# Patient Record
Sex: Female | Born: 1935 | Race: White | Hispanic: No | Smoking: Never smoker
Health system: Southern US, Community
[De-identification: ages and names within clinical notes are randomized; demographics above are authoritative.]

## PROBLEM LIST (undated history)

## (undated) DIAGNOSIS — N183 Chronic kidney disease, stage 3 unspecified: Secondary | ICD-10-CM

## (undated) DIAGNOSIS — N289 Disorder of kidney and ureter, unspecified: Secondary | ICD-10-CM

## (undated) DIAGNOSIS — K589 Irritable bowel syndrome without diarrhea: Secondary | ICD-10-CM

## (undated) DIAGNOSIS — I10 Essential (primary) hypertension: Secondary | ICD-10-CM

## (undated) DIAGNOSIS — E079 Disorder of thyroid, unspecified: Secondary | ICD-10-CM

## (undated) DIAGNOSIS — K5792 Diverticulitis of intestine, part unspecified, without perforation or abscess without bleeding: Secondary | ICD-10-CM

## (undated) DIAGNOSIS — I2510 Atherosclerotic heart disease of native coronary artery without angina pectoris: Secondary | ICD-10-CM

## (undated) DIAGNOSIS — D649 Anemia, unspecified: Secondary | ICD-10-CM

## (undated) DIAGNOSIS — K219 Gastro-esophageal reflux disease without esophagitis: Secondary | ICD-10-CM

## (undated) DIAGNOSIS — G4730 Sleep apnea, unspecified: Secondary | ICD-10-CM

## (undated) DIAGNOSIS — I5032 Chronic diastolic (congestive) heart failure: Secondary | ICD-10-CM

## (undated) DIAGNOSIS — N1330 Unspecified hydronephrosis: Secondary | ICD-10-CM

## (undated) DIAGNOSIS — K5790 Diverticulosis of intestine, part unspecified, without perforation or abscess without bleeding: Secondary | ICD-10-CM

## (undated) HISTORY — PX: COLON SURGERY: SHX602

## (undated) HISTORY — PX: PELVIC FLOOR REPAIR: SHX2192

## (undated) HISTORY — DX: Diverticulosis of intestine, part unspecified, without perforation or abscess without bleeding: K57.90

## (undated) HISTORY — PX: HEMORRHOID SURGERY: SHX153

## (undated) HISTORY — DX: Unspecified hydronephrosis: N13.30

## (undated) HISTORY — DX: Atherosclerotic heart disease of native coronary artery without angina pectoris: I25.10

## (undated) HISTORY — DX: Chronic diastolic (congestive) heart failure: I50.32

---

## 1965-08-30 HISTORY — PX: HEMORRHOID SURGERY: SHX153

## 1969-08-30 HISTORY — PX: ABDOMINAL HYSTERECTOMY: SHX81

## 2007-07-23 DIAGNOSIS — D519 Vitamin B12 deficiency anemia, unspecified: Secondary | ICD-10-CM | POA: Insufficient documentation

## 2008-04-03 DIAGNOSIS — E785 Hyperlipidemia, unspecified: Secondary | ICD-10-CM | POA: Insufficient documentation

## 2008-08-30 HISTORY — PX: COLON SURGERY: SHX602

## 2012-03-14 DIAGNOSIS — N8110 Cystocele, unspecified: Secondary | ICD-10-CM | POA: Insufficient documentation

## 2013-03-06 DIAGNOSIS — F411 Generalized anxiety disorder: Secondary | ICD-10-CM | POA: Insufficient documentation

## 2013-03-23 DIAGNOSIS — M545 Low back pain, unspecified: Secondary | ICD-10-CM | POA: Insufficient documentation

## 2014-01-06 DIAGNOSIS — M4802 Spinal stenosis, cervical region: Secondary | ICD-10-CM | POA: Insufficient documentation

## 2014-12-03 DIAGNOSIS — E034 Atrophy of thyroid (acquired): Secondary | ICD-10-CM | POA: Insufficient documentation

## 2015-09-18 DIAGNOSIS — N390 Urinary tract infection, site not specified: Secondary | ICD-10-CM

## 2015-09-18 DIAGNOSIS — K219 Gastro-esophageal reflux disease without esophagitis: Secondary | ICD-10-CM

## 2015-09-18 DIAGNOSIS — I10 Essential (primary) hypertension: Secondary | ICD-10-CM | POA: Insufficient documentation

## 2015-09-18 DIAGNOSIS — E538 Deficiency of other specified B group vitamins: Secondary | ICD-10-CM | POA: Insufficient documentation

## 2015-09-18 HISTORY — DX: Urinary tract infection, site not specified: N39.0

## 2015-10-06 DIAGNOSIS — E039 Hypothyroidism, unspecified: Secondary | ICD-10-CM

## 2015-10-06 HISTORY — DX: Hypothyroidism, unspecified: E03.9

## 2016-02-22 DIAGNOSIS — I160 Hypertensive urgency: Secondary | ICD-10-CM

## 2016-02-22 HISTORY — DX: Hypertensive urgency: I16.0

## 2016-02-23 DIAGNOSIS — K5790 Diverticulosis of intestine, part unspecified, without perforation or abscess without bleeding: Secondary | ICD-10-CM | POA: Insufficient documentation

## 2016-02-23 DIAGNOSIS — N183 Chronic kidney disease, stage 3 unspecified: Secondary | ICD-10-CM | POA: Diagnosis present

## 2016-02-23 DIAGNOSIS — R1084 Generalized abdominal pain: Secondary | ICD-10-CM | POA: Insufficient documentation

## 2016-02-23 DIAGNOSIS — R072 Precordial pain: Secondary | ICD-10-CM

## 2016-02-23 DIAGNOSIS — R42 Dizziness and giddiness: Secondary | ICD-10-CM | POA: Insufficient documentation

## 2016-02-23 HISTORY — DX: Precordial pain: R07.2

## 2019-08-03 ENCOUNTER — Emergency Department (HOSPITAL_COMMUNITY): Payer: Medicare Other

## 2019-08-03 ENCOUNTER — Inpatient Hospital Stay (HOSPITAL_COMMUNITY)
Admission: EM | Admit: 2019-08-03 | Discharge: 2019-08-07 | DRG: 392 | Disposition: A | Discharge status: Home / Self Care | Payer: Medicare Other | Attending: Family Medicine | Admitting: Family Medicine

## 2019-08-03 ENCOUNTER — Other Ambulatory Visit: Payer: Self-pay

## 2019-08-03 ENCOUNTER — Encounter (HOSPITAL_COMMUNITY): Payer: Self-pay | Admitting: Emergency Medicine

## 2019-08-03 DIAGNOSIS — Z20828 Contact with and (suspected) exposure to other viral communicable diseases: Secondary | ICD-10-CM | POA: Diagnosis present

## 2019-08-03 DIAGNOSIS — K219 Gastro-esophageal reflux disease without esophagitis: Secondary | ICD-10-CM | POA: Diagnosis not present

## 2019-08-03 DIAGNOSIS — N1832 Chronic kidney disease, stage 3b: Secondary | ICD-10-CM | POA: Diagnosis not present

## 2019-08-03 DIAGNOSIS — Z90710 Acquired absence of both cervix and uterus: Secondary | ICD-10-CM

## 2019-08-03 DIAGNOSIS — K5732 Diverticulitis of large intestine without perforation or abscess without bleeding: Secondary | ICD-10-CM | POA: Diagnosis not present

## 2019-08-03 DIAGNOSIS — E079 Disorder of thyroid, unspecified: Secondary | ICD-10-CM

## 2019-08-03 DIAGNOSIS — K5792 Diverticulitis of intestine, part unspecified, without perforation or abscess without bleeding: Secondary | ICD-10-CM | POA: Diagnosis not present

## 2019-08-03 DIAGNOSIS — E039 Hypothyroidism, unspecified: Secondary | ICD-10-CM | POA: Diagnosis present

## 2019-08-03 DIAGNOSIS — Z79890 Hormone replacement therapy (postmenopausal): Secondary | ICD-10-CM

## 2019-08-03 DIAGNOSIS — N390 Urinary tract infection, site not specified: Secondary | ICD-10-CM | POA: Diagnosis present

## 2019-08-03 DIAGNOSIS — I129 Hypertensive chronic kidney disease with stage 1 through stage 4 chronic kidney disease, or unspecified chronic kidney disease: Secondary | ICD-10-CM | POA: Diagnosis present

## 2019-08-03 DIAGNOSIS — N183 Chronic kidney disease, stage 3 unspecified: Secondary | ICD-10-CM | POA: Diagnosis present

## 2019-08-03 DIAGNOSIS — K589 Irritable bowel syndrome without diarrhea: Secondary | ICD-10-CM | POA: Diagnosis present

## 2019-08-03 DIAGNOSIS — I10 Essential (primary) hypertension: Secondary | ICD-10-CM

## 2019-08-03 DIAGNOSIS — R1030 Lower abdominal pain, unspecified: Secondary | ICD-10-CM | POA: Diagnosis not present

## 2019-08-03 HISTORY — DX: Chronic kidney disease, stage 3 unspecified: N18.30

## 2019-08-03 HISTORY — DX: Irritable bowel syndrome, unspecified: K58.9

## 2019-08-03 HISTORY — DX: Sleep apnea, unspecified: G47.30

## 2019-08-03 HISTORY — DX: Gastro-esophageal reflux disease without esophagitis: K21.9

## 2019-08-03 HISTORY — DX: Disorder of thyroid, unspecified: E07.9

## 2019-08-03 HISTORY — DX: Diverticulitis of intestine, part unspecified, without perforation or abscess without bleeding: K57.92

## 2019-08-03 HISTORY — DX: Anemia, unspecified: D64.9

## 2019-08-03 HISTORY — DX: Disorder of kidney and ureter, unspecified: N28.9

## 2019-08-03 HISTORY — DX: Essential (primary) hypertension: I10

## 2019-08-03 LAB — URINALYSIS, ROUTINE W REFLEX MICROSCOPIC
Bacteria, UA: NONE SEEN
Bilirubin Urine: NEGATIVE
Glucose, UA: NEGATIVE mg/dL
Ketones, ur: NEGATIVE mg/dL
Nitrite: NEGATIVE
Protein, ur: NEGATIVE mg/dL
Specific Gravity, Urine: 1.005 (ref 1.005–1.030)
pH: 6 (ref 5.0–8.0)

## 2019-08-03 LAB — LIPASE, BLOOD: Lipase: 29 U/L (ref 11–51)

## 2019-08-03 LAB — TSH: TSH: 15.31 u[IU]/mL — ABNORMAL HIGH (ref 0.350–4.500)

## 2019-08-03 LAB — CBC WITH DIFFERENTIAL/PLATELET
Abs Immature Granulocytes: 0.02 10*3/uL (ref 0.00–0.07)
Basophils Absolute: 0 10*3/uL (ref 0.0–0.1)
Basophils Relative: 0 %
Eosinophils Absolute: 0 10*3/uL (ref 0.0–0.5)
Eosinophils Relative: 0 %
HCT: 44.5 % (ref 36.0–46.0)
Hemoglobin: 13.5 g/dL (ref 12.0–15.0)
Immature Granulocytes: 0 %
Lymphocytes Relative: 15 %
Lymphs Abs: 1.3 10*3/uL (ref 0.7–4.0)
MCH: 28.1 pg (ref 26.0–34.0)
MCHC: 30.3 g/dL (ref 30.0–36.0)
MCV: 92.7 fL (ref 80.0–100.0)
Monocytes Absolute: 0.6 10*3/uL (ref 0.1–1.0)
Monocytes Relative: 7 %
Neutro Abs: 7.1 10*3/uL (ref 1.7–7.7)
Neutrophils Relative %: 78 %
Platelets: 280 10*3/uL (ref 150–400)
RBC: 4.8 MIL/uL (ref 3.87–5.11)
RDW: 13.7 % (ref 11.5–15.5)
WBC: 9.2 10*3/uL (ref 4.0–10.5)
nRBC: 0 % (ref 0.0–0.2)

## 2019-08-03 LAB — COMPREHENSIVE METABOLIC PANEL
ALT: 16 U/L (ref 0–44)
AST: 23 U/L (ref 15–41)
Albumin: 4.1 g/dL (ref 3.5–5.0)
Alkaline Phosphatase: 71 U/L (ref 38–126)
Anion gap: 12 (ref 5–15)
BUN: 17 mg/dL (ref 8–23)
CO2: 24 mmol/L (ref 22–32)
Calcium: 9.4 mg/dL (ref 8.9–10.3)
Chloride: 99 mmol/L (ref 98–111)
Creatinine, Ser: 1.34 mg/dL — ABNORMAL HIGH (ref 0.44–1.00)
GFR calc Af Amer: 43 mL/min — ABNORMAL LOW (ref >60)
GFR calc non Af Amer: 37 mL/min — ABNORMAL LOW (ref >60)
Glucose, Bld: 118 mg/dL — ABNORMAL HIGH (ref 70–99)
Potassium: 3.6 mmol/L (ref 3.5–5.1)
Sodium: 135 mmol/L (ref 135–145)
Total Bilirubin: 0.9 mg/dL (ref 0.3–1.2)
Total Protein: 8 g/dL (ref 6.5–8.1)

## 2019-08-03 LAB — MAGNESIUM: Magnesium: 2.1 mg/dL (ref 1.7–2.4)

## 2019-08-03 LAB — PHOSPHORUS: Phosphorus: 3.2 mg/dL (ref 2.5–4.6)

## 2019-08-03 MED ORDER — MORPHINE SULFATE (PF) 2 MG/ML IV SOLN
2.0000 mg | Freq: Once | INTRAVENOUS | Status: AC
  Administered 2019-08-03: 2 mg via INTRAVENOUS
  Filled 2019-08-03: qty 1

## 2019-08-03 MED ORDER — LOSARTAN POTASSIUM 50 MG PO TABS
50.0000 mg | ORAL_TABLET | Freq: Two times a day (BID) | ORAL | Status: DC
  Administered 2019-08-04 – 2019-08-07 (×6): 50 mg via ORAL
  Filled 2019-08-03 (×8): qty 1

## 2019-08-03 MED ORDER — FAMOTIDINE 20 MG PO TABS
20.0000 mg | ORAL_TABLET | Freq: Every day | ORAL | Status: DC
  Administered 2019-08-03 – 2019-08-06 (×4): 20 mg via ORAL
  Filled 2019-08-03 (×4): qty 1

## 2019-08-03 MED ORDER — POTASSIUM CHLORIDE IN NACL 20-0.9 MEQ/L-% IV SOLN
INTRAVENOUS | Status: DC
  Administered 2019-08-03 – 2019-08-04 (×3): via INTRAVENOUS

## 2019-08-03 MED ORDER — SODIUM CHLORIDE 0.9 % IV BOLUS
500.0000 mL | Freq: Once | INTRAVENOUS | Status: AC
  Administered 2019-08-03: 500 mL via INTRAVENOUS

## 2019-08-03 MED ORDER — ENOXAPARIN SODIUM 30 MG/0.3ML ~~LOC~~ SOLN
30.0000 mg | SUBCUTANEOUS | Status: DC
  Administered 2019-08-03: 30 mg via SUBCUTANEOUS
  Filled 2019-08-03: qty 0.3

## 2019-08-03 MED ORDER — LEVOTHYROXINE SODIUM 88 MCG PO TABS
88.0000 ug | ORAL_TABLET | Freq: Every day | ORAL | Status: DC
  Administered 2019-08-04: 88 ug via ORAL
  Filled 2019-08-03: qty 1

## 2019-08-03 MED ORDER — METRONIDAZOLE IN NACL 5-0.79 MG/ML-% IV SOLN
500.0000 mg | Freq: Once | INTRAVENOUS | Status: AC
  Administered 2019-08-03: 500 mg via INTRAVENOUS
  Filled 2019-08-03: qty 100

## 2019-08-03 MED ORDER — FENTANYL CITRATE (PF) 100 MCG/2ML IJ SOLN
50.0000 ug | Freq: Once | INTRAMUSCULAR | Status: AC
  Administered 2019-08-03: 50 ug via INTRAVENOUS
  Filled 2019-08-03: qty 2

## 2019-08-03 MED ORDER — IOHEXOL 9 MG/ML PO SOLN
ORAL | Status: AC
  Filled 2019-08-03: qty 1000

## 2019-08-03 MED ORDER — SENNOSIDES-DOCUSATE SODIUM 8.6-50 MG PO TABS
1.0000 | ORAL_TABLET | Freq: Two times a day (BID) | ORAL | Status: DC
  Filled 2019-08-03 (×9): qty 1

## 2019-08-03 MED ORDER — SODIUM CHLORIDE 0.9 % IV SOLN: INTRAVENOUS | Status: DC

## 2019-08-03 MED ORDER — POLYETHYLENE GLYCOL 3350 17 G PO PACK
17.0000 g | PACK | Freq: Every day | ORAL | Status: DC
  Filled 2019-08-03 (×4): qty 1

## 2019-08-03 MED ORDER — ACETAMINOPHEN 650 MG RE SUPP: 650.0000 mg | Freq: Four times a day (QID) | RECTAL | Status: DC | PRN

## 2019-08-03 MED ORDER — ACETAMINOPHEN 325 MG PO TABS: 650.0000 mg | ORAL_TABLET | Freq: Four times a day (QID) | ORAL | Status: DC | PRN

## 2019-08-03 MED ORDER — SODIUM CHLORIDE 0.9 % IV SOLN
3.0000 g | Freq: Three times a day (TID) | INTRAVENOUS | Status: DC
  Administered 2019-08-03 – 2019-08-07 (×11): 3 g via INTRAVENOUS
  Filled 2019-08-03: qty 3
  Filled 2019-08-03 (×3): qty 8
  Filled 2019-08-03 (×2): qty 3
  Filled 2019-08-03 (×3): qty 8
  Filled 2019-08-03: qty 3
  Filled 2019-08-03: qty 8
  Filled 2019-08-03: qty 3
  Filled 2019-08-03 (×3): qty 8

## 2019-08-03 MED ORDER — CIPROFLOXACIN IN D5W 400 MG/200ML IV SOLN
400.0000 mg | Freq: Once | INTRAVENOUS | Status: AC
  Administered 2019-08-03: 400 mg via INTRAVENOUS
  Filled 2019-08-03: qty 200

## 2019-08-03 MED ORDER — DILTIAZEM HCL ER 120 MG PO CP24
120.0000 mg | ORAL_CAPSULE | Freq: Every day | ORAL | Status: DC
  Administered 2019-08-03 – 2019-08-06 (×4): 120 mg via ORAL
  Filled 2019-08-03 (×11): qty 1

## 2019-08-03 MED ORDER — MORPHINE SULFATE (PF) 2 MG/ML IV SOLN: 2.0000 mg | INTRAVENOUS | Status: DC | PRN

## 2019-08-03 MED ORDER — FENTANYL CITRATE (PF) 100 MCG/2ML IJ SOLN
50.0000 ug | INTRAMUSCULAR | Status: DC | PRN
  Administered 2019-08-03 – 2019-08-04 (×8): 50 ug via INTRAVENOUS
  Filled 2019-08-03 (×8): qty 2

## 2019-08-03 MED ORDER — ONDANSETRON HCL 4 MG/2ML IJ SOLN
4.0000 mg | Freq: Four times a day (QID) | INTRAMUSCULAR | Status: DC | PRN
  Administered 2019-08-03 – 2019-08-04 (×2): 4 mg via INTRAVENOUS
  Filled 2019-08-03 (×3): qty 2

## 2019-08-03 MED ORDER — ONDANSETRON HCL 4 MG/2ML IJ SOLN
4.0000 mg | Freq: Once | INTRAMUSCULAR | Status: AC
  Administered 2019-08-03: 4 mg via INTRAVENOUS
  Filled 2019-08-03: qty 2

## 2019-08-03 NOTE — ED Provider Notes (Signed)
[facility] EMERGENCY DEPARTMENT Provider Note   CSN: [id number] Arrival date & time: 08/03/19  [id number]     History   Chief Complaint Chief Complaint  Patient presents with   Abdominal Pain    HPI Kristy CosierJoan Patterson is a 83 y.o. female with a history of GERD, hypertension, IBS which is constipation predominant, stage III kidney disease and history of diverticulitis, surgical history significant for a rectal revision and hemorrhoid surgery presenting with a 1 week history of increased constipation and bilateral lower abdominal pain.  She reports her last bowel movement was 1 week ago.  She denies rectal pain or pressure but reports pressure across her bilateral lower abdomen with intermittent sharp stabs of pain radiating from right to left.  It became more intense last night, stating has a sharp stab occurring frequently and lasting about a minute.  She denies fevers or chills, she does have nausea without emesis.  She has had no p.o. intake in the past 24 hours due to lack of appetite.  She typically will take a dose of sorbitol as needed for constipation as recommended by her GI specialist in [place].  She has had multiple doses of this this week without any relief.     The history is provided by the patient.    Past Medical History:  Diagnosis Date   Diverticulitis    GERD (gastroesophageal reflux disease)    Hypertension    Irritable bowel syndrome (IBS)    Renal disorder    Sleep apnea    Stage 3 chronic kidney disease    Thyroid disease     There are no active problems to display for this patient.      OB History   No obstetric history on file.      Home Medications    Prior to Admission medications   Not on File    Family History No family history on file.  Social History Social History   Tobacco Use   Smoking status: Never Smoker   Smokeless tobacco: Never Used  Substance Use Topics   Alcohol use: Never    Frequency: Never   Drug use: Never       Allergies   Atropine, Amlodipine, and Compazine [prochlorperazine edisylate]   Review of Systems Review of Systems  Constitutional: Negative for chills and fever.  HENT: Negative for congestion and sore throat.   Eyes: Negative.   Respiratory: Negative for chest tightness and shortness of breath.   Cardiovascular: Negative for chest pain.  Gastrointestinal: Positive for abdominal pain, constipation and nausea. Negative for blood in stool and vomiting.  Genitourinary: Negative.  Negative for dysuria.  Musculoskeletal: Negative for arthralgias, joint swelling and neck pain.  Skin: Negative.  Negative for rash and wound.  Neurological: Negative for dizziness, weakness, light-headedness, numbness and headaches.  Psychiatric/Behavioral: Negative.      Physical Exam Updated Vital Signs BP (!) 172/70 (BP Location: Right Arm)    Pulse (!) 107    Temp 98.6 F (37 C) (Oral)    Resp 18    Ht 5\' 3"  (1.6 m)    Wt 75.8 kg    SpO2 98%    BMI 29.58 kg/m   Physical Exam Vitals signs and nursing note reviewed.  Constitutional:      Appearance: She is well-developed.  HENT:     Head: Normocephalic and atraumatic.  Eyes:     Conjunctiva/sclera: Conjunctivae normal.  Neck:     Musculoskeletal: Normal range of motion.  Cardiovascular:  Rate and Rhythm: Normal rate and regular rhythm.     Heart sounds: Normal heart sounds.  Pulmonary:     Effort: Pulmonary effort is normal.     Breath sounds: Normal breath sounds. No wheezing.  Abdominal:     General: Bowel sounds are normal. There is distension.     Palpations: Abdomen is soft.     Tenderness: There is abdominal tenderness in the right lower quadrant and left lower quadrant. There is no guarding or rebound.  Musculoskeletal: Normal range of motion.  Skin:    General: Skin is warm and dry.  Neurological:     Mental Status: She is alert.      ED Treatments / Results  Labs (all labs ordered are listed, but only abnormal  results are displayed) Labs Reviewed  COMPREHENSIVE METABOLIC PANEL - Abnormal; Notable for the following components:      Result Value   Glucose, Bld 118 (*)    Creatinine, Ser 1.34 (*)    GFR calc non Af Amer 37 (*)    GFR calc Af Amer 43 (*)    All other components within normal limits  URINALYSIS, ROUTINE W REFLEX MICROSCOPIC - Abnormal; Notable for the following components:   Hgb urine dipstick SMALL (*)    Leukocytes,Ua SMALL (*)    All other components within normal limits  LIPASE, BLOOD  CBC WITH DIFFERENTIAL/PLATELET    EKG None  Radiology Ct Abdomen Pelvis Wo Contrast  Result Date: 08/03/2019 CLINICAL DATA:  Lower abdominal pain and nausea for 1 week. Renal insufficiency. Constipation. History of pelvic floor repair and colonic surgery. EXAM: CT ABDOMEN AND PELVIS WITHOUT CONTRAST TECHNIQUE: Multidetector CT imaging of the abdomen and pelvis was performed following the standard protocol without IV contrast. COMPARISON:  Abdominal radiographs from earlier today. FINDINGS: Lower chest: No significant pulmonary nodules or acute consolidative airspace disease. Hepatobiliary: Normal liver size. No liver mass. Normal gallbladder with no radiopaque cholelithiasis. No biliary ductal dilatation. Pancreas: Normal, with no mass or duct dilation. Spleen: Normal size. No mass. Adrenals/Urinary Tract: Normal adrenals. No renal stones. No hydronephrosis. Small parapelvic renal cysts in the left kidney. Simple left renal cysts, largest an exophytic 2.7 cm simple medial upper left renal cyst. No additional contour deforming renal lesions. Normal bladder. Stomach/Bowel: Normal non-distended stomach. Normal caliber small bowel with no small bowel wall thickening. Normal appendix. Oral contrast transits to the left colon. Moderate diffuse colonic diverticulosis. There is a short segment eccentric wall thickening in mid to distal sigmoid colon with surrounding ill-defined fluid and fat stranding,  compatible with acute sigmoid diverticulitis. A few tiny foci of pericolonic free air are noted. No discrete abscess on this noncontrast scan. Vascular/Lymphatic: Atherosclerotic nonaneurysmal abdominal aorta. No pathologically enlarged lymph nodes in the abdomen or pelvis. Reproductive: Status post hysterectomy, with no abnormal findings at the vaginal cuff. No adnexal mass. Other: No ascites.  No focal fluid collection. Musculoskeletal: No aggressive appearing focal osseous lesions. IMPRESSION: 1. Acute diverticulitis in the mid to distal sigmoid colon. Tiny foci of pericolonic free air indicative of microperforation. Associated pericolonic inflammatory changes without discrete abscess on this scan limited by noncontrast technique. 2.  Aortic Atherosclerosis (ICD10-I70.0). Electronically Signed   By: Ilona Sorrel M.D.   On: 08/03/2019 12:18   Dg Abdomen 1 View  Result Date: 08/03/2019 CLINICAL DATA:  Lower abdominal pain and constipation 2 weeks. IBS. History of colon surgery. EXAM: ABDOMEN - 1 VIEW COMPARISON:  None. FINDINGS: Bowel gas pattern is nonobstructive. No  free peritoneal air. Mild degenerate change of the spine and hips. Several pelvic phleboliths are present. IMPRESSION: Nonobstructive bowel gas pattern. Electronically Signed   By: Elberta Fortis M.D.   On: 08/03/2019 08:58    Procedures Procedures (including critical care time)  Medications Ordered in ED Medications  iohexol (OMNIPAQUE) 9 MG/ML oral solution (has no administration in time range)  fentaNYL (SUBLIMAZE) injection 50 mcg (has no administration in time range)  ciprofloxacin (CIPRO) IVPB 400 mg (has no administration in time range)  metroNIDAZOLE (FLAGYL) IVPB 500 mg (has no administration in time range)  sodium chloride 0.9 % bolus 500 mL (0 mLs Intravenous Stopped 08/03/19 0953)  ondansetron (ZOFRAN) injection 4 mg (4 mg Intravenous Given 08/03/19 0830)  morphine 2 MG/ML injection 2 mg (2 mg Intravenous Given 08/03/19 0830)   fentaNYL (SUBLIMAZE) injection 50 mcg (50 mcg Intravenous Given 08/03/19 1008)     Initial Impression / Assessment and Plan / ED Course  I have reviewed the triage vital signs and the nursing notes.  Pertinent labs & imaging results that were available during my care of the patient were reviewed by me and considered in my medical decision making (see chart for details).       Pt's labs and imaging reviewed and discussed with pt.  At re-exam, pt reports escalating pain. Fentanyl dose reordered.  Discussed CT imaging results and tx plan.  She divulged at that time that she had some "leftover" cipro prescribed by her GI specialist in [place], and has taken 1 daily for the past 5 days.  Partial outpt tx but with escalating sx.  Pt would benefit from admission/IV abx and pain control.  Discussed with Dr. Gwenlyn Perking who agrees with admission/prob overnight obs then reassess.   Final Clinical Impressions(s) / ED Diagnoses   Final diagnoses:  Acute diverticulitis    ED Discharge Orders    None       Victoriano Lain 08/03/19 1351    Milagros Loll, MD 08/03/19 1910

## 2019-08-03 NOTE — Progress Notes (Signed)
Pharmacy Antibiotic Note  Kristy Patterson is a 83 y.o. female admitted on 08/03/2019 with intra-abdominal infection.  Pharmacy has been consulted for Unasyn dosing.  She received Cipro/Flagyl in the ED.  Plan: Unasyn 3000 mg IV every 8 hours  Monitor labs, c/s, and patient improvement.   Height: 5\' 3"  (160 cm) Weight: 167 lb (75.8 kg) IBW/kg (Calculated) : 52.4  Temp (24hrs), Avg:98.6 F (37 C), Min:98.6 F (37 C), Max:98.6 F (37 C)  Recent Labs  Lab 08/03/19 0738  WBC 9.2  CREATININE 1.34*    Estimated Creatinine Clearance: 32.1 mL/min (A) (by C-G formula based on SCr of 1.34 mg/dL (H)).    Allergies  Allergen Reactions  . Atropine Swelling    Dried me out   . Amlodipine Palpitations  . Compazine [Prochlorperazine Edisylate] Anxiety    Gets the shakes     Antimicrobials this admission: Unasyn 12/4 >>  Cipro/Flagyl 12/4 x 1 dose    Microbiology results: COVID: pending  Thank you for allowing pharmacy to be a part of this patient's care.  Ramond Craver 08/03/2019 2:09 PM

## 2019-08-03 NOTE — ED Notes (Signed)
Patient transported to CT 

## 2019-08-03 NOTE — Progress Notes (Signed)
Had had no vomiting today until clear supper tray and vomited.  Just gave more compazine and enema .  Texted Dr. Dyann Kief

## 2019-08-03 NOTE — H&P (Signed)
History and Physical    Kristy CosierJoan Severe ZOX:[id number] DOB: 11/11/1935 DOA: 08/03/2019  Referring MD/NP/PA: Burgess AmorJulie Idol, PA PCP: Freddy FinnerMills, Hannah M, NP  Patient coming from: Home  Chief Complaint: Abdominal pain and nausea  HPI: Kristy Patterson is a 83 y.o. female with a past medical history significant for diverticulosis, irritable bowel syndrome, hypertension, gastroesophageal reflux disease, thyroid disease and chronic kidney disease a stage IIIb; who presented to the hospital secondary to left lower quadrant abdominal pain and nausea.  Patient reports symptom has been present for the last 2-3 days and worsening.  She expressed pain is stabbing, crampy, 7 out of 10 in intensity and radiated across her lower abdomen.  Patient reported associated nausea and anorexia but no vomiting.  There has not been any fever, chills, chest pain, shortness of breath, melena, hematochezia, dysuria, hematuria, headaches, focal weakness, decreased days or sense of smell, no contact with any known positive Covid patient.  In the ED work-up demonstrated CT abdomen with sigmoid diverticulitis and contained microperforation; normal WBCs and a stable basic metabolic panel with a creatinine of 1.34.  IV antibiotics, pain medications and antiemetics were provided in the ED along with fluid resuscitation.  TR[facility] has been consulted to further manage acute diverticulitis with concerns of inability to tolerate oral medications with active nausea and also severe pain requiring IV pain management.  Past Medical/Surgical History: Past Medical History:  Diagnosis Date  . Anemia   . Diverticulitis   . GERD (gastroesophageal reflux disease)   . Hypertension   . Irritable bowel syndrome (IBS)   . Renal disorder   . Sleep apnea   . Stage 3 chronic kidney disease   . Thyroid disease     Past Surgical History:  Procedure Laterality Date  . COLON SURGERY    . HEMORRHOID SURGERY    . PELVIC FLOOR REPAIR      Social History:  reports  that she has never smoked. She has never used smokeless tobacco. She reports that she does not drink alcohol or use drugs.  Allergies: Allergies  Allergen Reactions  . Atropine Swelling    Dried me out   . Amlodipine Palpitations  . Compazine [Prochlorperazine Edisylate] Anxiety    Gets the shakes     Family History:  Significant for hypertension; otherwise negative and noncontributory.   Prior to Admission medications   Medication Sig Start Date End Date Taking? Authorizing Provider  chlorthalidone (HYGROTON) 25 MG tablet Take 1 tablet by mouth daily. 08/31/16  Yes [provider]  diltiazem (DILACOR XR) 120 MG 24 hr capsule Take 1 capsule by mouth daily. 04/18/18  Yes [provider]  Levothyroxine Sodium 88 MCG CAPS Take 1 tablet by mouth daily.  02/23/16  Yes [provider]  losartan (COZAAR) 50 MG tablet Take 1 tablet by mouth 2 (two) times daily. 09/18/15  Yes [provider]    Review of Systems:  Negative except as otherwise mentioned in HPI.   Physical Exam: Vitals:   08/03/19 0733 08/03/19 0740 08/03/19 1401  BP:  (!) 172/70 (!) 147/83  Pulse:  (!) 107 88  Resp:  18 16  Temp:  98.6 F (37 C)   TempSrc:  Oral   SpO2:  98% 100%  Weight: 75.8 kg    Height: 5\' 3"  (1.6 m)      Constitutional: Calm, afebrile, denying chest pain or shortness of breath.  Patient reports nausea and ongoing left lower quadrant abdominal discomfort. Eyes: PERRL, lids and conjunctivae normal,  no icterus, no nystagmus. ENMT: Mucous membranes are moist. Posterior pharynx clear of any exudate or lesions. Neck: normal, supple, no masses, no thyromegaly, no JVD. Respiratory: clear to auscultation bilaterally, no wheezing, no crackles. Normal respiratory effort. No accessory muscle use.  Cardiovascular: Regular rate and rhythm, no murmurs / rubs / gallops. No extremity edema. 2+ pedal pulses. No carotid bruits.  Abdomen: Tender to palpation in the left lower  quadrant, also complaining of swollen rebound tenderness in her right lower quadrant.  Positive bowel sounds, soft, no guarding.  No distention.   Musculoskeletal: no clubbing / cyanosis. No joint deformity upper and lower extremities. Good ROM, no contractures. Normal muscle tone.  Skin: no rashes, lesions, ulcers. No induration Neurologic: CN 2-12 grossly intact. Sensation intact, DTR normal.  Able to move all 4 limbs spontaneously; muscle strength 4 out of 5 bilaterally, in the setting of poor effort. Psychiatric: Normal judgment and insight. Alert and oriented x 3. Normal mood.    Labs on Admission: I have personally reviewed the following labs and imaging studies  CBC: Recent Labs  Lab 08/03/19 0738  WBC 9.2  NEUTROABS 7.1  HGB 13.5  HCT 44.5  MCV 92.7  PLT 280   Basic Metabolic Panel: Recent Labs  Lab 08/03/19 0738  NA 135  K 3.6  CL 99  CO2 24  GLUCOSE 118*  BUN 17  CREATININE 1.34*  CALCIUM 9.4   GFR: Estimated Creatinine Clearance: 32.1 mL/min (A) (by C-G formula based on SCr of 1.34 mg/dL (H)).   Liver Function Tests: Recent Labs  Lab 08/03/19 0738  AST 23  ALT 16  ALKPHOS 71  BILITOT 0.9  PROT 8.0  ALBUMIN 4.1   Recent Labs  Lab 08/03/19 0738  LIPASE 29   Urine analysis:    Component Value Date/Time   COLORURINE YELLOW 08/03/2019 0738   APPEARANCEUR CLEAR 08/03/2019 0738   LABSPEC 1.005 08/03/2019 0738   PHURINE 6.0 08/03/2019 0738   GLUCOSEU NEGATIVE 08/03/2019 0738   HGBUR SMALL (A) 08/03/2019 0738   BILIRUBINUR NEGATIVE 08/03/2019 0738   KETONESUR NEGATIVE 08/03/2019 0738   PROTEINUR NEGATIVE 08/03/2019 0738   NITRITE NEGATIVE 08/03/2019 0738   LEUKOCYTESUR SMALL (A) 08/03/2019 0738   Radiological Exams on Admission: Ct Abdomen Pelvis Wo Contrast  Result Date: 08/03/2019 CLINICAL DATA:  Lower abdominal pain and nausea for 1 week. Renal insufficiency. Constipation. History of pelvic floor repair and colonic surgery. EXAM: CT ABDOMEN  AND PELVIS WITHOUT CONTRAST TECHNIQUE: Multidetector CT imaging of the abdomen and pelvis was performed following the standard protocol without IV contrast. COMPARISON:  Abdominal radiographs from earlier today. FINDINGS: Lower chest: No significant pulmonary nodules or acute consolidative airspace disease. Hepatobiliary: Normal liver size. No liver mass. Normal gallbladder with no radiopaque cholelithiasis. No biliary ductal dilatation. Pancreas: Normal, with no mass or duct dilation. Spleen: Normal size. No mass. Adrenals/Urinary Tract: Normal adrenals. No renal stones. No hydronephrosis. Small parapelvic renal cysts in the left kidney. Simple left renal cysts, largest an exophytic 2.7 cm simple medial upper left renal cyst. No additional contour deforming renal lesions. Normal bladder. Stomach/Bowel: Normal non-distended stomach. Normal caliber small bowel with no small bowel wall thickening. Normal appendix. Oral contrast transits to the left colon. Moderate diffuse colonic diverticulosis. There is a short segment eccentric wall thickening in mid to distal sigmoid colon with surrounding ill-defined fluid and fat stranding, compatible with acute sigmoid diverticulitis. A few tiny foci of pericolonic free air are noted. No discrete abscess on this  noncontrast scan. Vascular/Lymphatic: Atherosclerotic nonaneurysmal abdominal aorta. No pathologically enlarged lymph nodes in the abdomen or pelvis. Reproductive: Status post hysterectomy, with no abnormal findings at the vaginal cuff. No adnexal mass. Other: No ascites.  No focal fluid collection. Musculoskeletal: No aggressive appearing focal osseous lesions. IMPRESSION: 1. Acute diverticulitis in the mid to distal sigmoid colon. Tiny foci of pericolonic free air indicative of microperforation. Associated pericolonic inflammatory changes without discrete abscess on this scan limited by noncontrast technique. 2.  Aortic Atherosclerosis (ICD10-I70.0). Electronically  Signed   By: Ilona Sorrel M.D.   On: 08/03/2019 12:18   Dg Abdomen 1 View  Result Date: 08/03/2019 CLINICAL DATA:  Lower abdominal pain and constipation 2 weeks. IBS. History of colon surgery. EXAM: ABDOMEN - 1 VIEW COMPARISON:  None. FINDINGS: Bowel gas pattern is nonobstructive. No free peritoneal air. Mild degenerate change of the spine and hips. Several pelvic phleboliths are present. IMPRESSION: Nonobstructive bowel gas pattern. Electronically Signed   By: Marin Olp M.D.   On: 08/03/2019 08:58    EKG: None  Assessment/Plan 1-acute diverticulitis of intestine -With microperforation, contain and without active abscess or hemorrhage. -Patient will be started on IV Unasyn -Limited bowel rest with clear liquid diet -IV fluid and electrolyte resuscitation as needed. -Continue IV as needed pain medications -As needed antiemetics. -Follow clinical response.  2-Thyroid disease -Will check TSH -Continue Synthroid.  3-GERD (gastroesophageal reflux disease) -Will use Pepcid for symptomatic management.  4-Chronic renal failure, stage 3b -Creatinine appears to be stable and at baseline -Minimize nephrotoxic agents, provide IV fluid resuscitation and follow renal function trend.  5-abdominal pain/anorexia -In the setting of acute diverticulitis process -Will provide IV pain medication and antibiotics as mentioned above -Slowly advance diet as tolerated -As needed antiemetics will be also ordered.  6-essential hypertension -Continue diltiazem and Cozaar -Follow vital signs -Will hold diuretics while actively providing fluid resuscitation. -Follow vital signs.  DVT prophylaxis: Lovenox. Code Status: Full code. Family Communication: No family at bedside. Disposition Plan: Anticipate discharge back home once acute diverticulitis process is controlled.  No further nausea and demonstrated ability to tolerate p.o.'s and oral medication. Consults called: None Admission status:  Length of stay less than 2 midnights; MedSurg bed, observation status.  Anticipate patient will receive significant improvement with anticipation discharge back home on 08/04/2019.  In case that she remains experiencing significant nausea or abdominal pain unable to be otherwise treated as an outpatient will change her status to inpatient and continue treatment with IV antibiotics.   Time Spent: 70 minutes.  Barton Dubois MD [facility] Pager [phone]   08/03/2019, 4:30 PM

## 2019-08-03 NOTE — ED Triage Notes (Signed)
Lower abd pain x 1 week. Also reports constipation

## 2019-08-04 DIAGNOSIS — Z79890 Hormone replacement therapy (postmenopausal): Secondary | ICD-10-CM | POA: Diagnosis not present

## 2019-08-04 DIAGNOSIS — E039 Hypothyroidism, unspecified: Secondary | ICD-10-CM | POA: Diagnosis present

## 2019-08-04 DIAGNOSIS — R1030 Lower abdominal pain, unspecified: Secondary | ICD-10-CM | POA: Diagnosis present

## 2019-08-04 DIAGNOSIS — Z90710 Acquired absence of both cervix and uterus: Secondary | ICD-10-CM | POA: Diagnosis not present

## 2019-08-04 DIAGNOSIS — K5732 Diverticulitis of large intestine without perforation or abscess without bleeding: Secondary | ICD-10-CM | POA: Diagnosis present

## 2019-08-04 DIAGNOSIS — I129 Hypertensive chronic kidney disease with stage 1 through stage 4 chronic kidney disease, or unspecified chronic kidney disease: Secondary | ICD-10-CM | POA: Diagnosis present

## 2019-08-04 DIAGNOSIS — K5792 Diverticulitis of intestine, part unspecified, without perforation or abscess without bleeding: Secondary | ICD-10-CM | POA: Diagnosis not present

## 2019-08-04 DIAGNOSIS — N1832 Chronic kidney disease, stage 3b: Secondary | ICD-10-CM | POA: Diagnosis present

## 2019-08-04 DIAGNOSIS — Z20828 Contact with and (suspected) exposure to other viral communicable diseases: Secondary | ICD-10-CM | POA: Diagnosis present

## 2019-08-04 DIAGNOSIS — N390 Urinary tract infection, site not specified: Secondary | ICD-10-CM | POA: Diagnosis present

## 2019-08-04 DIAGNOSIS — K589 Irritable bowel syndrome without diarrhea: Secondary | ICD-10-CM | POA: Diagnosis present

## 2019-08-04 DIAGNOSIS — K219 Gastro-esophageal reflux disease without esophagitis: Secondary | ICD-10-CM | POA: Diagnosis present

## 2019-08-04 LAB — BASIC METABOLIC PANEL
Anion gap: 9 (ref 5–15)
BUN: 13 mg/dL (ref 8–23)
CO2: 23 mmol/L (ref 22–32)
Calcium: 8.5 mg/dL — ABNORMAL LOW (ref 8.9–10.3)
Chloride: 107 mmol/L (ref 98–111)
Creatinine, Ser: 1.26 mg/dL — ABNORMAL HIGH (ref 0.44–1.00)
GFR calc Af Amer: 46 mL/min — ABNORMAL LOW (ref >60)
GFR calc non Af Amer: 40 mL/min — ABNORMAL LOW (ref >60)
Glucose, Bld: 100 mg/dL — ABNORMAL HIGH (ref 70–99)
Potassium: 3.6 mmol/L (ref 3.5–5.1)
Sodium: 139 mmol/L (ref 135–145)

## 2019-08-04 LAB — CBC
HCT: 35.1 % — ABNORMAL LOW (ref 36.0–46.0)
Hemoglobin: 11 g/dL — ABNORMAL LOW (ref 12.0–15.0)
MCH: 28.4 pg (ref 26.0–34.0)
MCHC: 31.3 g/dL (ref 30.0–36.0)
MCV: 90.5 fL (ref 80.0–100.0)
Platelets: 251 10*3/uL (ref 150–400)
RBC: 3.88 MIL/uL (ref 3.87–5.11)
RDW: 13.8 % (ref 11.5–15.5)
WBC: 7.3 10*3/uL (ref 4.0–10.5)
nRBC: 0 % (ref 0.0–0.2)

## 2019-08-04 LAB — SARS CORONAVIRUS 2 (TAT 6-24 HRS): SARS Coronavirus 2: NEGATIVE

## 2019-08-04 MED ORDER — LABETALOL HCL 5 MG/ML IV SOLN: 10.0000 mg | INTRAVENOUS | Status: DC | PRN

## 2019-08-04 MED ORDER — LEVOTHYROXINE SODIUM 100 MCG PO TABS
100.0000 ug | ORAL_TABLET | Freq: Every day | ORAL | Status: DC
  Administered 2019-08-05 – 2019-08-07 (×3): 100 ug via ORAL
  Filled 2019-08-04 (×3): qty 1

## 2019-08-04 MED ORDER — OXYCODONE HCL 5 MG PO TABS
5.0000 mg | ORAL_TABLET | ORAL | Status: DC | PRN
  Administered 2019-08-04 (×2): 5 mg via ORAL
  Filled 2019-08-04 (×3): qty 1

## 2019-08-04 MED ORDER — SENNOSIDES-DOCUSATE SODIUM 8.6-50 MG PO TABS
2.0000 | ORAL_TABLET | Freq: Two times a day (BID) | ORAL | Status: DC
  Administered 2019-08-05: 2 via ORAL
  Filled 2019-08-04 (×5): qty 2

## 2019-08-04 MED ORDER — ENOXAPARIN SODIUM 40 MG/0.4ML ~~LOC~~ SOLN
40.0000 mg | SUBCUTANEOUS | Status: DC
  Administered 2019-08-04 – 2019-08-06 (×3): 40 mg via SUBCUTANEOUS
  Filled 2019-08-04 (×3): qty 0.4

## 2019-08-04 NOTE — Progress Notes (Signed)
,                                                                                   Patient Demographics:    Kristy Patterson, is a 83 y.o. female, DOB - 02/06/1936, ZOX:[id number]  Admit date - 08/03/2019   Admitting Physician Vassie Lollarlos Madera, MD  Outpatient Primary MD for the patient is Freddy FinnerMills, Hannah M, NP  LOS - 0   Chief Complaint  Patient presents with   Abdominal Pain        Subjective:    Kristy CosierJoan Chavira today has no fevers, No chest pain,  -nausea persist, no emesis Poor oral intake, poor appetite -Abdominal pain persist  Assessment  & Plan :    Principal Problem:   Acute diverticulitis of intestine Active Problems:   Thyroid disease   GERD (gastroesophageal reflux disease)   Chronic renal failure, stage 3b  Brief Summary:- 83 y.o. female with a past medical history significant for diverticulosis, irritable bowel syndrome, hypertension, gastroesophageal reflux disease, thyroid disease and chronic kidney disease a stage IIIb-admitted on 08/03/2019 with acute sigmoid diverticulitis with microperforation   A/p  1) acute sigmoid with colitis with microperforation--- nausea persists, no further emesis however patient was found to have some clear fluids and even then she is reluctant to drink -Abdominal pain remains persistent, tender worse in the right lower quadrant -Continue IV Unasyn, IV morphine sulfate as needed p.o. oxycodone as needed -Continue to encourage clear liquid diet for now  2) hypothyroidism--- with TSH of 15.3, increase levothyroxine 200 mcg from 88 mcg daily, repeat TSH in 4 to 6 weeks with PCP advised  3) possible UTI--admission UA was suspicious for UTI, cultures were not sent at the time, continue Unasyn for now, try to send urine culture if able  4)HTN-stable, continue Cardizem and Cozaar, hold chlorthalidone as below   5)CKD III--creatinine currently 1.26 with a GFR of 40, appears stable -Hold chlorthalidone due to poor oral intake and emesis related to  acute diverticulitis  renally adjust medications, avoid nephrotoxic agents / dehydration /hypotension   Disposition/Need for in-Hospital Stay- patient unable to be discharged at this time due to -acute sigmoid diverticulitis requiring IV antibiotics and poor oral intake requiring IV fluids  Code Status : Full   Family Communication:   NA (patient is alert, awake and coherent)   Disposition Plan  : TBD Consults  :  na  DVT Prophylaxis  :  Lovenox -  - SCDs  Lab Results  Component Value Date   PLT 251 08/04/2019    Inpatient Medications  Scheduled Meds:  diltiazem  120 mg Oral Daily   enoxaparin (LOVENOX) injection  40 mg Subcutaneous Q24H   famotidine  20 mg Oral QHS   [START ON 08/05/2019] levothyroxine  100 mcg Oral Daily   losartan  50 mg Oral BID   polyethylene glycol  17 g Oral Daily   senna-docusate  2 tablet Oral BID   Continuous Infusions:  0.9 % NaCl with KCl 20 mEq / L 75 mL/hr at 08/04/19 0853   ampicillin-sulbactam (UNASYN) IV 3 g (08/04/19 1401)   PRN Meds:.acetaminophen **OR** acetaminophen, labetalol, morphine injection, ondansetron (ZOFRAN) IV,  oxyCODONE    Anti-infectives (From admission, onward)   Start     Dose/Rate Route Frequency Ordered Stop   08/03/19 2200  Ampicillin-Sulbactam (UNASYN) 3 g in sodium chloride 0.9 % 100 mL IVPB     3 g 200 mL/hr over 30 Minutes Intravenous Every 8 hours 08/03/19 1404     08/03/19 1245  ciprofloxacin (CIPRO) IVPB 400 mg     400 mg 200 mL/hr over 60 Minutes Intravenous  Once 08/03/19 1237 08/03/19 1459   08/03/19 1245  metroNIDAZOLE (FLAGYL) IVPB 500 mg     500 mg 100 mL/hr over 60 Minutes Intravenous  Once 08/03/19 1237 08/03/19 1500        Objective:   Vitals:   08/03/19 2009 08/03/19 2112 08/04/19 0525 08/04/19 1401  BP:  140/65 129/61 (!) 153/68  Pulse:  (!) 104 83 88  Resp:  16 16 18   Temp:  99.1 F (37.3 C) 98.9 F (37.2 C) 98.4 F (36.9 C)  TempSrc:  Oral Oral Oral  SpO2: 95% 98% 97%  98%  Weight:      Height:        Wt Readings from Last 3 Encounters:  08/03/19 75.8 kg     Intake/Output Summary (Last 24 hours) at 08/04/2019 1749 Last data filed at 08/04/2019 1500 Gross per 24 hour  Intake 1248.75 ml  Output --  Net 1248.75 ml     Physical Exam  Gen:- Awake Alert,  In no apparent distress  HEENT:- [place].AT, No sclera icterus Neck-Supple Neck,No JVD,.  Lungs-  CTAB , fair symmetrical air movement CV- S1, S2 normal, regular  Abd-  +ve B.Sounds, Abd Soft, lower quadrant tenderness right more than left actually, no significant rebound Extremity/Skin:- No  edema, pedal pulses present  Psych-affect is appropriate, oriented x3 Neuro-no new focal deficits, no tremors   Data Review:   Micro Results No results found for this or any previous visit (from the past 240 hour(s)).  Radiology Reports Ct Abdomen Pelvis Wo Contrast  Result Date: 08/03/2019 CLINICAL DATA:  Lower abdominal pain and nausea for 1 week. Renal insufficiency. Constipation. History of pelvic floor repair and colonic surgery. EXAM: CT ABDOMEN AND PELVIS WITHOUT CONTRAST TECHNIQUE: Multidetector CT imaging of the abdomen and pelvis was performed following the standard protocol without IV contrast. COMPARISON:  Abdominal radiographs from earlier today. FINDINGS: Lower chest: No significant pulmonary nodules or acute consolidative airspace disease. Hepatobiliary: Normal liver size. No liver mass. Normal gallbladder with no radiopaque cholelithiasis. No biliary ductal dilatation. Pancreas: Normal, with no mass or duct dilation. Spleen: Normal size. No mass. Adrenals/Urinary Tract: Normal adrenals. No renal stones. No hydronephrosis. Small parapelvic renal cysts in the left kidney. Simple left renal cysts, largest an exophytic 2.7 cm simple medial upper left renal cyst. No additional contour deforming renal lesions. Normal bladder. Stomach/Bowel: Normal non-distended stomach. Normal caliber small bowel with no  small bowel wall thickening. Normal appendix. Oral contrast transits to the left colon. Moderate diffuse colonic diverticulosis. There is a short segment eccentric wall thickening in mid to distal sigmoid colon with surrounding ill-defined fluid and fat stranding, compatible with acute sigmoid diverticulitis. A few tiny foci of pericolonic free air are noted. No discrete abscess on this noncontrast scan. Vascular/Lymphatic: Atherosclerotic nonaneurysmal abdominal aorta. No pathologically enlarged lymph nodes in the abdomen or pelvis. Reproductive: Status post hysterectomy, with no abnormal findings at the vaginal cuff. No adnexal mass. Other: No ascites.  No focal fluid collection. Musculoskeletal: No aggressive appearing focal osseous lesions.  IMPRESSION: 1. Acute diverticulitis in the mid to distal sigmoid colon. Tiny foci of pericolonic free air indicative of microperforation. Associated pericolonic inflammatory changes without discrete abscess on this scan limited by noncontrast technique. 2.  Aortic Atherosclerosis (ICD10-I70.0). Electronically Signed   By: Ilona Sorrel M.D.   On: 08/03/2019 12:18   Dg Abdomen 1 View  Result Date: 08/03/2019 CLINICAL DATA:  Lower abdominal pain and constipation 2 weeks. IBS. History of colon surgery. EXAM: ABDOMEN - 1 VIEW COMPARISON:  None. FINDINGS: Bowel gas pattern is nonobstructive. No free peritoneal air. Mild degenerate change of the spine and hips. Several pelvic phleboliths are present. IMPRESSION: Nonobstructive bowel gas pattern. Electronically Signed   By: Marin Olp M.D.   On: 08/03/2019 08:58     CBC Recent Labs  Lab 08/03/19 0738 08/04/19 0704  WBC 9.2 7.3  HGB 13.5 11.0*  HCT 44.5 35.1*  PLT 280 251  MCV 92.7 90.5  MCH 28.1 28.4  MCHC 30.3 31.3  RDW 13.7 13.8  LYMPHSABS 1.3  --   MONOABS 0.6  --   EOSABS 0.0  --   BASOSABS 0.0  --     Chemistries  Recent Labs  Lab 08/03/19 0738 08/04/19 0704  NA 135 139  K 3.6 3.6  CL 99 107    CO2 24 23  GLUCOSE 118* 100*  BUN 17 13  CREATININE 1.34* 1.26*  CALCIUM 9.4 8.5*  MG 2.1  --   AST 23  --   ALT 16  --   ALKPHOS 71  --   BILITOT 0.9  --    ------------------------------------------------------------------------------------------------------------------ No results for input(s): CHOL, HDL, LDLCALC, TRIG, CHOLHDL, LDLDIRECT in the last 72 hours.  No results found for: HGBA1C ------------------------------------------------------------------------------------------------------------------ Recent Labs    08/03/19 0738  TSH 15.310*   ------------------------------------------------------------------------------------------------------------------ No results for input(s): VITAMINB12, FOLATE, FERRITIN, TIBC, IRON, RETICCTPCT in the last 72 hours.  Coagulation profile No results for input(s): INR, PROTIME in the last 168 hours.  No results for input(s): DDIMER in the last 72 hours.  Cardiac Enzymes No results for input(s): CKMB, TROPONINI, MYOGLOBIN in the last 168 hours.  Invalid input(s): CK ------------------------------------------------------------------------------------------------------------------ No results found for: BNP   Roxan Hockey M.D on 08/04/2019 at 5:49 PM  Go to [email or web address] - for contact info  [facility] - Office  [phone]

## 2019-08-04 NOTE — Progress Notes (Signed)
Patient has refused to take scheduled diltiazem (Dilacor XR) 24 hour capsule 120 mg at scheduled time of 10.00 this am. Patient stated per her MD she should take this medication at night. Patient wishes to take medication at 2100. Pharmacy and MD has been informed.

## 2019-08-05 MED ORDER — BISACODYL 10 MG RE SUPP
10.0000 mg | Freq: Once | RECTAL | Status: AC
  Administered 2019-08-05: 10 mg via RECTAL
  Filled 2019-08-05: qty 1

## 2019-08-05 MED ORDER — HYDROCODONE-ACETAMINOPHEN 5-325 MG PO TABS
1.0000 | ORAL_TABLET | Freq: Four times a day (QID) | ORAL | Status: DC | PRN
  Administered 2019-08-05 – 2019-08-07 (×5): 1 via ORAL
  Filled 2019-08-05 (×5): qty 1

## 2019-08-05 NOTE — Progress Notes (Signed)
,                                                                                   Patient Demographics:    Kristy Patterson, is a 83 y.o. female, DOB - 1935/10/24, CWC:[id number]  Admit date - 08/03/2019   Admitting Physician Vassie Loll, MD  Outpatient Primary MD for the patient is Freddy Finner, NP  LOS - 1   Chief Complaint  Patient presents with   Abdominal Pain        Subjective:    [name] today has no fevers, No chest pain,   -Nausea persist, appetite is not great, patient reluctant to eat -Abdominal pain is not worse, patient tried to have a bowel movement, but only passed gas  Assessment  & Plan :    Principal Problem:   Acute diverticulitis of intestine Active Problems:   Thyroid disease   GERD (gastroesophageal reflux disease)   Chronic renal failure, stage 3b   Acute diverticulitis  Brief Summary:- 83 y.o. female with a past medical history significant for diverticulosis, irritable bowel syndrome, hypertension, gastroesophageal reflux disease, thyroid disease and chronic kidney disease a stage IIIb-admitted on 08/03/2019 with acute sigmoid diverticulitis with microperforation   A/p 1)Acute sigmoid with colitis with microperforation--- nausea persists, no further emesis  -Patient reluctant to eat due to nausea and abdominal pain,  -Continue liquid diet as tolerated -Continue as needed IV and oral medications for abdominal pain -Continue IV Unasyn,  -Dulcolax suppository x1  2) hypothyroidism--- with TSH of 15.3, increased levothyroxine 200 mcg from 88 mcg daily, repeat TSH in 4 to 6 weeks with PCP advised  3) possible UTI--admission UA was suspicious for UTI, cultures were not sent at the time, continue Unasyn for now, try to send urine culture if able  4)HTN-stable, continue Cardizem and Cozaar, hold chlorthalidone as below  5)CKD III--creatinine currently 1.26 with a GFR of 40, appears stable -Continue to hold chlorthalidone due to poor oral intake  and emesis related to acute diverticulitis  renally adjust medications, avoid nephrotoxic agents / dehydration /hypotension   Disposition/Need for in-Hospital Stay- patient unable to be discharged at this time due to -acute sigmoid diverticulitis requiring pain control as well as requiring IV antibiotics and poor oral intake requiring IV fluids  Code Status : Full   Family Communication:   NA (patient is alert, awake and coherent)  Disposition Plan  : TBD Consults  :  na  DVT Prophylaxis  :  Lovenox -  - SCDs  Lab Results  Component Value Date   PLT 251 08/04/2019    Inpatient Medications  Scheduled Meds:  diltiazem  120 mg Oral Daily   enoxaparin (LOVENOX) injection  40 mg Subcutaneous Q24H   famotidine  20 mg Oral QHS   levothyroxine  100 mcg Oral Daily   losartan  50 mg Oral BID   polyethylene glycol  17 g Oral Daily   senna-docusate  2 tablet Oral BID   Continuous Infusions:  0.9 % NaCl with KCl 20 mEq / L 50 mL/hr at 08/04/19 2217   ampicillin-sulbactam (UNASYN) IV 3 g (08/05/19 1410)   PRN Meds:.acetaminophen **OR** acetaminophen,  HYDROcodone-acetaminophen, labetalol, morphine injection, ondansetron (ZOFRAN) IV    Anti-infectives (From admission, onward)   Start     Dose/Rate Route Frequency Ordered Stop   08/03/19 2200  Ampicillin-Sulbactam (UNASYN) 3 g in sodium chloride 0.9 % 100 mL IVPB     3 g 200 mL/hr over 30 Minutes Intravenous Every 8 hours 08/03/19 1404     08/03/19 1245  ciprofloxacin (CIPRO) IVPB 400 mg     400 mg 200 mL/hr over 60 Minutes Intravenous  Once 08/03/19 1237 08/03/19 1459   08/03/19 1245  metroNIDAZOLE (FLAGYL) IVPB 500 mg     500 mg 100 mL/hr over 60 Minutes Intravenous  Once 08/03/19 1237 08/03/19 1500        Objective:   Vitals:   08/05/19 0514 08/05/19 0514 08/05/19 0840 08/05/19 1321  BP: (!) 118/47 (!) 118/47 (!) 129/59 (!) 116/49  Pulse: 74 74 79 76  Resp: Temp: 98.4 F (36.9 C) 98.4 F (36.9 C)   98.5 F (36.9 C)  TempSrc: Oral Oral  Oral  SpO2: 98% 99%  99%  Weight:      Height:        Wt Readings from Last 3 Encounters:  08/03/19 75.8 kg     Intake/Output Summary (Last 24 hours) at 08/05/2019 1638 Last data filed at 08/05/2019 1616 Gross per 24 hour  Intake 3201.18 ml  Output --  Net 3201.18 ml   Physical Exam  Gen:- Awake Alert,  In no apparent distress  HEENT:- [place].AT, No sclera icterus Neck-Supple Neck,No JVD,.  Lungs-  CTAB , fair symmetrical air movement CV- S1, S2 normal, regular  Abd-  +ve B.Sounds, Abd Soft, lower quadrant tenderness is not worse , no significant rebound Extremity/Skin:- No  edema, pedal pulses present  Psych-affect is appropriate, oriented x3 Neuro-no new focal deficits, no tremors   Data Review:   Micro Results Recent Results (from the past 240 hour(s))  SARS CORONAVIRUS 2 (TAT 6-24 HRS) Nasopharyngeal Nasopharyngeal Swab     Status: None   Collection Time: 08/03/19  7:00 PM   Specimen: Nasopharyngeal Swab  Result Value Ref Range Status   SARS Coronavirus 2 NEGATIVE NEGATIVE Final    Comment: (NOTE) SARS-CoV-2 target nucleic acids are NOT DETECTED. The SARS-CoV-2 RNA is generally detectable in upper and lower respiratory specimens during the acute phase of infection. Negative results do not preclude SARS-CoV-2 infection, do not rule out co-infections with other pathogens, and should not be used as the sole basis for treatment or other patient management decisions. Negative results must be combined with clinical observations, patient history, and epidemiological information. The expected result is Negative. Fact Sheet for Patients: HairSlick.no Fact Sheet for Healthcare Providers: quierodirigir.com This test is not yet approved or cleared by the [place] FDA and  has been authorized for detection and/or diagnosis of SARS-CoV-2 by FDA under an Emergency Use Authorization  (EUA). This EUA will remain  in effect (meaning this test can be used) for the duration of the COVID-19 declaration under Section 56 4(b)(1) of the Act, 21 U.S.C. section 360bbb-3(b)(1), unless the authorization is terminated or revoked sooner. Performed at [facility], [address], [place], [place] [address]     Radiology Reports Ct Abdomen Pelvis Wo Contrast  Result Date: 08/03/2019 CLINICAL DATA:  Lower abdominal pain and nausea for 1 week. Renal insufficiency. Constipation. History of pelvic floor repair and colonic surgery. EXAM: CT ABDOMEN AND PELVIS WITHOUT CONTRAST TECHNIQUE: Multidetector CT imaging  of the abdomen and pelvis was performed following the standard protocol without IV contrast. COMPARISON:  Abdominal radiographs from earlier today. FINDINGS: Lower chest: No significant pulmonary nodules or acute consolidative airspace disease. Hepatobiliary: Normal liver size. No liver mass. Normal gallbladder with no radiopaque cholelithiasis. No biliary ductal dilatation. Pancreas: Normal, with no mass or duct dilation. Spleen: Normal size. No mass. Adrenals/Urinary Tract: Normal adrenals. No renal stones. No hydronephrosis. Small parapelvic renal cysts in the left kidney. Simple left renal cysts, largest an exophytic 2.7 cm simple medial upper left renal cyst. No additional contour deforming renal lesions. Normal bladder. Stomach/Bowel: Normal non-distended stomach. Normal caliber small bowel with no small bowel wall thickening. Normal appendix. Oral contrast transits to the left colon. Moderate diffuse colonic diverticulosis. There is a short segment eccentric wall thickening in mid to distal sigmoid colon with surrounding ill-defined fluid and fat stranding, compatible with acute sigmoid diverticulitis. A few tiny foci of pericolonic free air are noted. No discrete abscess on this noncontrast scan. Vascular/Lymphatic: Atherosclerotic nonaneurysmal abdominal aorta. No pathologically  enlarged lymph nodes in the abdomen or pelvis. Reproductive: Status post hysterectomy, with no abnormal findings at the vaginal cuff. No adnexal mass. Other: No ascites.  No focal fluid collection. Musculoskeletal: No aggressive appearing focal osseous lesions. IMPRESSION: 1. Acute diverticulitis in the mid to distal sigmoid colon. Tiny foci of pericolonic free air indicative of microperforation. Associated pericolonic inflammatory changes without discrete abscess on this scan limited by noncontrast technique. 2.  Aortic Atherosclerosis (ICD10-I70.0). Electronically Signed   By: Delbert PhenixJason A Poff M.D.   On: 08/03/2019 12:18   Dg Abdomen 1 View  Result Date: 08/03/2019 CLINICAL DATA:  Lower abdominal pain and constipation 2 weeks. IBS. History of colon surgery. EXAM: ABDOMEN - 1 VIEW COMPARISON:  None. FINDINGS: Bowel gas pattern is nonobstructive. No free peritoneal air. Mild degenerate change of the spine and hips. Several pelvic phleboliths are present. IMPRESSION: Nonobstructive bowel gas pattern. Electronically Signed   By: Elberta Fortisaniel  Boyle M.D.   On: 08/03/2019 08:58     CBC Recent Labs  Lab 08/03/19 0738 08/04/19 0704  WBC 9.2 7.3  HGB 13.5 11.0*  HCT 44.5 35.1*  PLT 280 251  MCV 92.7 90.5  MCH 28.1 28.4  MCHC 30.3 31.3  RDW 13.7 13.8  LYMPHSABS 1.3  --   MONOABS 0.6  --   EOSABS 0.0  --   BASOSABS 0.0  --     Chemistries  Recent Labs  Lab 08/03/19 0738 08/04/19 0704  NA 135 139  K 3.6 3.6  CL 99 107  CO2 24 23  GLUCOSE 118* 100*  BUN 17 13  CREATININE 1.34* 1.26*  CALCIUM 9.4 8.5*  MG 2.1  --   AST 23  --   ALT 16  --   ALKPHOS 71  --   BILITOT 0.9  --    ------------------------------------------------------------------------------------------------------------------ No results for input(s): CHOL, HDL, LDLCALC, TRIG, CHOLHDL, LDLDIRECT in the last 72 hours.  No results found for:  HGBA1C ------------------------------------------------------------------------------------------------------------------ Recent Labs    08/03/19 0738  TSH 15.310*   ------------------------------------------------------------------------------------------------------------------ No results for input(s): VITAMINB12, FOLATE, FERRITIN, TIBC, IRON, RETICCTPCT in the last 72 hours.  Coagulation profile No results for input(s): INR, PROTIME in the last 168 hours.  No results for input(s): DDIMER in the last 72 hours.  Cardiac Enzymes No results for input(s): CKMB, TROPONINI, MYOGLOBIN in the last 168 hours.  Invalid input(s): CK ------------------------------------------------------------------------------------------------------------------ No results found for: BNP   Diara Chaudhari M.D  on 08/05/2019 at 4:38 PM  Go to [email or web address] - for contact info  [facility] - Office  [phone]

## 2019-08-06 LAB — CBC
HCT: 35.5 % — ABNORMAL LOW (ref 36.0–46.0)
Hemoglobin: 11 g/dL — ABNORMAL LOW (ref 12.0–15.0)
MCH: 28.4 pg (ref 26.0–34.0)
MCHC: 31 g/dL (ref 30.0–36.0)
MCV: 91.7 fL (ref 80.0–100.0)
Platelets: 256 10*3/uL (ref 150–400)
RBC: 3.87 MIL/uL (ref 3.87–5.11)
RDW: 13.8 % (ref 11.5–15.5)
WBC: 5.6 10*3/uL (ref 4.0–10.5)
nRBC: 0 % (ref 0.0–0.2)

## 2019-08-06 LAB — BASIC METABOLIC PANEL
Anion gap: 8 (ref 5–15)
BUN: 11 mg/dL (ref 8–23)
CO2: 26 mmol/L (ref 22–32)
Calcium: 9 mg/dL (ref 8.9–10.3)
Chloride: 107 mmol/L (ref 98–111)
Creatinine, Ser: 1.15 mg/dL — ABNORMAL HIGH (ref 0.44–1.00)
GFR calc Af Amer: 52 mL/min — ABNORMAL LOW (ref >60)
GFR calc non Af Amer: 45 mL/min — ABNORMAL LOW (ref >60)
Glucose, Bld: 101 mg/dL — ABNORMAL HIGH (ref 70–99)
Potassium: 4.2 mmol/L (ref 3.5–5.1)
Sodium: 141 mmol/L (ref 135–145)

## 2019-08-06 MED ORDER — BISACODYL 10 MG RE SUPP
10.0000 mg | Freq: Once | RECTAL | Status: DC
  Filled 2019-08-06: qty 1

## 2019-08-06 MED ORDER — LACTULOSE 10 GM/15ML PO SOLN
30.0000 g | Freq: Once | ORAL | Status: AC
  Administered 2019-08-06: 30 g via ORAL
  Filled 2019-08-06: qty 60

## 2019-08-06 NOTE — Progress Notes (Signed)
,                                                                                   Patient Demographics:    Kristy Patterson, is a 83 y.o. female, DOB - June 21, 1936, ZOX:[id number]  Admit date - 08/03/2019   Admitting Physician Vassie Loll, MD  Outpatient Primary MD for the patient is Freddy Finner, NP  LOS - 2   Chief Complaint  Patient presents with   Abdominal Pain        Subjective:    [name] today has no fevers, No chest pain,   -Complains of increased abdominal discomfort after having about 6 BMs in the last few hours No emesis  Assessment  & Plan :    Principal Problem:   Acute diverticulitis of intestine Active Problems:   Thyroid disease   GERD (gastroesophageal reflux disease)   Chronic renal failure, stage 3b   Acute diverticulitis  Brief Summary:- 83 y.o. female with a past medical history significant for diverticulosis, irritable bowel syndrome, hypertension, gastroesophageal reflux disease, thyroid disease and chronic kidney disease a stage IIIb-admitted on 08/03/2019 with acute sigmoid diverticulitis with microperforation   A/p 1)Acute sigmoid with colitis with microperforation--- nausea persists, no further emesis  -Patient reluctant to eat due to nausea and abdominal pain,  -Increasing abdominal pain due to multiple BMs after laxatives -Continue as needed IV and oral medications for abdominal pain -Continue IV Unasyn,  -Advance diet as tolerated  2) hypothyroidism--- with TSH of 15.3, increased levothyroxine 200 mcg from 88 mcg daily, repeat TSH in 4 to 6 weeks with PCP advised  3) possible UTI--admission UA was suspicious for UTI, cultures were not sent initially, continue Unasyn for now, -Urine culture pending  4)HTN-stable, continue Cardizem and Cozaar, hold chlorthalidone as below  5)CKD III--creatinine improving, currently 1.1 with a GFR of 45, appears stable -Continue to hold chlorthalidone due to poor oral intake and emesis related to acute  diverticulitis  renally adjust medications, avoid nephrotoxic agents / dehydration /hypotension   Disposition/Need for in-Hospital Stay- patient unable to be discharged at this time due to -acute sigmoid diverticulitis requiring pain control as well as requiring IV antibiotics and poor oral intake requiring IV fluids  Code Status : Full   Family Communication:   NA (patient is alert, awake and coherent)  Disposition Plan  : TBD Consults  :  na  DVT Prophylaxis  :  Lovenox -  - SCDs  Lab Results  Component Value Date   PLT 256 08/06/2019    Inpatient Medications  Scheduled Meds:  bisacodyl  10 mg Rectal Once   diltiazem  120 mg Oral Daily   enoxaparin (LOVENOX) injection  40 mg Subcutaneous Q24H   famotidine  20 mg Oral QHS   levothyroxine  100 mcg Oral Daily   losartan  50 mg Oral BID   polyethylene glycol  17 g Oral Daily   senna-docusate  2 tablet Oral BID   Continuous Infusions:  0.9 % NaCl with KCl 20 mEq / L 50 mL/hr at 08/04/19 2217   ampicillin-sulbactam (UNASYN) IV 3 g (08/06/19 1314)   PRN Meds:.acetaminophen **OR** acetaminophen, HYDROcodone-acetaminophen,  labetalol, morphine injection, ondansetron (ZOFRAN) IV    Anti-infectives (From admission, onward)   Start     Dose/Rate Route Frequency Ordered Stop   08/03/19 2200  Ampicillin-Sulbactam (UNASYN) 3 g in sodium chloride 0.9 % 100 mL IVPB     3 g 200 mL/hr over 30 Minutes Intravenous Every 8 hours 08/03/19 1404     08/03/19 1245  ciprofloxacin (CIPRO) IVPB 400 mg     400 mg 200 mL/hr over 60 Minutes Intravenous  Once 08/03/19 1237 08/03/19 1459   08/03/19 1245  metroNIDAZOLE (FLAGYL) IVPB 500 mg     500 mg 100 mL/hr over 60 Minutes Intravenous  Once 08/03/19 1237 08/03/19 1500        Objective:   Vitals:   08/05/19 1321 08/05/19 2056 08/05/19 2059 08/06/19 0529  BP: (!) 116/49  (!) 154/65 (!) 166/69  Pulse: 76 72 75 82  Resp: 18 16  18   Temp: 98.5 F (36.9 C)  (!) 97.5 F (36.4 C)  98.3 F (36.8 C)  TempSrc: Oral  Oral Oral  SpO2: 99%  99% 98%  Weight:      Height:        Wt Readings from Last 3 Encounters:  08/03/19 75.8 kg     Intake/Output Summary (Last 24 hours) at 08/06/2019 1702 Last data filed at 08/06/2019 0300 Gross per 24 hour  Intake 1328.52 ml  Output --  Net 1328.52 ml   Physical Exam  Gen:- Awake Alert,  In no apparent distress  HEENT:- [place].AT, No sclera icterus Neck-Supple Neck,No JVD,.  Lungs-  CTAB , fair symmetrical air movement CV- S1, S2 normal, regular  Abd-  +ve B.Sounds, Abd Soft, lower quadrant tenderness is slightly worse after multiple BMs, no significant rebound Extremity/Skin:- No  edema, pedal pulses present  Psych-affect is appropriate, oriented x3 Neuro-no new focal deficits, no tremors   Data Review:   Micro Results Recent Results (from the past 240 hour(s))  SARS CORONAVIRUS 2 (TAT 6-24 HRS) Nasopharyngeal Nasopharyngeal Swab     Status: None   Collection Time: 08/03/19  7:00 PM   Specimen: Nasopharyngeal Swab  Result Value Ref Range Status   SARS Coronavirus 2 NEGATIVE NEGATIVE Final    Comment: (NOTE) SARS-CoV-2 target nucleic acids are NOT DETECTED. The SARS-CoV-2 RNA is generally detectable in upper and lower respiratory specimens during the acute phase of infection. Negative results do not preclude SARS-CoV-2 infection, do not rule out co-infections with other pathogens, and should not be used as the sole basis for treatment or other patient management decisions. Negative results must be combined with clinical observations, patient history, and epidemiological information. The expected result is Negative. Fact Sheet for Patients: HairSlick.nohttps://www.fda.gov/media/138098/download Fact Sheet for Healthcare Providers: quierodirigir.comhttps://www.fda.gov/media/138095/download This test is not yet approved or cleared by the [place] FDA and  has been authorized for detection and/or diagnosis of SARS-CoV-2 by FDA under an  Emergency Use Authorization (EUA). This EUA will remain  in effect (meaning this test can be used) for the duration of the COVID-19 declaration under Section 56 4(b)(1) of the Act, 21 U.S.C. section 360bbb-3(b)(1), unless the authorization is terminated or revoked sooner. Performed at [facility], [address] [place], [place] [address]     Radiology Reports Ct Abdomen Pelvis Wo Contrast  Result Date: 08/03/2019 CLINICAL DATA:  Lower abdominal pain and nausea for 1 week. Renal insufficiency. Constipation. History of pelvic floor repair and colonic surgery. EXAM: CT ABDOMEN AND PELVIS WITHOUT CONTRAST TECHNIQUE: Multidetector CT  imaging of the abdomen and pelvis was performed following the standard protocol without IV contrast. COMPARISON:  Abdominal radiographs from earlier today. FINDINGS: Lower chest: No significant pulmonary nodules or acute consolidative airspace disease. Hepatobiliary: Normal liver size. No liver mass. Normal gallbladder with no radiopaque cholelithiasis. No biliary ductal dilatation. Pancreas: Normal, with no mass or duct dilation. Spleen: Normal size. No mass. Adrenals/Urinary Tract: Normal adrenals. No renal stones. No hydronephrosis. Small parapelvic renal cysts in the left kidney. Simple left renal cysts, largest an exophytic 2.7 cm simple medial upper left renal cyst. No additional contour deforming renal lesions. Normal bladder. Stomach/Bowel: Normal non-distended stomach. Normal caliber small bowel with no small bowel wall thickening. Normal appendix. Oral contrast transits to the left colon. Moderate diffuse colonic diverticulosis. There is a short segment eccentric wall thickening in mid to distal sigmoid colon with surrounding ill-defined fluid and fat stranding, compatible with acute sigmoid diverticulitis. A few tiny foci of pericolonic free air are noted. No discrete abscess on this noncontrast scan. Vascular/Lymphatic: Atherosclerotic nonaneurysmal  abdominal aorta. No pathologically enlarged lymph nodes in the abdomen or pelvis. Reproductive: Status post hysterectomy, with no abnormal findings at the vaginal cuff. No adnexal mass. Other: No ascites.  No focal fluid collection. Musculoskeletal: No aggressive appearing focal osseous lesions. IMPRESSION: 1. Acute diverticulitis in the mid to distal sigmoid colon. Tiny foci of pericolonic free air indicative of microperforation. Associated pericolonic inflammatory changes without discrete abscess on this scan limited by noncontrast technique. 2.  Aortic Atherosclerosis (ICD10-I70.0). Electronically Signed   By: Ilona Sorrel M.D.   On: 08/03/2019 12:18   Dg Abdomen 1 View  Result Date: 08/03/2019 CLINICAL DATA:  Lower abdominal pain and constipation 2 weeks. IBS. History of colon surgery. EXAM: ABDOMEN - 1 VIEW COMPARISON:  None. FINDINGS: Bowel gas pattern is nonobstructive. No free peritoneal air. Mild degenerate change of the spine and hips. Several pelvic phleboliths are present. IMPRESSION: Nonobstructive bowel gas pattern. Electronically Signed   By: Marin Olp M.D.   On: 08/03/2019 08:58     CBC Recent Labs  Lab 08/03/19 0738 08/04/19 0704 08/06/19 0518  WBC 9.2 7.3 5.6  HGB 13.5 11.0* 11.0*  HCT 44.5 35.1* 35.5*  PLT 280 251 256  MCV 92.7 90.5 91.7  MCH 28.1 28.4 28.4  MCHC 30.3 31.3 31.0  RDW 13.7 13.8 13.8  LYMPHSABS 1.3  --   --   MONOABS 0.6  --   --   EOSABS 0.0  --   --   BASOSABS 0.0  --   --     Chemistries  Recent Labs  Lab 08/03/19 0738 08/04/19 0704 08/06/19 0518  NA 135 139 141  K 3.6 3.6 4.2  CL 99 107 107  CO2 24 23 26   GLUCOSE 118* 100* 101*  BUN 17 13 11   CREATININE 1.34* 1.26* 1.15*  CALCIUM 9.4 8.5* 9.0  MG 2.1  --   --   AST 23  --   --   ALT 16  --   --   ALKPHOS 71  --   --   BILITOT 0.9  --   --    ------------------------------------------------------------------------------------------------------------------ No results for input(s):  CHOL, HDL, LDLCALC, TRIG, CHOLHDL, LDLDIRECT in the last 72 hours.  No results found for: HGBA1C ------------------------------------------------------------------------------------------------------------------ No results for input(s): TSH, T4TOTAL, T3FREE, THYROIDAB in the last 72 hours.  Invalid input(s): FREET3 ------------------------------------------------------------------------------------------------------------------ No results for input(s): VITAMINB12, FOLATE, FERRITIN, TIBC, IRON, RETICCTPCT in the last 72 hours.  Coagulation  profile No results for input(s): INR, PROTIME in the last 168 hours.  No results for input(s): DDIMER in the last 72 hours.  Cardiac Enzymes No results for input(s): CKMB, TROPONINI, MYOGLOBIN in the last 168 hours.  Invalid input(s): CK ------------------------------------------------------------------------------------------------------------------ No results found for: BNP   Shon Hale M.D on 08/06/2019 at 5:02 PM  Go to [email or web address] - for contact info  [facility] - Office  [phone]

## 2019-08-07 ENCOUNTER — Ambulatory Visit: Payer: Self-pay | Admitting: Family Medicine

## 2019-08-07 LAB — URINE CULTURE
Culture: <10000 — AB
Special Requests: NORMAL

## 2019-08-07 MED ORDER — AMOXICILLIN-POT CLAVULANATE 875-125 MG PO TABS: 1.0000 | ORAL_TABLET | Freq: Two times a day (BID) | ORAL | 0 refills | Status: AC

## 2019-08-07 MED ORDER — LOSARTAN POTASSIUM 50 MG PO TABS: 50.0000 mg | ORAL_TABLET | Freq: Two times a day (BID) | ORAL | 5 refills | Status: DC

## 2019-08-07 MED ORDER — HYDROCODONE-ACETAMINOPHEN 5-325 MG PO TABS: 1.0000 | ORAL_TABLET | Freq: Four times a day (QID) | ORAL | 0 refills | Status: DC | PRN

## 2019-08-07 MED ORDER — LEVOTHYROXINE SODIUM 100 MCG PO TABS: 100.0000 ug | ORAL_TABLET | Freq: Every day | ORAL | 1 refills | Status: DC

## 2019-08-07 MED ORDER — LACTULOSE 10 GM/15ML PO SOLN: 10.0000 g | Freq: Every day | ORAL | 0 refills | Status: DC | PRN

## 2019-08-07 MED ORDER — ACETAMINOPHEN 325 MG PO TABS: 650.0000 mg | ORAL_TABLET | Freq: Four times a day (QID) | ORAL | 0 refills | Status: DC | PRN

## 2019-08-07 MED ORDER — DILTIAZEM HCL ER 120 MG PO CP24: 120.0000 mg | ORAL_CAPSULE | Freq: Every day | ORAL | 3 refills | Status: DC

## 2019-08-07 NOTE — Discharge Summary (Signed)
Kristy Patterson, is a 83 y.o. female  DOB 17-May-1936  MRN [id number].  Admission date:  08/03/2019  Admitting Physician  Barton Dubois, MD  Discharge Date:  08/07/2019   Primary MD  Perlie Mayo, NP  Recommendations for primary care physician for things to follow:   1)Please  recheck your thyroid test in 4 to 6 weeks with a primary care physician 2)Please  take medications as prescribed--- chlorthalidone has been discontinued due to concerns about dehydration and electrolyte abnormalities 3)Please  call if persistent diarrhea, fevers or persistent abdominal pain  Admission Diagnosis  Acute diverticulitis [K57.92]   Discharge Diagnosis  Acute diverticulitis [K57.92]   Principal Problem:   Acute diverticulitis of intestine Active Problems:   Thyroid disease   GERD (gastroesophageal reflux disease)   Chronic renal failure, stage 3b   Acute diverticulitis      Past Medical History:  Diagnosis Date   Anemia    Diverticulitis    GERD (gastroesophageal reflux disease)    Hypertension    Irritable bowel syndrome (IBS)    Renal disorder    Sleep apnea    Stage 3 chronic kidney disease    Thyroid disease     Past Surgical History:  Procedure Laterality Date   COLON SURGERY     HEMORRHOID SURGERY     PELVIC FLOOR REPAIR         HPI  from the history and physical done on the day of admission:    Chief Complaint: Abdominal pain and nausea  HPI: Kristy Patterson is a 83 y.o. female with a past medical history significant for diverticulosis, irritable bowel syndrome, hypertension, gastroesophageal reflux disease, thyroid disease and chronic kidney disease a stage IIIb; who presented to the hospital secondary to left lower quadrant abdominal pain and nausea.  Patient reports symptom has been present for the last 2-3 days and worsening.  She expressed pain is stabbing, crampy, 7 out of 10 in  intensity and radiated across her lower abdomen.  Patient reported associated nausea and anorexia but no vomiting.  There has not been any fever, chills, chest pain, shortness of breath, melena, hematochezia, dysuria, hematuria, headaches, focal weakness, decreased days or sense of smell, no contact with any known positive Covid patient.  In the ED work-up demonstrated CT abdomen with sigmoid diverticulitis and contained microperforation; normal WBCs and a stable basic metabolic panel with a creatinine of 1.34.  IV antibiotics, pain medications and antiemetics were provided in the ED along with fluid resuscitation.  TR[facility] has been consulted to further manage acute diverticulitis with concerns of inability to tolerate oral medications with active nausea and also severe pain requiring IV pain management.    Hospital Course:   Brief Summary:- 83 y.o.femalewith a past medical history significant for diverticulosis, irritable bowel syndrome, hypertension, gastroesophageal reflux disease, thyroid disease and chronic kidney disease a stage IIIb-admitted on 08/03/2019 with acute sigmoid diverticulitis with microperforation   A/p 1)Acute sigmoid with colitis with microperforation--- -overall much improved,  -Tolerating oral intake well -Had  multiple BMs, no further emesis, significant improvement in  abdominal pain.... -Treated with IV Unasyn, okay to discharge home on p.o. Augmentin  2)Hypothyroidism--- with TSH of 15.3, increased levothyroxine 100 mcg from 88 mcg daily, repeat TSH in 4 to 6 weeks with PCP advised  3)Possible UTI--admission UA was suspicious for UTI, cultures were not sent initially, antibiotics as above #1 -Urine culture NGTD  4)HTN-stable, continue Cardizem and Cozaar,  -Discontinue chlorthalidone due to concerns about electrolyte normalities and dehydration risk   5)CKD III--creatinine improved, currently 1.1 with a GFR of 45, appears stable -Chlorthalidone discontinued   renally adjust medications, avoid nephrotoxic agents / dehydration /hypotension   Disposition-home with oral antibioticsCode Status : Full   Family Communication:   NA (patient is alert, awake and coherent)  Discharge Condition: stable  Follow UP--- PCP as advised    Consults obtained - Na  Diet and Activity recommendation:  As advised  Discharge Instructions    Discharge Instructions    Call MD for:  difficulty breathing, headache or visual disturbances   Complete by: As directed    Call MD for:  persistant dizziness or light-headedness   Complete by: As directed    Call MD for:  persistant nausea and vomiting   Complete by: As directed    Call MD for:  temperature >100.4   Complete by: As directed    Diet - low sodium heart healthy   Complete by: As directed    Discharge instructions   Complete by: As directed    1)Please  recheck your thyroid test in 4 to 6 weeks with a primary care physician 2)Please  take medications as prescribed--- chlorthalidone has been discontinued due to concerns about dehydration and electrolyte abnormalities 3)Please  call if persistent diarrhea, fevers or persistent abdominal pain   Increase activity slowly   Complete by: As directed        Discharge Medications     Allergies as of 08/07/2019      Reactions   Atropine Swelling   Dried me out    Amlodipine Palpitations   Compazine [prochlorperazine Edisylate] Anxiety   Gets the shakes       Medication List    STOP taking these medications   chlorthalidone 25 MG tablet Commonly known as: HYGROTON   Levothyroxine Sodium 88 MCG Caps Replaced by: levothyroxine 100 MCG tablet     TAKE these medications   acetaminophen 325 MG tablet Commonly known as: TYLENOL Take 2 tablets (650 mg total) by mouth every 6 (six) hours as needed for mild pain, moderate pain, fever or headache (or Fever >/= 101).   amoxicillin-clavulanate 875-125 MG tablet Commonly known as: Augmentin Take 1  tablet by mouth 2 (two) times daily for 7 days.   diltiazem 120 MG 24 hr capsule Commonly known as: DILACOR XR Take 1 capsule (120 mg total) by mouth daily.   HYDROcodone-acetaminophen 5-325 MG tablet Commonly known as: NORCO/VICODIN Take 1 tablet by mouth every 6 (six) hours as needed for moderate pain or severe pain.   lactulose 10 GM/15ML solution Commonly known as: CHRONULAC Take 15 mLs (10 g total) by mouth daily as needed for mild constipation or moderate constipation.   levothyroxine 100 MCG tablet Commonly known as: SYNTHROID Take 1 tablet (100 mcg total) by mouth daily before breakfast. Replaces: Levothyroxine Sodium 88 MCG Caps   losartan 50 MG tablet Commonly known as: COZAAR Take 1 tablet (50 mg total) by mouth 2 (two) times daily. For BP What changed:  additional instructions       Major procedures and Radiology Reports - PLEASE review detailed and final reports for all details, in brief -     Ct Abdomen Pelvis Wo Contrast  Result Date: 08/03/2019 CLINICAL DATA:  Lower abdominal pain and nausea for 1 week. Renal insufficiency. Constipation. History of pelvic floor repair and colonic surgery. EXAM: CT ABDOMEN AND PELVIS WITHOUT CONTRAST TECHNIQUE: Multidetector CT imaging of the abdomen and pelvis was performed following the standard protocol without IV contrast. COMPARISON:  Abdominal radiographs from earlier today. FINDINGS: Lower chest: No significant pulmonary nodules or acute consolidative airspace disease. Hepatobiliary: Normal liver size. No liver mass. Normal gallbladder with no radiopaque cholelithiasis. No biliary ductal dilatation. Pancreas: Normal, with no mass or duct dilation. Spleen: Normal size. No mass. Adrenals/Urinary Tract: Normal adrenals. No renal stones. No hydronephrosis. Small parapelvic renal cysts in the left kidney. Simple left renal cysts, largest an exophytic 2.7 cm simple medial upper left renal cyst. No additional contour deforming renal  lesions. Normal bladder. Stomach/Bowel: Normal non-distended stomach. Normal caliber small bowel with no small bowel wall thickening. Normal appendix. Oral contrast transits to the left colon. Moderate diffuse colonic diverticulosis. There is a short segment eccentric wall thickening in mid to distal sigmoid colon with surrounding ill-defined fluid and fat stranding, compatible with acute sigmoid diverticulitis. A few tiny foci of pericolonic free air are noted. No discrete abscess on this noncontrast scan. Vascular/Lymphatic: Atherosclerotic nonaneurysmal abdominal aorta. No pathologically enlarged lymph nodes in the abdomen or pelvis. Reproductive: Status post hysterectomy, with no abnormal findings at the vaginal cuff. No adnexal mass. Other: No ascites.  No focal fluid collection. Musculoskeletal: No aggressive appearing focal osseous lesions. IMPRESSION: 1. Acute diverticulitis in the mid to distal sigmoid colon. Tiny foci of pericolonic free air indicative of microperforation. Associated pericolonic inflammatory changes without discrete abscess on this scan limited by noncontrast technique. 2.  Aortic Atherosclerosis (ICD10-I70.0). Electronically Signed   By: Delbert Phenix M.D.   On: 08/03/2019 12:18   Dg Abdomen 1 View  Result Date: 08/03/2019 CLINICAL DATA:  Lower abdominal pain and constipation 2 weeks. IBS. History of colon surgery. EXAM: ABDOMEN - 1 VIEW COMPARISON:  None. FINDINGS: Bowel gas pattern is nonobstructive. No free peritoneal air. Mild degenerate change of the spine and hips. Several pelvic phleboliths are present. IMPRESSION: Nonobstructive bowel gas pattern. Electronically Signed   By: Elberta Fortis M.D.   On: 08/03/2019 08:58    Micro Results   Recent Results (from the past 240 hour(s))  SARS CORONAVIRUS 2 (TAT 6-24 HRS) Nasopharyngeal Nasopharyngeal Swab     Status: None   Collection Time: 08/03/19  7:00 PM   Specimen: Nasopharyngeal Swab  Result Value Ref Range Status   SARS  Coronavirus 2 NEGATIVE NEGATIVE Final    Comment: (NOTE) SARS-CoV-2 target nucleic acids are NOT DETECTED. The SARS-CoV-2 RNA is generally detectable in upper and lower respiratory specimens during the acute phase of infection. Negative results do not preclude SARS-CoV-2 infection, do not rule out co-infections with other pathogens, and should not be used as the sole basis for treatment or other patient management decisions. Negative results must be combined with clinical observations, patient history, and epidemiological information. The expected result is Negative. Fact Sheet for Patients: HairSlick.no Fact Sheet for Healthcare Providers: quierodirigir.com This test is not yet approved or cleared by the [place] FDA and  has been authorized for detection and/or diagnosis of SARS-CoV-2 by FDA under an Emergency Use Authorization (EUA). This  EUA will remain  in effect (meaning this test can be used) for the duration of the COVID-19 declaration under Section 56 4(b)(1) of the Act, 21 U.S.C. section 360bbb-3(b)(1), unless the authorization is terminated or revoked sooner. Performed at [facility], [address] [place], [place] [address]   Culture, Urine     Status: Abnormal   Collection Time: 08/05/19  2:20 PM   Specimen: Urine, Clean Catch  Result Value Ref Range Status   Specimen Description   Final    URINE, CLEAN CATCH Performed at [facility], [address], [place], [place] [address]    Special Requests   Final    Normal Performed at [facility], [address], [place], [place] [address]    Culture (A)  Final    <10,000 COLONIES/mL INSIGNIFICANT GROWTH Performed at [facility], [address], [place], [place] [address]    Report Status 08/07/2019 FINAL  Final       Today   Subjective    Odilia Damico today has no new complaints No fever  Or chills   No Nausea, Vomiting   -Abdominal  pain is significantly improved -Tolerating oral intake, had BMs          Patient has been seen and examined prior to discharge   Objective   Blood pressure 138/63, pulse 80, temperature 98.2 F (36.8 C), temperature source Oral, resp. rate 16, height 5\' 3"  (1.6 m), weight 75.8 kg, SpO2 98 %.   Intake/Output Summary (Last 24 hours) at 08/07/2019 1022 Last data filed at 08/07/2019 0600 Gross per 24 hour  Intake 1571.12 ml  Output --  Net 1571.12 ml    Exam Gen:- Awake Alert, no acute distress  HEENT:- [place].AT, No sclera icterus Neck-Supple Neck,No JVD,.  Lungs-  CTAB , good air movement bilaterally  CV- S1, S2 normal, regular Abd-  +ve B.Sounds, Abd Soft, ND, overall much improved lower abdominal tenderness, no rebound or guarding, Extremity/Skin:- No  edema,   good pulses Psych-affect is appropriate, oriented x3 Neuro-no new focal deficits, no tremors    Data Review   CBC w Diff:  Lab Results  Component Value Date   WBC 5.6 08/06/2019   HGB 11.0 (L) 08/06/2019   HCT 35.5 (L) 08/06/2019   PLT 256 08/06/2019   LYMPHOPCT 15 08/03/2019   MONOPCT 7 08/03/2019   EOSPCT 0 08/03/2019   BASOPCT 0 08/03/2019    CMP:  Lab Results  Component Value Date   NA 141 08/06/2019   K 4.2 08/06/2019   CL 107 08/06/2019   CO2 26 08/06/2019   BUN 11 08/06/2019   CREATININE 1.15 (H) 08/06/2019   PROT 8.0 08/03/2019   ALBUMIN 4.1 08/03/2019   BILITOT 0.9 08/03/2019   ALKPHOS 71 08/03/2019   AST 23 08/03/2019   ALT 16 08/03/2019  .   Total Discharge time is about 33 minutes  14/11/2018 M.D on 08/07/2019 at 10:22 AM  Go to [email or web address] -  for contact info  [facility] - Office  [phone]

## 2019-08-07 NOTE — Discharge Instructions (Signed)
1)Please  recheck your thyroid test in 4 to 6 weeks with a primary care physician 2)Please  take medications as prescribed--- chlorthalidone has been discontinued due to concerns about dehydration and electrolyte abnormalities 3)Please  call if persistent diarrhea, fevers or persistent abdominal pain

## 2019-08-08 ENCOUNTER — Ambulatory Visit: Payer: Self-pay | Admitting: Family Medicine

## 2019-08-21 ENCOUNTER — Encounter: Payer: Self-pay | Admitting: Family Medicine

## 2019-08-21 ENCOUNTER — Other Ambulatory Visit: Payer: Self-pay

## 2019-08-21 ENCOUNTER — Ambulatory Visit: Payer: Federal, State, Local not specified - PPO | Admitting: Family Medicine

## 2019-08-21 VITALS — BP 180/78 | HR 94 | Temp 98.9°F | Resp 15 | Ht 63.5 in | Wt 158.1 lb

## 2019-08-21 DIAGNOSIS — E559 Vitamin D deficiency, unspecified: Secondary | ICD-10-CM | POA: Diagnosis not present

## 2019-08-21 DIAGNOSIS — E039 Hypothyroidism, unspecified: Secondary | ICD-10-CM | POA: Diagnosis not present

## 2019-08-21 DIAGNOSIS — K581 Irritable bowel syndrome with constipation: Secondary | ICD-10-CM

## 2019-08-21 DIAGNOSIS — E538 Deficiency of other specified B group vitamins: Secondary | ICD-10-CM

## 2019-08-21 DIAGNOSIS — I10 Essential (primary) hypertension: Secondary | ICD-10-CM

## 2019-08-21 DIAGNOSIS — R7989 Other specified abnormal findings of blood chemistry: Secondary | ICD-10-CM

## 2019-08-21 DIAGNOSIS — K5900 Constipation, unspecified: Secondary | ICD-10-CM | POA: Insufficient documentation

## 2019-08-21 DIAGNOSIS — K5792 Diverticulitis of intestine, part unspecified, without perforation or abscess without bleeding: Secondary | ICD-10-CM

## 2019-08-21 DIAGNOSIS — N1831 Chronic kidney disease, stage 3a: Secondary | ICD-10-CM

## 2019-08-21 MED ORDER — CYANOCOBALAMIN 1000 MCG/ML IJ SOLN
1000.0000 ug | Freq: Once | INTRAMUSCULAR | Status: AC
  Administered 2019-08-21: 1000 ug via INTRAMUSCULAR

## 2019-08-21 NOTE — Progress Notes (Signed)
Subjective:  Patient ID: Kristy Patterson, female    DOB: 1936-02-04  Age: 83 y.o. MRN: [id number]  CC:  Chief Complaint  Patient presents with  . New Patient (Initial Visit)    establish care    HPI  HPI   Kristy Patterson is an 83 year old female patient who is new to the practice.  Moved down from [place] back in November 2020.  Reports today to establish care.  Has had a recent stay in the hospital secondary to diverticulitis.  Reports that she did not take her antibiotic as ordered because it made her feel dehydrated and sick.  Today she presents with several concerns to discuss including the diverticulitis, hypothyroidism, B12 injections needing to be started, hemorrhoids, IBS, colorectal surgery back in 2010, pelvic reconstruction, significant constipation related to her IBS, right foot only swelling, needing refills on medications, needing updated labs, anxiety about not feeling good.  History includes but is not limited to anemia, diverticulitis, GERD, hypertension, irritable bowel, renal disorder, stage III kidney disease, thyroid disease among others.  Reports that she is not a smoker nor alcohol user.  Denies any illicit drug use.  Is not currently sexually active.  Needs to sign release form for records to be sent to the office.  Reports that today she did not take her blood pressure medicines last night and that is why her blood pressure is high as well as the fact that she feels very anxious about the situation that she is going on with her bowels as she has not had a bowel movement since yesterday which she reports was very small.  And prior to that she reports it was 2 to 3 days before that and it was still very small.  She reports that she has had to use lactulose, MiraLAX, suppositories to try to help get her bowels to move.  She had difficulty moving them when she was hospitalized as well.  Is willing to see a GI specialist for this.   Today patient denies signs and symptoms of  COVID 19 infection including fever, chills, cough, shortness of breath, and headache. Past Medical, Surgical, Social History, Allergies, and Medications have been Reviewed.   Past Medical History:  Diagnosis Date  . Acquired hypothyroidism 10/06/2015  . Anemia   . Diverticulitis   . GERD (gastroesophageal reflux disease)   . Hypertension   . Hypertensive urgency 02/22/2016  . Irritable bowel syndrome (IBS)   . Precordial chest pain 02/23/2016  . Recurrent UTI 09/18/2015  . Renal disorder   . Sleep apnea   . Stage 3 chronic kidney disease   . Thyroid disease     Current Meds  Medication Sig  . acetaminophen (TYLENOL) 325 MG tablet Take 2 tablets (650 mg total) by mouth every 6 (six) hours as needed for mild pain, moderate pain, fever or headache (or Fever >/= 101).  Marland Kitchen diltiazem (DILACOR XR) 120 MG 24 hr capsule Take 1 capsule (120 mg total) by mouth daily.  Marland Kitchen levothyroxine (SYNTHROID) 100 MCG tablet Take 1 tablet (100 mcg total) by mouth daily before breakfast.  . losartan (COZAAR) 50 MG tablet Take 1 tablet (50 mg total) by mouth 2 (two) times daily. For BP    ROS:  Review of Systems  Constitutional: Negative.   HENT: Negative.   Eyes: Negative.   Respiratory: Negative.   Cardiovascular: Negative.   Gastrointestinal: Positive for abdominal pain and constipation.  Genitourinary: Negative.   Musculoskeletal: Negative.   Skin: Negative.  Neurological: Negative.   Endo/Heme/Allergies: Negative.   Psychiatric/Behavioral: The patient is nervous/anxious.      Objective:   Today's Vitals: BP (!) 180/78   Pulse 94   Temp 98.9 F (37.2 C) (Oral)   Resp 15   Ht 5' 3.5" (1.613 m)   Wt 158 lb 1.3 oz (71.7 kg)   SpO2 97%   BMI 27.56 kg/m  Vitals with BMI 08/21/2019 08/07/2019 08/06/2019  Height 5' 3.5" - -  Weight 158 lbs 1 oz - -  BMI 27.56 - -  Systolic 180 138 361  Diastolic 78 63 70  Pulse 94 80 85     Physical Exam Vitals and nursing note reviewed.    Constitutional:      Appearance: Normal appearance. She is well-developed, well-groomed and overweight.  HENT:     Head: Normocephalic and atraumatic.     Right Ear: External ear normal.     Left Ear: External ear normal.     Nose: Nose normal.     Mouth/Throat:     Mouth: Mucous membranes are moist.     Pharynx: Oropharynx is clear.  Eyes:     General:        Right eye: No discharge.        Left eye: No discharge.     Conjunctiva/sclera: Conjunctivae normal.  Cardiovascular:     Rate and Rhythm: Normal rate and regular rhythm.     Pulses: Normal pulses.     Heart sounds: Normal heart sounds.  Pulmonary:     Effort: Pulmonary effort is normal.     Breath sounds: Normal breath sounds.  Musculoskeletal:        General: Normal range of motion.     Cervical back: Normal range of motion and neck supple.  Skin:    General: Skin is warm.  Neurological:     General: No focal deficit present.     Mental Status: She is alert and oriented to person, place, and time.  Psychiatric:        Attention and Perception: Attention and perception normal.        Mood and Affect: Affect normal. Mood is anxious.        Speech: Speech normal.        Behavior: Behavior normal. Behavior is cooperative.        Thought Content: Thought content normal.        Cognition and Memory: Cognition and memory normal.        Judgment: Judgment normal.      Assessment   1. Hypothyroidism, unspecified type   2. Elevated TSH   3. Irritable bowel syndrome with constipation   4. Vitamin D deficiency   5. Essential hypertension   6. Diverticulitis   7. B12 deficiency   8. Acute diverticulitis of intestine   9. Stage 3a chronic kidney disease     Tests ordered Orders Placed This Encounter  Procedures  . CBC with Differential/Platelet  . COMPLETE METABOLIC PANEL WITH GFR  . VITAMIN D 25 Hydroxy  . Thyroid Panel With TSH  . Ambulatory referral to Gastroenterology    Plan: Please see assessment  and plan per problem list above.   Meds ordered this encounter  Medications  . cyanocobalamin ((VITAMIN B-12)) injection 1,000 mcg    Patient to follow-up in 09/04/2019  Freddy Finner, NP

## 2019-08-21 NOTE — Patient Instructions (Addendum)
  I appreciate the opportunity to provide you with care for your health and wellness. Today we discussed: establish care  Follow up: 2 weeks    Labs today at [facility]   1) Start wearing compression socks 5-15 mmhg  2) Referral to GI made today 3) Take BP when you get home 4) B-12 injection today 5) Start taking miralax daily 6) Drink at least 40 oz of water daily 7) Walk daily 15-30 minutes  I hope you have a wonderful, happy, safe, and healthy Holiday Season! See you in the New Year :)  Please continue to practice social distancing to keep you, your family, and our community safe.  If you must go out, please wear a mask and practice good handwashing.  It was a pleasure to see you and I look forward to continuing to work together on your health and well-being. Please do not hesitate to call the office if you need care or have questions about your care.  Have a wonderful day and week. With Gratitude, Cherly Beach, DNP, AGNP-BC

## 2019-08-22 LAB — CBC WITH DIFFERENTIAL/PLATELET
Absolute Monocytes: 479 cells/uL (ref 200–950)
Basophils Absolute: 19 cells/uL (ref 0–200)
Basophils Relative: 0.3 %
Eosinophils Absolute: 50 cells/uL (ref 15–500)
Eosinophils Relative: 0.8 %
HCT: 40.1 % (ref 35.0–45.0)
Hemoglobin: 13.2 g/dL (ref 11.7–15.5)
Lymphs Abs: 1657 cells/uL (ref 850–3900)
MCH: 28.7 pg (ref 27.0–33.0)
MCHC: 32.9 g/dL (ref 32.0–36.0)
MCV: 87.2 fL (ref 80.0–100.0)
MPV: 12 fL (ref 7.5–12.5)
Monocytes Relative: 7.6 %
Neutro Abs: 4095 cells/uL (ref 1500–7800)
Neutrophils Relative %: 65 %
Platelets: 131 10*3/uL — ABNORMAL LOW (ref 140–400)
RBC: 4.6 10*6/uL (ref 3.80–5.10)
RDW: 13.7 % (ref 11.0–15.0)
Total Lymphocyte: 26.3 %
WBC: 6.3 10*3/uL (ref 3.8–10.8)

## 2019-08-22 LAB — COMPLETE METABOLIC PANEL WITH GFR
AG Ratio: 1.4 (calc) (ref 1.0–2.5)
ALT: 8 U/L (ref 6–29)
AST: 12 U/L (ref 10–35)
Albumin: 4 g/dL (ref 3.6–5.1)
Alkaline phosphatase (APISO): 79 U/L (ref 37–153)
BUN/Creatinine Ratio: 13 (calc) (ref 6–22)
BUN: 15 mg/dL (ref 7–25)
CO2: 28 mmol/L (ref 20–32)
Calcium: 10.1 mg/dL (ref 8.6–10.4)
Chloride: 103 mmol/L (ref 98–110)
Creat: 1.18 mg/dL — ABNORMAL HIGH (ref 0.60–0.88)
GFR, Est African American: 50 mL/min/{1.73_m2} — ABNORMAL LOW (ref >=60)
GFR, Est Non African American: 43 mL/min/{1.73_m2} — ABNORMAL LOW (ref >=60)
Globulin: 2.9 g/dL (calc) (ref 1.9–3.7)
Glucose, Bld: 102 mg/dL (ref 65–139)
Potassium: 4.5 mmol/L (ref 3.5–5.3)
Sodium: 141 mmol/L (ref 135–146)
Total Bilirubin: 0.4 mg/dL (ref 0.2–1.2)
Total Protein: 6.9 g/dL (ref 6.1–8.1)

## 2019-08-22 LAB — THYROID PANEL WITH TSH
Free Thyroxine Index: 2.7 (ref 1.4–3.8)
T3 Uptake: 32 % (ref 22–35)
T4, Total: 8.5 ug/dL (ref 5.1–11.9)
TSH: 4.11 mIU/L (ref 0.40–4.50)

## 2019-08-22 LAB — VITAMIN D 25 HYDROXY (VIT D DEFICIENCY, FRACTURES): Vit D, 25-Hydroxy: 24 ng/mL — ABNORMAL LOW (ref 30–100)

## 2019-08-25 ENCOUNTER — Encounter: Payer: Self-pay | Admitting: Family Medicine

## 2019-08-25 DIAGNOSIS — K5730 Diverticulosis of large intestine without perforation or abscess without bleeding: Secondary | ICD-10-CM | POA: Insufficient documentation

## 2019-08-25 DIAGNOSIS — E039 Hypothyroidism, unspecified: Secondary | ICD-10-CM | POA: Insufficient documentation

## 2019-08-25 DIAGNOSIS — K5792 Diverticulitis of intestine, part unspecified, without perforation or abscess without bleeding: Secondary | ICD-10-CM | POA: Insufficient documentation

## 2019-08-25 DIAGNOSIS — E559 Vitamin D deficiency, unspecified: Secondary | ICD-10-CM | POA: Insufficient documentation

## 2019-08-25 DIAGNOSIS — R7989 Other specified abnormal findings of blood chemistry: Secondary | ICD-10-CM | POA: Insufficient documentation

## 2019-08-25 DIAGNOSIS — K581 Irritable bowel syndrome with constipation: Secondary | ICD-10-CM | POA: Insufficient documentation

## 2019-08-25 NOTE — Assessment & Plan Note (Signed)
Referral for GI.

## 2019-08-25 NOTE — Assessment & Plan Note (Addendum)
Did not complete anbx due to how they made her feel.  Referral to GI placed for close follow up.  Getting labs to check WBC count

## 2019-08-25 NOTE — Assessment & Plan Note (Signed)
Elevated TSH in hospital.  Medication was increased. She is tolerating it well.  Will recheck levels with labs.

## 2019-08-25 NOTE — Assessment & Plan Note (Signed)
Checking labs

## 2019-08-25 NOTE — Assessment & Plan Note (Signed)
Injection provided today. Advised to continue every month.  Consent obtained prior to injection.

## 2019-08-25 NOTE — Assessment & Plan Note (Signed)
Kristy Patterson is encouraged to maintain a well balanced diet that is low in salt. Not controlled, continue current medication regimen.  Advised to take her education when she got home and before each appt. Additionally, she is also reminded that exercise is beneficial for heart health and control of Blood pressure. 30-60 minutes daily is recommended-walking was suggested.

## 2019-08-25 NOTE — Assessment & Plan Note (Signed)
Referral for GI Appreciate collaboration in her care. Please let PCP know if I can do anything.

## 2019-08-25 NOTE — Assessment & Plan Note (Signed)
Check labs and continue current dosage unless the labs show otherwise.

## 2019-08-25 NOTE — Assessment & Plan Note (Signed)
Follow up with labs-encouraged hydration and to make sure she takes blood pressure medication to protect kidneys

## 2019-08-27 ENCOUNTER — Encounter: Payer: Self-pay | Admitting: Family Medicine

## 2019-08-27 ENCOUNTER — Telehealth: Payer: Self-pay

## 2019-08-27 NOTE — Telephone Encounter (Signed)
Spoke with patient. Dr.Rehman is unable to see the patient until the end of January. Will refer to Dr.Rourk. Patient in agreement.

## 2019-08-27 NOTE — Telephone Encounter (Signed)
Please call Urgently [phone]

## 2019-08-28 ENCOUNTER — Other Ambulatory Visit: Payer: Self-pay

## 2019-08-28 ENCOUNTER — Ambulatory Visit: Payer: Federal, State, Local not specified - PPO | Admitting: Gastroenterology

## 2019-08-28 ENCOUNTER — Encounter: Payer: Self-pay | Admitting: Gastroenterology

## 2019-08-28 DIAGNOSIS — Z8719 Personal history of other diseases of the digestive system: Secondary | ICD-10-CM | POA: Diagnosis not present

## 2019-08-28 MED ORDER — SULFAMETHOXAZOLE-TRIMETHOPRIM 800-160 MG PO TABS: 1.0000 | ORAL_TABLET | Freq: Two times a day (BID) | ORAL | 0 refills | Status: AC

## 2019-08-28 NOTE — Patient Instructions (Signed)
Let's start Linzess one capsule 30 minutes before breakfast on an empty stomach. I have given samples. Let's try this a few days to see if your discomfort improves. If not, we will have you start an antibiotic.  I am calling the pharmacist to help choose the best option in your case. The Bactrim would need to be taken with Flagyl, but there is another single agent antibiotic we could prescribe; however, I need to make sure your insurance will cover this. I will be working on that today so it is on file for you if needed.  Please call if worsening symptoms, and we will need to do a CT scan.  It was a pleasure to see you today. I want to create trusting relationships with patients to provide genuine, compassionate, and quality care. I value your feedback. If you receive a survey regarding your visit,  I greatly appreciate you taking time to fill this out.   Annitta Needs, PhD, ANP-BC [facility]

## 2019-08-28 NOTE — Progress Notes (Signed)
Primary Care Physician:  Kerri PerchesSimpson, Margaret E, MD Primary Gastroenterologist:  Dr. Jena Gaussourk   Chief Complaint  Patient presents with  . Abdominal Pain    lower abd  . Constipation    Miralax not helping    HPI:   Kristy Patterson is an 83 y.o. female presenting today at the request of Dr. Lodema HongSimpson due to diverticulitis. Previously has been seen by [facility] and also in [place]. She has a longstanding history of IBS,  GERD, and recently inpatient Dec 2020 with acute diverticulitis in mid to distal sigmoid colon, with tiny foci of pericolonic free air indicative of microperfration. No abscess obvious, and CT scan limited due to non-contrast. Care Everywhere records reviewed. Moved from [place] to [place] to be closer to daughter, who lives in [place].   Notes continued intermittent pain in lower abdomen, LLQ/RLQ. When patient went to PCP's office, was having right foot swelling. Takes Miralax for constipation. Yesterday 1.5 doses Miralax and can't have BM. Uses suppositories. Chronic nausea. Day before discharge had a BM and had been constipated during hospitalization. Took lactulose while in hospital. Abdominal pain improved after BMs. Linzess caused significant diarrhea without warning. Wasn't able to finish off course of Augmentin. Cipro/Flagyl was given X 1 with nausea while inpatient. Was unable to complete course of Augmentin due to nausea, just finished one dose with nausea when discharged.   Chronic symptoms of feeling "blocked" every 10 days, with significant constipation. GI in [place]. Used to be given Cipro for bacterial overgrowth by GI. States she had a breath test a long time ago. Would take cipro intermittently.   First colonoscopy at age 83. Possible history of polyps but doesn't believe was pre-cancerous. No family history or colorectal cancer or polyps. Father at age 81 with perforated colon and needing colostomy.    If leaning over, helps to have a BM. She is  hesitant to pursue colonoscopy.     Past Medical History:  Diagnosis Date  . Acquired hypothyroidism 10/06/2015  . Anemia   . Diverticulitis   . GERD (gastroesophageal reflux disease)   . Hypertension   . Hypertensive urgency 02/22/2016  . Irritable bowel syndrome (IBS)   . Precordial chest pain 02/23/2016  . Recurrent UTI 09/18/2015  . Renal disorder   . Sleep apnea   . Stage 3 chronic kidney disease   . Thyroid disease     Past Surgical History:  Procedure Laterality Date  . COLON SURGERY    . HEMORRHOID SURGERY    . PELVIC FLOOR REPAIR     anterior colporrhaphy, posterior colporraphy and colpoperineorrhaphy    Current Outpatient Medications  Medication Sig Dispense Refill  . acetaminophen (TYLENOL) 325 MG tablet Take 2 tablets (650 mg total) by mouth every 6 (six) hours as needed for mild pain, moderate pain, fever or headache (or Fever >/= 101). 12 tablet 0  . diltiazem (DILACOR XR) 120 MG 24 hr capsule Take 1 capsule (120 mg total) by mouth daily. 30 capsule 3  . levothyroxine (SYNTHROID) 100 MCG tablet Take 1 tablet (100 mcg total) by mouth daily before breakfast. 30 tablet 1  . losartan (COZAAR) 50 MG tablet Take 1 tablet (50 mg total) by mouth 2 (two) times daily. For BP 60 tablet 5  . polyethylene glycol (MIRALAX / GLYCOLAX) 17 g packet Take 17 g by mouth daily.    Marland Kitchen. sulfamethoxazole-trimethoprim (BACTRIM DS) 800-160 MG tablet Take 1 tablet by mouth 2 (two) times daily for 5  days. 10 tablet 0   No current facility-administered medications for this visit.    Allergies as of 08/28/2019 - Review Complete 08/28/2019  Allergen Reaction Noted  . Atropine Swelling 08/03/2019  . Contrast media [iodinated diagnostic agents]  08/28/2019  . Amlodipine Palpitations 08/03/2019  . Compazine [prochlorperazine edisylate] Anxiety 08/03/2019    No family history on file.  Social History   Socioeconomic History  . Marital status: Widowed    Spouse name: Not on file  . Number  of children: Not on file  . Years of education: Not on file  . Highest education level: Not on file  Occupational History  . Not on file  Tobacco Use  . Smoking status: Never Smoker  . Smokeless tobacco: Never Used  Substance and Sexual Activity  . Alcohol use: Never  . Drug use: Never  . Sexual activity: Not on file  Other Topics Concern  . Not on file  Social History Narrative   Pt recently moved to area    Social Determinants of Health   Financial Resource Strain: Low Risk   . Difficulty of Paying Living Expenses: Not hard at all  Food Insecurity: No Food Insecurity  . Worried About Charity fundraiser in the Last Year: Never true  . Ran Out of Food in the Last Year: Never true  Transportation Needs: No Transportation Needs  . Lack of Transportation (Medical): No  . Lack of Transportation (Non-Medical): No  Physical Activity: Inactive  . Days of Exercise per Week: 0 days  . Minutes of Exercise per Session: 0 min  Stress: No Stress Concern Present  . Feeling of Stress : Only a little  Social Connections: Slightly Isolated  . Frequency of Communication with Friends and Family: More than three times a week  . Frequency of Social Gatherings with Friends and Family: More than three times a week  . Attends Religious Services: More than 4 times per year  . Active Member of Clubs or Organizations: Yes  . Attends Archivist Meetings: More than 4 times per year  . Marital Status: Widowed  Intimate Partner Violence: Not At Risk  . Fear of Current or Ex-Partner: No  . Emotionally Abused: No  . Physically Abused: No  . Sexually Abused: No    Review of Systems: Gen: Denies any fever, chills, fatigue, weight loss, lack of appetite.  CV: Denies chest pain, heart palpitations, peripheral edema, syncope.  Resp: Denies shortness of breath at rest or with exertion. Denies wheezing or cough.  GI: see HPI GU : Denies urinary burning, urinary frequency, urinary hesitancy MS:  Denies joint pain, muscle weakness, cramps, or limitation of movement.  Derm: Denies rash, itching, dry skin Psych: Denies depression, anxiety, memory loss, and confusion Heme: Denies bruising, bleeding, and enlarged lymph nodes.  Physical Exam: BP (!) 179/91   Pulse (!) 118   Temp (!) 96.9 F (36.1 C) (Temporal)   Ht 5' 3.5" (1.613 m)   Wt 159 lb 3.2 oz (72.2 kg)   BMI 27.76 kg/m  General:   Alert and oriented. Pleasant and cooperative. Well-nourished and well-developed.  Head:  Normocephalic and atraumatic. Eyes:  Without icterus, sclera clear and conjunctiva pink.  Lungs:  Clear to auscultation bilaterally. No wheezes, rales, or rhonchi. No distress.  Heart:  S1, S2 present without murmurs appreciated.  Abdomen:  +BS, soft, non-tender and non-distended. No HSM noted. No guarding or rebound. No masses appreciated.  Rectal:  Deferred  Msk:  Symmetrical without  gross deformities. Normal posture. Extremities:  Without edema. Neurologic:  Alert and  oriented x4;  grossly normal neurologically. Skin:  Intact without significant lesions or rashes. Psych:  Alert and cooperative. Normal mood and affect.  ASSESSMENT: Kristy Patterson is an  83 y.o. female presenting today with history of diverticulitis recently, now with continued discomfort post-hospitalization but in setting of constipation. She notes improvement with discomfort after a BM, denying fever/chills, and tolerating diet. Due to significant nausea/vomiting while inpatient, Cipro/Flagyl changed to IV Unasyn. She was discharged on Augmentin but only took one dose due to vomiting. She is hesitant to take further antibiotics unless absolutely necessary. Will maximize bowel regimen and have low threshold for providing course of antibiotics with updated imaging. Physical exam without concerning findings. At some point, would recommend considering an updated colonoscopy; she is hesitant to pursue this as well but will consider.   I did touch  base with her on 12/31 (seen initially on 12/29) and she states first dose of Linzess did well with some improvement in symptoms. Still with nagging feeling. She will start antibiotics after picking up to ensure she has had an appropriate course and provide an update on Monday. Antibiotic regimen somewhat limited due to prior inability to tolerate Cipro/Flagyl, Augmentin. I have provided Bactrim for 5 day course. Ideally would give with Flagyl, but this has caused significant nausea. Next option would be moxifloxacin alone; however, with her older age, renal disease, at increased risk for side effects. Creatinine clearance 43, no renal dosing needed for Bactrim.     PLAN: Linzess 72 mcg daily samples provided.  Start antibiotics Call Monday and pursue CT if no improvement Consider colonoscopy in 6 weeks if patient willing    Gelene Mink, PhD, ANP-BC [facility]

## 2019-08-29 ENCOUNTER — Encounter: Payer: Self-pay | Admitting: Gastroenterology

## 2019-08-30 NOTE — Progress Notes (Signed)
Cc'ed to pcp °

## 2019-09-03 ENCOUNTER — Telehealth: Payer: Self-pay | Admitting: Internal Medicine

## 2019-09-03 NOTE — Telephone Encounter (Signed)
Pt was calling to give nurse a phone report on how she's been doing since seeing AB.  I told her the nurse wasn't available at the moment and I could take a message. She did not want to leave me the message. [phone]

## 2019-09-03 NOTE — Telephone Encounter (Signed)
Lmom, waiting on a return call. Samples are ready for pickup.

## 2019-09-03 NOTE — Telephone Encounter (Signed)
We can provide the Linzess 145 mcg samples.  Please have her check with her PCP about her leg swelling.

## 2019-09-03 NOTE — Telephone Encounter (Signed)
Spoke with pt. Pt was given samples of Linzess 72 mcg at her apt 08/28/2019. Pt says she had bowel movements but doesn't feels like the lowest dose of Linzess worked as well for her. Pt states the stool was still very hard and she has some discomfort in her abdomen. Pt would like to try a stronger dosage of the Linzess. Pt also wanted AB to know that she picked up the antibiotic prescribed and hasn't taken it yet due to her leg swelling.

## 2019-09-04 ENCOUNTER — Telehealth: Payer: Self-pay | Admitting: *Deleted

## 2019-09-04 ENCOUNTER — Ambulatory Visit: Payer: Federal, State, Local not specified - PPO | Admitting: Family Medicine

## 2019-09-04 NOTE — Telephone Encounter (Signed)
Pt is calling returning abbys call

## 2019-09-04 NOTE — Telephone Encounter (Signed)
Spoke with patient.

## 2019-09-10 ENCOUNTER — Telehealth: Payer: Self-pay | Admitting: Gastroenterology

## 2019-09-10 DIAGNOSIS — Z8719 Personal history of other diseases of the digestive system: Secondary | ICD-10-CM

## 2019-09-10 NOTE — Telephone Encounter (Signed)
Spoke with pt. Pt is doing ok. Pt hasn't come by to pick up the Linzess 145 mcg samples. Pt tried an otc laxative and her bowels moved for her. Pt took 2 capsules only of the antibiotic prescribed due to it causing her to feel dehydrated. Pt states she take other medications that cause her mouth to be dry and she drinks about 60 ounces of water daily. Pt states that her pain comes and does on her sides. Pts pain level gets to a 4-5 at times but isn't severe like it was when she was in the hospital. Pt isn't sure if there is another anitbiotic she should try that doesn't cause dryness of her mouth?

## 2019-09-10 NOTE — Telephone Encounter (Signed)
Can we check on patient and see how she is doing?

## 2019-09-12 ENCOUNTER — Ambulatory Visit: Payer: Federal, State, Local not specified - PPO | Admitting: Family Medicine

## 2019-09-12 NOTE — Telephone Encounter (Signed)
Spoke with pt. She has some other appointments this week. Pt would be up to doing a CT scan next week if that's ok?

## 2019-09-12 NOTE — Telephone Encounter (Signed)
At this point, I would recommend a CT to ensure we are not dealing with a smoldering diverticulitis. Would not want to try any additional antibiotics until we know this is what is going on.

## 2019-09-14 NOTE — Telephone Encounter (Signed)
Yes, that is fine.   Please order CT abd/pelvis without IV contrast. Oral only. Next week. Diagnosis: diverticulitis.

## 2019-09-14 NOTE — Addendum Note (Signed)
Addended by: Armstead Peaks on: 09/14/2019 09:48 AM   Modules accepted: Orders

## 2019-09-14 NOTE — Telephone Encounter (Addendum)
CT scheduled for 09/17/2019 Monday at 4:30pm, arrival 4:15pm, npo 4 hrs prior, p/u oral contrast from [facility]  LMOVM

## 2019-09-14 NOTE — Telephone Encounter (Signed)
Called mobile and left detailed VM regarding CT appt.

## 2019-09-17 ENCOUNTER — Ambulatory Visit (HOSPITAL_COMMUNITY)
Admission: RE | Admit: 2019-09-17 | Discharge: 2019-09-17 | Disposition: A | Discharge status: Home / Self Care | Payer: Federal, State, Local not specified - PPO | Source: Ambulatory Visit | Attending: Gastroenterology | Admitting: Gastroenterology

## 2019-09-17 ENCOUNTER — Ambulatory Visit (HOSPITAL_COMMUNITY): Payer: Federal, State, Local not specified - PPO

## 2019-09-17 ENCOUNTER — Other Ambulatory Visit: Payer: Self-pay

## 2019-09-17 DIAGNOSIS — Z8719 Personal history of other diseases of the digestive system: Secondary | ICD-10-CM | POA: Diagnosis present

## 2019-09-18 ENCOUNTER — Other Ambulatory Visit: Payer: Self-pay | Admitting: Gastroenterology

## 2019-09-18 ENCOUNTER — Telehealth: Payer: Self-pay

## 2019-09-18 MED ORDER — ONDANSETRON HCL 4 MG PO TABS: 4.0000 mg | ORAL_TABLET | Freq: Every day | ORAL | 1 refills | Status: DC | PRN

## 2019-09-18 NOTE — Telephone Encounter (Signed)
Pt called to check the status of her CT scan. She knows the report may not be available yet. Pt states the contrast bothered her a little more this time, causing diarrhea. [phone]

## 2019-09-18 NOTE — Telephone Encounter (Signed)
Noted  

## 2019-09-18 NOTE — Telephone Encounter (Signed)
Please see result note 

## 2019-10-10 ENCOUNTER — Encounter: Payer: Self-pay | Admitting: Internal Medicine

## 2019-10-10 NOTE — Progress Notes (Signed)
PATIENT SCHEDULED AND LETTER SENT  °

## 2019-10-15 ENCOUNTER — Other Ambulatory Visit: Payer: Self-pay | Admitting: Gastroenterology

## 2019-10-15 MED ORDER — CIPROFLOXACIN HCL 500 MG PO TABS: 500.0000 mg | ORAL_TABLET | Freq: Two times a day (BID) | ORAL | 0 refills | Status: AC

## 2019-10-15 MED ORDER — METRONIDAZOLE 500 MG PO TABS: 500.0000 mg | ORAL_TABLET | Freq: Three times a day (TID) | ORAL | 0 refills | Status: AC

## 2019-10-30 ENCOUNTER — Other Ambulatory Visit: Payer: Self-pay | Admitting: Family Medicine

## 2019-10-30 ENCOUNTER — Other Ambulatory Visit: Payer: Self-pay | Admitting: *Deleted

## 2019-10-30 MED ORDER — LEVOTHYROXINE SODIUM 100 MCG PO TABS: 100.0000 ug | ORAL_TABLET | Freq: Every day | ORAL | 1 refills | Status: DC

## 2019-11-13 ENCOUNTER — Other Ambulatory Visit: Payer: Self-pay

## 2019-11-13 ENCOUNTER — Encounter: Payer: Self-pay | Admitting: Internal Medicine

## 2019-11-13 ENCOUNTER — Ambulatory Visit: Payer: Federal, State, Local not specified - PPO | Admitting: Internal Medicine

## 2019-11-13 VITALS — BP 201/93 | HR 114 | Temp 96.9°F | Ht 63.0 in | Wt 159.0 lb

## 2019-11-13 DIAGNOSIS — K5790 Diverticulosis of intestine, part unspecified, without perforation or abscess without bleeding: Secondary | ICD-10-CM

## 2019-11-13 DIAGNOSIS — R300 Dysuria: Secondary | ICD-10-CM | POA: Diagnosis not present

## 2019-11-13 DIAGNOSIS — R8290 Unspecified abnormal findings in urine: Secondary | ICD-10-CM

## 2019-11-13 DIAGNOSIS — Z79899 Other long term (current) drug therapy: Secondary | ICD-10-CM

## 2019-11-13 NOTE — Patient Instructions (Addendum)
Urinalysis today  Low residue diet information  May be adding fiber supplement (benefiber ) in the near future  Need a kidney doctor and a primary care physican  Further recommendations to follow

## 2019-11-13 NOTE — Progress Notes (Signed)
Primary Care Physician:  Fayrene Helper, MD Primary Gastroenterologist:  Dr. Gala Romney  Pre-Procedure History & Physical: HPI:  Kristy Patterson is a 84 y.o. female here for follow-up of protracted sigmoid diverticulitis.  Treated with a couple rounds of antibiotics since better part of 2020.  Overall feeling better more recently has some suprapubic discomfort and notes increased urinary frequency.  No fever PT in January indicating improvement of the inflamed segment of sigmoid colon without complete resolution.  If no abscess no free air.  Eating a fiber supplement pretty much eating regular diet.  Has not experienced any fever or chills.  At some point, will be pursuing a diagnostic colonoscopy.  Blood pressure significantly elevated today.  He has moved multiple times.  Has not reestablished with a nephrologist (chronic kidney disease) blood pressure significantly elevated today although she states she did not take her BP medication (diltiazem and losartan) this morning.  Has not had any rectal bleeding states she has been moving her bowels fairly regularly but does not feel she always evacuates.  Felt Linzess 6 MiraLAX was too much for her system.  Uses a suppository to facilitate bowel function on a regular basis.  She cites never being quite normal since having "pelvic floor" surgery x2 in the past [place].  Past Medical History:  Diagnosis Date  . Acquired hypothyroidism 10/06/2015  . Anemia   . Diverticulitis   . GERD (gastroesophageal reflux disease)   . Hypertension   . Hypertensive urgency 02/22/2016  . Irritable bowel syndrome (IBS)   . Precordial chest pain 02/23/2016  . Recurrent UTI 09/18/2015  . Renal disorder   . Sleep apnea   . Stage 3 chronic kidney disease   . Thyroid disease     Past Surgical History:  Procedure Laterality Date  . COLON SURGERY    . HEMORRHOID SURGERY    . PELVIC FLOOR REPAIR     anterior colporrhaphy, posterior colporraphy and colpoperineorrhaphy     Prior to Admission medications   Medication Sig Start Date End Date Taking? Authorizing Provider  acetaminophen (TYLENOL) 325 MG tablet Take 2 tablets (650 mg total) by mouth every 6 (six) hours as needed for mild pain, moderate pain, fever or headache (or Fever >/= 101). Patient taking differently: Take 650 mg by mouth as needed for mild pain, moderate pain, fever or headache (or Fever >/= 101).  08/07/19  Yes Emokpae, Courage, MD  diltiazem (DILACOR XR) 120 MG 24 hr capsule Take 1 capsule (120 mg total) by mouth daily. 08/07/19  Yes Emokpae, Courage, MD  HYDROcodone-acetaminophen (NORCO/VICODIN) 5-325 MG tablet Take 1 tablet by mouth as needed for moderate pain.   Yes [provider]  levothyroxine (SYNTHROID) 100 MCG tablet TAKE 1 TABLET(100 MCG) BY MOUTH DAILY BEFORE BREAKFAST 10/30/19  Yes Fayrene Helper, MD  losartan (COZAAR) 50 MG tablet Take 1 tablet (50 mg total) by mouth 2 (two) times daily. For BP 08/07/19  Yes Emokpae, Courage, MD  polyethylene glycol (MIRALAX / GLYCOLAX) 17 g packet Take 17 g by mouth as needed.    Yes [provider]  ondansetron (ZOFRAN) 4 MG tablet Take 1 tablet (4 mg total) by mouth daily as needed for nausea or vomiting. Patient not taking: Reported on 11/13/2019 09/18/19 09/17/20  Annitta Needs, NP    Allergies as of 11/13/2019 - Review Complete 11/13/2019  Allergen Reaction Noted  . Atropine Swelling 08/03/2019  . Contrast media [iodinated diagnostic agents]  08/28/2019  . Amlodipine  Palpitations 08/03/2019  . Compazine [prochlorperazine edisylate] Anxiety 08/03/2019    No family history on file.  Social History   Socioeconomic History  . Marital status: Widowed    Spouse name: Not on file  . Number of children: Not on file  . Years of education: Not on file  . Highest education level: Not on file  Occupational History  . Not on file  Tobacco Use  . Smoking status: Never Smoker  . Smokeless tobacco: Never Used  Substance  and Sexual Activity  . Alcohol use: Never  . Drug use: Never  . Sexual activity: Not on file  Other Topics Concern  . Not on file  Social History Narrative   Pt recently moved to area    Social Determinants of Health   Financial Resource Strain: Low Risk   . Difficulty of Paying Living Expenses: Not hard at all  Food Insecurity: No Food Insecurity  . Worried About Programme researcher, broadcasting/film/video in the Last Year: Never true  . Ran Out of Food in the Last Year: Never true  Transportation Needs: No Transportation Needs  . Lack of Transportation (Medical): No  . Lack of Transportation (Non-Medical): No  Physical Activity: Inactive  . Days of Exercise per Week: 0 days  . Minutes of Exercise per Session: 0 min  Stress: No Stress Concern Present  . Feeling of Stress : Only a little  Social Connections: Slightly Isolated  . Frequency of Communication with Friends and Family: More than three times a week  . Frequency of Social Gatherings with Friends and Family: More than three times a week  . Attends Religious Services: More than 4 times per year  . Active Member of Clubs or Organizations: Yes  . Attends Banker Meetings: More than 4 times per year  . Marital Status: Widowed  Intimate Partner Violence: Not At Risk  . Fear of Current or Ex-Partner: No  . Emotionally Abused: No  . Physically Abused: No  . Sexually Abused: No    Review of Systems: See HPI, otherwise negative ROS  Physical Exam: BP (!) 201/93   Pulse (!) 114   Temp (!) 96.9 F (36.1 C) (Temporal)   Ht 5\' 3"  (1.6 m)   Wt 159 lb (72.1 kg)   BMI 28.17 kg/m  General:   Alert,   pleasant and cooperative in NAD Neck:  Supple; no masses or thyromegaly. No significant cervical adenopathy. Lungs:  Clear throughout to auscultation.   No wheezes, crackles, or rhonchi. No acute distress. Heart:  Regular rate and rhythm; no murmurs, clicks, rubs,  or gallops. Abdomen: Nondistended.  Positive bowel sounds she does have  mild localized left lower quadrant and suprapubic tenderness.  No appreciable mass organomegaly Pulses:  Normal pulses noted. Extremities:  Without clubbing or edema.  Impression/Plan: 84 year old lady with a protracted course of sigmoid diverticulitis.  Slow to improve.  Improvement noted placed back on January CAT scan.  No urinary tract symptoms at this time that could be indirectly related to recent diverticulitis.  Needs a urinalysis.  She does not look acutely ill or toxic at this time.  He needs to continue on a low residue diet for now.  We will recheck a urinalysis.  Eventually would like to go to a high residue high-fiber diet and adding Benefiber to her regimen.  Ready for colonoscopy as of yet.  Blood pressure is significantly elevated today in the setting of chronic kidney disease.  I doubt she has  a normal blood pressure even when she is taking her losartan and diltiazem.  Is been difficult for her to establish with a primary care and nephrology given her recent moves.  Not mentioned above, she currently lives in [facility] and is hopeful this is where she will remain for the long term.  I strongly recommend she have her blood pressure rechecked up later in the week.  Low residue diet for the time being check urinalysis results when they become available  Further recommendations to follow in the near future.     Notice: This dictation was prepared with Dragon dictation along with smaller phrase technology. Any transcriptional errors that result from this process are unintentional and may not be corrected upon review.

## 2019-11-14 LAB — URINALYSIS
Bilirubin Urine: NEGATIVE
Glucose, UA: NEGATIVE
Hgb urine dipstick: NEGATIVE
Ketones, ur: NEGATIVE
Nitrite: NEGATIVE
Protein, ur: NEGATIVE
Specific Gravity, Urine: 1.008 (ref 1.001–1.03)
pH: 6 (ref 5.0–8.0)

## 2019-11-20 ENCOUNTER — Ambulatory Visit: Payer: Federal, State, Local not specified - PPO | Admitting: Family Medicine

## 2019-11-21 ENCOUNTER — Encounter: Payer: Self-pay | Admitting: Family Medicine

## 2019-12-04 ENCOUNTER — Other Ambulatory Visit: Payer: Self-pay

## 2019-12-04 ENCOUNTER — Ambulatory Visit (INDEPENDENT_AMBULATORY_CARE_PROVIDER_SITE_OTHER): Payer: Federal, State, Local not specified - PPO | Admitting: Family Medicine

## 2019-12-04 ENCOUNTER — Encounter: Payer: Self-pay | Admitting: Family Medicine

## 2019-12-04 VITALS — BP 180/78 | HR 103 | Temp 96.3°F | Resp 15 | Ht 63.0 in | Wt 160.0 lb

## 2019-12-04 DIAGNOSIS — K5790 Diverticulosis of intestine, part unspecified, without perforation or abscess without bleeding: Secondary | ICD-10-CM

## 2019-12-04 DIAGNOSIS — K6389 Other specified diseases of intestine: Secondary | ICD-10-CM

## 2019-12-04 DIAGNOSIS — E538 Deficiency of other specified B group vitamins: Secondary | ICD-10-CM

## 2019-12-04 DIAGNOSIS — I10 Essential (primary) hypertension: Secondary | ICD-10-CM | POA: Diagnosis not present

## 2019-12-04 MED ORDER — SULFAMETHOXAZOLE-TRIMETHOPRIM 800-160 MG PO TABS: 1.0000 | ORAL_TABLET | Freq: Two times a day (BID) | ORAL | 0 refills | Status: AC

## 2019-12-04 MED ORDER — SACCHAROMYCES BOULARDII 250 MG PO CAPS: 250.0000 mg | ORAL_CAPSULE | Freq: Two times a day (BID) | ORAL | 1 refills | Status: DC

## 2019-12-04 MED ORDER — HYDROCHLOROTHIAZIDE 12.5 MG PO TABS: 12.5000 mg | ORAL_TABLET | Freq: Every day | ORAL | 1 refills | Status: DC

## 2019-12-04 MED ORDER — CYANOCOBALAMIN 1000 MCG/ML IJ SOLN
1000.0000 ug | Freq: Once | INTRAMUSCULAR | Status: AC
  Administered 2019-12-04: 1000 ug via INTRAMUSCULAR

## 2019-12-04 NOTE — Assessment & Plan Note (Signed)
Kristy Patterson is encouraged to maintain a well balanced diet that is low in salt. Not Controlled, continue current medication regimen with addition of hydrochlorothiazide  Additionally, she is also reminded that exercise is beneficial for heart health and control of  Blood pressure. 30-60 minutes daily is recommended-walking was suggested.

## 2019-12-04 NOTE — Patient Instructions (Addendum)
I appreciate the opportunity to provide you with care for your health and wellness. Today we discussed: overall care  Follow up: 4-5 months for annual visit   No labs or referrals today  Start Bactrim take twice daily for 7 days Increase water intake Take probiotic twice daily  Please continue to practice social distancing to keep you, your family, and our community safe.  If you must go out, please wear a mask and practice good handwashing.  It was a pleasure to see you and I look forward to continuing to work together on your health and well-being. Please do not hesitate to call the office if you need care or have questions about your care.  Have a wonderful day and week. With Gratitude, Tereasa Coop, DNP, AGNP-BC  Low-FODMAP Eating Plan  FODMAPs (fermentable oligosaccharides, disaccharides, monosaccharides, and polyols) are sugars that are hard for some people to digest. A low-FODMAP eating plan may help some people who have bowel (intestinal) diseases to manage their symptoms. This meal plan can be complicated to follow. Work with a diet and nutrition specialist (dietitian) to make a low-FODMAP eating plan that is right for you. A dietitian can make sure that you get enough nutrition from this diet. What are tips for following this plan? Reading food labels  Check labels for hidden FODMAPs such as: ? High-fructose syrup. ? Honey. ? Agave. ? Natural fruit flavors. ? Onion or garlic powder.  Choose low-FODMAP foods that contain 3-4 grams of fiber per serving.  Check food labels for serving sizes. Eat only one serving at a time to make sure FODMAP levels stay low. Meal planning  Follow a low-FODMAP eating plan for up to 6 weeks, or as told by your health care provider or dietitian.  To follow the eating plan: 1. Eliminate high-FODMAP foods from your diet completely. 2. Gradually reintroduce high-FODMAP foods into your diet one at a time. Most people should wait a few days  after introducing one high-FODMAP food before they introduce the next high-FODMAP food. Your dietitian can recommend how quickly you may reintroduce foods. 3. Keep a daily record of what you eat and drink, and make note of any symptoms that you have after eating. 4. Review your daily record with a dietitian regularly. Your dietitian can help you identify which foods you can eat and which foods you should avoid. General tips  Drink enough fluid each day to keep your urine pale yellow.  Avoid processed foods. These often have added sugar and may be high in FODMAPs.  Avoid most dairy products, whole grains, and sweeteners.  Work with a dietitian to make sure you get enough fiber in your diet. Recommended foods Grains  Gluten-free grains, such as rice, oats, buckwheat, quinoa, corn, polenta, and millet. Gluten-free pasta, bread, or cereal. Rice noodles. Corn tortillas. Vegetables  Eggplant, zucchini, cucumber, peppers, green beans, Brussels sprouts, bean sprouts, lettuce, arugula, kale, Swiss chard, spinach, collard greens, bok choy, summer squash, potato, and tomato. Limited amounts of corn, carrot, and sweet potato. Green parts of scallions. Fruits  Bananas, oranges, lemons, limes, blueberries, raspberries, strawberries, grapes, cantaloupe, honeydew melon, kiwi, papaya, passion fruit, and pineapple. Limited amounts of dried cranberries, banana chips, and shredded coconut. Dairy  Lactose-free milk, yogurt, and kefir. Lactose-free cottage cheese and ice cream. Non-dairy milks, such as almond, coconut, hemp, and rice milk. Yogurts made of non-dairy milks. Limited amounts of goat cheese, brie, mozzarella, parmesan, swiss, and other hard cheeses. Meats and other protein foods  Unseasoned  beef, pork, poultry, or fish. Eggs. Berniece Salines. Tofu (firm) and tempeh. Limited amounts of nuts and seeds, such as almonds, walnuts, [place] nuts, pecans, peanuts, pumpkin seeds, chia seeds, and sunflower seeds. Fats  and oils  Butter-free spreads. Vegetable oils, such as olive, canola, and sunflower oil. Seasoning and other foods  Artificial sweeteners with names that do not end in "ol" such as aspartame, saccharine, and stevia. Maple syrup, white table sugar, raw sugar, brown sugar, and molasses. Fresh basil, coriander, parsley, rosemary, and thyme. Beverages  Water and mineral water. Sugar-sweetened soft drinks. Small amounts of orange juice or cranberry juice. Black and green tea. Most dry wines. Coffee. This may not be a complete list of low-FODMAP foods. Talk with your dietitian for more information. Foods to avoid Grains  Wheat, including kamut, durum, and semolina. Barley and bulgur. Couscous. Wheat-based cereals. Wheat noodles, bread, crackers, and pastries. Vegetables  Chicory root, artichoke, asparagus, cabbage, snow peas, sugar snap peas, mushrooms, and cauliflower. Onions, garlic, leeks, and the white part of scallions. Fruits  Fresh, dried, and juiced forms of apple, pear, watermelon, peach, plum, cherries, apricots, blackberries, boysenberries, figs, nectarines, and mango. Avocado. Dairy  Milk, yogurt, ice cream, and soft cheese. Cream and sour cream. Milk-based sauces. Custard. Meats and other protein foods  Fried or fatty meat. Sausage. Cashews and pistachios. Soybeans, baked beans, black beans, chickpeas, kidney beans, fava beans, navy beans, lentils, and split peas. Seasoning and other foods  Any sugar-free gum or candy. Foods that contain artificial sweeteners such as sorbitol, mannitol, isomalt, or xylitol. Foods that contain honey, high-fructose corn syrup, or agave. Bouillon, vegetable stock, beef stock, and chicken stock. Garlic and onion powder. Condiments made with onion, such as hummus, chutney, pickles, relish, salad dressing, and salsa. Tomato paste. Beverages  Chicory-based drinks. Coffee substitutes. Chamomile tea. Fennel tea. Sweet or fortified wines such as port or  sherry. Diet soft drinks made with isomalt, mannitol, maltitol, sorbitol, or xylitol. Apple, pear, and mango juice. Juices with high-fructose corn syrup. This may not be a complete list of high-FODMAP foods. Talk with your dietitian to discuss what dietary choices are best for you.  Summary  A low-FODMAP eating plan is a short-term diet that eliminates FODMAPs from your diet to help ease symptoms of certain bowel diseases.  The eating plan usually lasts up to 6 weeks. After that, high-FODMAP foods are restarted gradually, one at a time, so you can find out which may be causing symptoms.  A low-FODMAP eating plan can be complicated. It is best to work with a dietitian who has experience with this type of plan. This information is not intended to replace advice given to you by your health care provider. Make sure you discuss any questions you have with your health care provider. Document Revised: 07/29/2017 Document Reviewed: 04/12/2017 Elsevier Patient Education  [place].

## 2019-12-04 NOTE — Addendum Note (Signed)
Addended by: Abner Greenspan on: 12/04/2019 03:51 PM   Modules accepted: Orders

## 2019-12-04 NOTE — Assessment & Plan Note (Signed)
Treating for intestinal bacterial overgrowth.  Reports she does not feel like she would be able to take rifaximin will try Bactrim.  In addition provided her with a low mod food diet plan and education on the need for fiber in the diet.  Also started on probiotics twice daily.  Close follow-up with GI coming up in May.

## 2019-12-04 NOTE — Progress Notes (Signed)
Subjective:  Patient ID: Kristy Patterson, female    DOB: [birth year]  Age: 84 y.o. MRN: [id number]  CC:  Chief Complaint  Patient presents with  . Diverticulosis    still bothering her,somedays since her Dec admission for diverticulitis.  today its not hurting bad but lastnight it bothered her and states she has excessive soft bowel movements  . B12 Injection    overdue for b12 injection       HPI  HPI  Is an 84 year old female patient who presents today to follow-up on her diverticulosis and get her B12 injection.  She had diverticulitis back in December was admitted to the hospital at that time.  Was provided with treatment and to follow-up with Dr. Jeanella Flattery needs to have a colonoscopy as they did find a mass and would like to follow-up on that.  But has not yet proceeded to do that secondary to her slow healing process.  Please see note in Epic.  Today she reports that over the last 4 months since her admission she has had discomfort.  Lower abdomen.  Duration depends on how she is feeling and how she goes to the bathroom.  She describes as a sharp discomfort it goes over her bladder area as well she was tested for a urinary tract infection when she went to see GI but she was negative.  She reports predominantly it aggravates her at nighttime wakes her up from sleep.  In the past she has taken Cipro from her GI doctor from where she used to live and that helped.  She reports is a 5 out of 10 with discomfort.  Is not taking any probiotics.  Reports that she has been diagnosed with intestinal bacterial overgrowth which is why she took Cipro when she thinks that that is causing some of her problems right now.  Today patient denies signs and symptoms of COVID 19 infection including fever, chills, cough, shortness of breath, and headache. Past Medical, Surgical, Social History, Allergies, and Medications have been Reviewed.   Past Medical History:  Diagnosis Date  . Acquired hypothyroidism  10/06/2015  . Anemia   . Diverticulitis   . GERD (gastroesophageal reflux disease)   . Hypertension   . Hypertensive urgency 02/22/2016  . Irritable bowel syndrome (IBS)   . Precordial chest pain 02/23/2016  . Recurrent UTI 09/18/2015  . Renal disorder   . Sleep apnea   . Stage 3 chronic kidney disease   . Thyroid disease     Current Meds  Medication Sig  . acetaminophen (TYLENOL) 325 MG tablet Take 2 tablets (650 mg total) by mouth every 6 (six) hours as needed for mild pain, moderate pain, fever or headache (or Fever >/= 101). (Patient taking differently: Take 650 mg by mouth as needed for mild pain, moderate pain, fever or headache (or Fever >/= 101). )  . diltiazem (DILACOR XR) 120 MG 24 hr capsule Take 1 capsule (120 mg total) by mouth daily.  Marland Kitchen HYDROcodone-acetaminophen (NORCO/VICODIN) 5-325 MG tablet Take 1 tablet by mouth as needed for moderate pain.  Marland Kitchen levothyroxine (SYNTHROID) 100 MCG tablet TAKE 1 TABLET(100 MCG) BY MOUTH DAILY BEFORE BREAKFAST  . losartan (COZAAR) 50 MG tablet Take 1 tablet (50 mg total) by mouth 2 (two) times daily. For BP  . polyethylene glycol (MIRALAX / GLYCOLAX) 17 g packet Take 17 g by mouth as needed.     ROS:  Review of Systems  Constitutional: Negative.   HENT: Negative.  Eyes: Negative.   Respiratory: Negative.   Cardiovascular: Negative.   Gastrointestinal: Positive for abdominal pain and constipation.  Genitourinary: Negative.   Musculoskeletal: Negative.   Skin: Negative.   Neurological: Negative.   Endo/Heme/Allergies: Negative.   Psychiatric/Behavioral: Negative.   All other systems reviewed and are negative.    Objective:   Today's Vitals: BP (!) 180/78   Pulse (!) 103   Temp (!) 96.3 F (35.7 C) (Temporal)   Resp 15   Ht 5\' 3"  (1.6 m)   Wt 160 lb (72.6 kg)   SpO2 98%   BMI 28.34 kg/m  Vitals with BMI 12/04/2019 11/13/2019 08/28/2019  Height 5\' 3"  5\' 3"  5' 3.5"  Weight 160 lbs 159 lbs 159 lbs 3 oz  BMI 28.35 24.23 53.61    Systolic 443 154 008  Diastolic 78 93 91  Pulse 676 114 118     Physical Exam Vitals and nursing note reviewed.  Constitutional:      Appearance: Normal appearance. She is well-developed, well-groomed and overweight.  HENT:     Head: Normocephalic and atraumatic.     Right Ear: External ear normal.     Left Ear: External ear normal.     Mouth/Throat:     Comments: Mask in place   Eyes:     General:        Right eye: No discharge.        Left eye: No discharge.     Conjunctiva/sclera: Conjunctivae normal.  Cardiovascular:     Rate and Rhythm: Normal rate and regular rhythm.     Pulses: Normal pulses.     Heart sounds: Normal heart sounds.  Pulmonary:     Effort: Pulmonary effort is normal.     Breath sounds: Normal breath sounds.  Musculoskeletal:        General: Normal range of motion.     Cervical back: Normal range of motion and neck supple.  Skin:    General: Skin is warm.  Neurological:     General: No focal deficit present.     Mental Status: She is alert and oriented to person, place, and time.  Psychiatric:        Attention and Perception: Attention normal.        Mood and Affect: Mood normal.        Speech: Speech normal.        Behavior: Behavior normal. Behavior is cooperative.        Thought Content: Thought content normal.        Cognition and Memory: Cognition normal.        Judgment: Judgment normal.     Assessment   1. Essential hypertension   2. Diverticulosis   3. Intestinal bacterial overgrowth     Tests ordered No orders of the defined types were placed in this encounter.    Plan: Please see assessment and plan per problem list above.   Meds ordered this encounter  Medications  . sulfamethoxazole-trimethoprim (BACTRIM DS) 800-160 MG tablet    Sig: Take 1 tablet by mouth 2 (two) times daily for 7 days.    Dispense:  14 tablet    Refill:  0    Order Specific Question:   Supervising Provider    Answer:   SIMPSON, MARGARET E  [[id number]]  . saccharomyces boulardii (FLORASTOR) 250 MG capsule    Sig: Take 1 capsule (250 mg total) by mouth 2 (two) times daily.    Dispense:  30 capsule  Refill:  1    Order Specific Question:   Supervising Provider    Answer:   SIMPSON, MARGARET E [[id number]]  . hydrochlorothiazide (HYDRODIURIL) 12.5 MG tablet    Sig: Take 1 tablet (12.5 mg total) by mouth daily.    Dispense:  30 tablet    Refill:  1    Order Specific Question:   Supervising Provider    Answer:   Kerri Perches [[id number]]    Patient to follow-up in August for annual  Notice: Thisdictation was prepared with Dragon dictation along with smaller phrase technology. Any transcriptional errors that result from this process are unintentional and may not be corrected upon review.  Freddy Finner, NP

## 2019-12-04 NOTE — Assessment & Plan Note (Addendum)
Is going to be following up with GI in [place] close to her home now.  Detailed diet provided with written information.

## 2019-12-31 ENCOUNTER — Ambulatory Visit (INDEPENDENT_AMBULATORY_CARE_PROVIDER_SITE_OTHER): Payer: Federal, State, Local not specified - PPO | Admitting: Gastroenterology

## 2019-12-31 ENCOUNTER — Encounter: Payer: Self-pay | Admitting: Gastroenterology

## 2019-12-31 ENCOUNTER — Other Ambulatory Visit: Payer: Self-pay

## 2019-12-31 VITALS — BP 206/73 | HR 111 | Temp 97.8°F | Ht 63.0 in | Wt 160.0 lb

## 2019-12-31 DIAGNOSIS — R935 Abnormal findings on diagnostic imaging of other abdominal regions, including retroperitoneum: Secondary | ICD-10-CM

## 2019-12-31 NOTE — Progress Notes (Signed)
Gastroenterology Consultation  Referring Provider:     Freddy Finner, NP Primary Care Physician:  Kerri Perches, MD Primary Gastroenterologist:  Dr. Servando Snare     Reason for Consultation:     Transfer of care        HPI:   Kristy Patterson is a 84 y.o. y/o female referred for consultation & management of transfer of care by Dr. Lodema Hong, Milus Mallick, MD.  This patient comes in today after being seen at [facility] by Dr. Ellison Carwin for some time.  The patient has had a history of diverticulitis with a slow improvement with a reported CT scan in January showing some improvement but persistent inflammation in the left lower quadrant.  The patient was seen in the middle of March by her previous gastroenterologist and states that she lives in [place] and wanted to establish care with somebody closer.  There is noted persistent thickening of the sigmoid colon which was reported to be possibly caused by the diverticulitis versus a mass that cannot be ruled out. The patient denies any unexplained weight loss fevers chills nausea or vomiting.  She does report that she has had pencillike stools for some time.  She is also reported that the thickening of her colon has been seen on multiple CT scans in the last few years.  Past Medical History:  Diagnosis Date  . Acquired hypothyroidism 10/06/2015  . Anemia   . Diverticulitis   . GERD (gastroesophageal reflux disease)   . Hypertension   . Hypertensive urgency 02/22/2016  . Irritable bowel syndrome (IBS)   . Precordial chest pain 02/23/2016  . Recurrent UTI 09/18/2015  . Renal disorder   . Sleep apnea   . Stage 3 chronic kidney disease   . Thyroid disease     Past Surgical History:  Procedure Laterality Date  . COLON SURGERY    . HEMORRHOID SURGERY    . PELVIC FLOOR REPAIR     anterior colporrhaphy, posterior colporraphy and colpoperineorrhaphy    Prior to Admission medications   Medication Sig Start Date End Date Taking? Authorizing Provider    acetaminophen (TYLENOL) 325 MG tablet Take 2 tablets (650 mg total) by mouth every 6 (six) hours as needed for mild pain, moderate pain, fever or headache (or Fever >/= 101). Patient taking differently: Take 650 mg by mouth as needed for mild pain, moderate pain, fever or headache (or Fever >/= 101).  08/07/19   Shon Hale, MD  diltiazem (DILACOR XR) 120 MG 24 hr capsule Take 1 capsule (120 mg total) by mouth daily. 08/07/19   Shon Hale, MD  hydrochlorothiazide (HYDRODIURIL) 12.5 MG tablet Take 1 tablet (12.5 mg total) by mouth daily. 12/04/19   Freddy Finner, NP  HYDROcodone-acetaminophen (NORCO/VICODIN) 5-325 MG tablet Take 1 tablet by mouth as needed for moderate pain.    [provider]  levothyroxine (SYNTHROID) 100 MCG tablet TAKE 1 TABLET(100 MCG) BY MOUTH DAILY BEFORE BREAKFAST 10/30/19   Kerri Perches, MD  losartan (COZAAR) 50 MG tablet Take 1 tablet (50 mg total) by mouth 2 (two) times daily. For BP 08/07/19   Emokpae, Courage, MD  polyethylene glycol (MIRALAX / GLYCOLAX) 17 g packet Take 17 g by mouth as needed.     [provider]  saccharomyces boulardii (FLORASTOR) 250 MG capsule Take 1 capsule (250 mg total) by mouth 2 (two) times daily. 12/04/19   Freddy Finner, NP    No family history on file.   Social  History   Tobacco Use  . Smoking status: Never Smoker  . Smokeless tobacco: Never Used  Substance Use Topics  . Alcohol use: Never  . Drug use: Never    Allergies as of 12/31/2019 - Review Complete 12/04/2019  Allergen Reaction Noted  . Atropine Swelling 08/03/2019  . Contrast media [iodinated diagnostic agents]  08/28/2019  . Amlodipine Palpitations 08/03/2019  . Compazine [prochlorperazine edisylate] Anxiety 08/03/2019    Review of Systems:    All systems reviewed and negative except where noted in HPI.   Physical Exam:  There were no vitals taken for this visit. No LMP recorded. Patient has had a hysterectomy. General:   Alert,   Well-developed, well-nourished, pleasant and cooperative in NAD Head:  Normocephalic and atraumatic. Eyes:  Sclera clear, no icterus.   Conjunctiva pink. Ears:  Normal auditory acuity. Neck:  Supple; no masses or thyromegaly. Lungs:  Respirations even and unlabored.  Clear throughout to auscultation.   No wheezes, crackles, or rhonchi. No acute distress. Heart:  Regular rate and rhythm; no murmurs, clicks, rubs, or gallops. Abdomen:  Normal bowel sounds.  No bruits.  Soft, non-tender and non-distended without masses, hepatosplenomegaly or hernias noted.  No guarding or rebound tenderness.  Negative Carnett sign.   Rectal:  Deferred.  Pulses:  Normal pulses noted. Extremities:  No clubbing or edema.  No cyanosis. Neurologic:  Alert and oriented x3;  grossly normal neurologically. Skin:  Intact without significant lesions or rashes.  No jaundice. Lymph Nodes:  No significant cervical adenopathy. Psych:  Alert and cooperative. Normal mood and affect.  Imaging Studies: No results found.  Assessment and Plan:   Kristy Patterson is a 84 y.o. y/o female who comes in today with thickening of the sigmoid colon which may be related to her recurrent diverticulitis versus a neoplasm.  The CT scan recommended direct visualization with a colonoscopy and that was also the recommendation of her previous gastroenterologist in [place].  The patient states that her daughter was opposed to it but since she now knows that the CT scan is recommended a colonoscopy and that since she had diverticulitis a neoplasm should be ruled out she now states that she will think about it and will contact us if she decides to set the procedure up.    Lucilla Lame, MD. Marval Regal    Note: This dictation was prepared with Dragon dictation along with smaller phrase technology. Any transcriptional errors that result from this process are unintentional.

## 2020-01-01 ENCOUNTER — Ambulatory Visit: Payer: Federal, State, Local not specified - PPO | Admitting: Internal Medicine

## 2020-01-25 ENCOUNTER — Other Ambulatory Visit: Payer: Self-pay

## 2020-01-25 ENCOUNTER — Emergency Department
Admission: EM | Admit: 2020-01-25 | Discharge: 2020-01-25 | Disposition: A | Discharge status: Home / Self Care | Payer: Federal, State, Local not specified - PPO | Attending: Emergency Medicine | Admitting: Emergency Medicine

## 2020-01-25 ENCOUNTER — Emergency Department: Payer: Federal, State, Local not specified - PPO

## 2020-01-25 DIAGNOSIS — N183 Chronic kidney disease, stage 3 unspecified: Secondary | ICD-10-CM | POA: Insufficient documentation

## 2020-01-25 DIAGNOSIS — R195 Other fecal abnormalities: Secondary | ICD-10-CM | POA: Diagnosis not present

## 2020-01-25 DIAGNOSIS — K632 Fistula of intestine: Secondary | ICD-10-CM | POA: Diagnosis not present

## 2020-01-25 DIAGNOSIS — N390 Urinary tract infection, site not specified: Secondary | ICD-10-CM | POA: Diagnosis not present

## 2020-01-25 DIAGNOSIS — I129 Hypertensive chronic kidney disease with stage 1 through stage 4 chronic kidney disease, or unspecified chronic kidney disease: Secondary | ICD-10-CM | POA: Insufficient documentation

## 2020-01-25 DIAGNOSIS — Z79899 Other long term (current) drug therapy: Secondary | ICD-10-CM | POA: Insufficient documentation

## 2020-01-25 DIAGNOSIS — R1030 Lower abdominal pain, unspecified: Secondary | ICD-10-CM | POA: Diagnosis present

## 2020-01-25 DIAGNOSIS — E039 Hypothyroidism, unspecified: Secondary | ICD-10-CM | POA: Diagnosis not present

## 2020-01-25 LAB — URINALYSIS, COMPLETE (UACMP) WITH MICROSCOPIC
Bilirubin Urine: NEGATIVE
Glucose, UA: NEGATIVE mg/dL
Ketones, ur: NEGATIVE mg/dL
Nitrite: NEGATIVE
Protein, ur: NEGATIVE mg/dL
Specific Gravity, Urine: 1.009 (ref 1.005–1.030)
WBC, UA: >50 WBC/hpf — ABNORMAL HIGH (ref 0–5)
pH: 6 (ref 5.0–8.0)

## 2020-01-25 LAB — COMPREHENSIVE METABOLIC PANEL
ALT: 12 U/L (ref 0–44)
AST: 18 U/L (ref 15–41)
Albumin: 4.1 g/dL (ref 3.5–5.0)
Alkaline Phosphatase: 84 U/L (ref 38–126)
Anion gap: 10 (ref 5–15)
BUN: 20 mg/dL (ref 8–23)
CO2: 25 mmol/L (ref 22–32)
Calcium: 10 mg/dL (ref 8.9–10.3)
Chloride: 102 mmol/L (ref 98–111)
Creatinine, Ser: 1.41 mg/dL — ABNORMAL HIGH (ref 0.44–1.00)
GFR calc Af Amer: 40 mL/min — ABNORMAL LOW (ref >60)
GFR calc non Af Amer: 35 mL/min — ABNORMAL LOW (ref >60)
Glucose, Bld: 107 mg/dL — ABNORMAL HIGH (ref 70–99)
Potassium: 4 mmol/L (ref 3.5–5.1)
Sodium: 137 mmol/L (ref 135–145)
Total Bilirubin: 0.6 mg/dL (ref 0.3–1.2)
Total Protein: 8 g/dL (ref 6.5–8.1)

## 2020-01-25 LAB — LIPASE, BLOOD: Lipase: 36 U/L (ref 11–51)

## 2020-01-25 LAB — CBC
HCT: 39.4 % (ref 36.0–46.0)
Hemoglobin: 12.5 g/dL (ref 12.0–15.0)
MCH: 28.6 pg (ref 26.0–34.0)
MCHC: 31.7 g/dL (ref 30.0–36.0)
MCV: 90.2 fL (ref 80.0–100.0)
Platelets: 180 10*3/uL (ref 150–400)
RBC: 4.37 MIL/uL (ref 3.87–5.11)
RDW: 14.7 % (ref 11.5–15.5)
WBC: 7.4 10*3/uL (ref 4.0–10.5)
nRBC: 0 % (ref 0.0–0.2)

## 2020-01-25 MED ORDER — DICYCLOMINE HCL 10 MG PO CAPS: 10.0000 mg | ORAL_CAPSULE | Freq: Three times a day (TID) | ORAL | 0 refills | Status: DC | PRN

## 2020-01-25 MED ORDER — ONDANSETRON 4 MG PO TBDP
4.0000 mg | ORAL_TABLET | Freq: Once | ORAL | Status: AC
  Administered 2020-01-25: 4 mg via ORAL
  Filled 2020-01-25: qty 1

## 2020-01-25 MED ORDER — ONDANSETRON 4 MG PO TBDP: 4.0000 mg | ORAL_TABLET | Freq: Four times a day (QID) | ORAL | 0 refills | Status: DC | PRN

## 2020-01-25 MED ORDER — AMOXICILLIN-POT CLAVULANATE 400-57 MG/5ML PO SUSR
800.0000 mg | ORAL | Status: AC
  Administered 2020-01-25: 800 mg via ORAL
  Filled 2020-01-25: qty 10

## 2020-01-25 MED ORDER — AMOXICILLIN 400 MG/5ML PO SUSR
500.0000 mg | Freq: Two times a day (BID) | ORAL | 0 refills | Status: DC
Start: 2020-01-25 — End: 2020-01-26

## 2020-01-25 NOTE — Discharge Instructions (Addendum)
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  Please make appointments to follow-up with both our general surgeon as well as gynecology team.  It also appears you have a urinary tract infection as well.  Please return to the emergency room right away if you are to develop a fever, severe nausea, your pain becomes severe or worsens, you are unable to keep food down, begin vomiting any dark or bloody fluid, you develop any dark or bloody stools, feel dehydrated, or other new concerns or symptoms arise.

## 2020-01-25 NOTE — ED Provider Notes (Signed)
Patient reports that she would like a prescription for some to help with intermittent episodes of abdominal pain.  She reports they seem to come seem to be worse at night.  We will trial a prescription of Bentyl as well for her.   Sharyn Creamer, MD 01/25/20 [phone]

## 2020-01-25 NOTE — ED Triage Notes (Signed)
Pt reports having lower abd pain for the past couple days, pt states that she was diagnosed with diverticulitis in sept. Pt states that she has been noticing something that appears to be stool coming from her vagina too.

## 2020-01-25 NOTE — ED Provider Notes (Signed)
[facility] Provider Note   ____________________________________________   First MD Initiated Contact with Patient 01/25/20 1605     (approximate)  I have reviewed the triage vital signs and the nursing notes.   HISTORY  Chief Complaint Abdominal Pain    HPI Kristy Patterson is a 84 y.o. female here for evaluation of possible stool coming from her vagina   Patient reports for about 2 weeks now she has noticed what seems to be stool-like material occasionally coming from the vagina.  Is associated with mild lower abdominal pain.  She reports a long history of fairly significant chronic lower abdominal pain and diverticulitis episodes.  She saw GI about 1 month ago, was advised to get a colonoscopy but felt she did not wish to have it at that point.  However she reports she called their office today as she is noticed for 2 weeks now discharge from the vagina that she believes to be stool.  She is had no fevers or chills.  No nausea or vomiting.  Had 2 normal formed bowel movements yesterday.  No known Covid exposure.  No pain or burning with urination but has noticed that her urine seems a little bit cloudy the last several days as well  Past Medical History:  Diagnosis Date  . Acquired hypothyroidism 10/06/2015  . Anemia   . Diverticulitis   . GERD (gastroesophageal reflux disease)   . Hypertension   . Hypertensive urgency 02/22/2016  . Irritable bowel syndrome (IBS)   . Precordial chest pain 02/23/2016  . Recurrent UTI 09/18/2015  . Renal disorder   . Sleep apnea   . Stage 3 chronic kidney disease   . Thyroid disease     Patient Active Problem List   Diagnosis Date Noted  . Intestinal bacterial overgrowth 12/04/2019  . History of diverticulitis 08/28/2019  . History of constipation 08/28/2019  . Elevated TSH 08/25/2019  . Hypothyroidism 08/25/2019  . Vitamin D deficiency 08/25/2019  . Irritable bowel syndrome with constipation  08/25/2019  . Diverticulitis 08/25/2019  . Constipation 08/21/2019  . Acute diverticulitis of intestine 08/03/2019  . CKD (chronic kidney disease), stage III 02/23/2016  . Diverticulosis 02/23/2016  . Dizziness 02/23/2016  . Generalized abdominal pain 02/23/2016  . GERD (gastroesophageal reflux disease) 09/18/2015  . B12 deficiency 09/18/2015  . Essential hypertension 09/18/2015  . Hypothyroidism due to acquired atrophy of thyroid 12/03/2014  . Cervical stenosis of spinal canal 01/06/2014  . Low back pain 03/23/2013  . Generalized anxiety disorder 03/06/2013  . Vaginal prolapse 03/14/2012  . Hyperlipidemia 04/03/2008  . B12 deficiency anemia 07/23/2007    Past Surgical History:  Procedure Laterality Date  . COLON SURGERY    . HEMORRHOID SURGERY    . PELVIC FLOOR REPAIR     anterior colporrhaphy, posterior colporraphy and colpoperineorrhaphy    Prior to Admission medications   Medication Sig Start Date End Date Taking? Authorizing Provider  acetaminophen (TYLENOL) 325 MG tablet Take 2 tablets (650 mg total) by mouth every 6 (six) hours as needed for mild pain, moderate pain, fever or headache (or Fever >/= 101). Patient taking differently: Take 650 mg by mouth as needed for mild pain, moderate pain, fever or headache (or Fever >/= 101).  08/07/19   Shon Hale, MD  amoxicillin (AMOXIL) 400 MG/5ML suspension Take 6.3 mLs (500 mg total) by mouth 2 (two) times daily. 01/25/20   Sharyn Creamer, MD  cyanocobalamin 1000 MCG tablet Inject into the muscle.  [provider]  diltiazem (DILACOR XR) 120 MG 24 hr capsule Take 1 capsule (120 mg total) by mouth daily. 08/07/19   Shon Hale, MD  diltiazem (TIAZAC) 120 MG 24 hr capsule Take by mouth.    [provider]  Docusate Sodium (DSS) 100 MG CAPS Take 100 mg by mouth daily as needed (mild constipation).  03/14/12   [provider]  estradiol (ESTRACE) 0.1 MG/GM vaginal cream Place vaginally. 03/15/12    [provider]  hydrochlorothiazide (HYDRODIURIL) 12.5 MG tablet Take 1 tablet (12.5 mg total) by mouth daily. 12/04/19   Freddy Finner, NP  HYDROcodone-acetaminophen (NORCO/VICODIN) 5-325 MG tablet Take 1 tablet by mouth as needed for moderate pain.    [provider]  levothyroxine (SYNTHROID) 100 MCG tablet TAKE 1 TABLET(100 MCG) BY MOUTH DAILY BEFORE BREAKFAST 10/30/19   Kerri Perches, MD  losartan (COZAAR) 50 MG tablet Take 1 tablet (50 mg total) by mouth 2 (two) times daily. For BP 08/07/19   Emokpae, Courage, MD  ondansetron (ZOFRAN ODT) 4 MG disintegrating tablet Take 1 tablet (4 mg total) by mouth every 6 (six) hours as needed for nausea or vomiting. 01/25/20   Sharyn Creamer, MD  polyethylene glycol (MIRALAX / GLYCOLAX) 17 g packet Take 17 g by mouth as needed.     [provider]  saccharomyces boulardii (FLORASTOR) 250 MG capsule Take 1 capsule (250 mg total) by mouth 2 (two) times daily. 12/04/19   Freddy Finner, NP    Allergies Atropine, Contrast media [iodinated diagnostic agents], Amlodipine, Compazine [prochlorperazine edisylate], and Diphenhydramine hcl  No family history on file.  Social History Social History   Tobacco Use  . Smoking status: Never Smoker  . Smokeless tobacco: Never Used  Substance Use Topics  . Alcohol use: Never  . Drug use: Never    Review of Systems Constitutional: No fever/chills Eyes: No visual changes. ENT: No sore throat. Cardiovascular: Denies chest pain. Respiratory: Denies shortness of breath. Gastrointestinal: Occasional intermittent lower abdominal pain.  Currently reports not having any pain or discomfort, but will come and go off and on for at least the last several months. Genitourinary: Negative for dysuria.  Some cloudiness to urine the last several days. Musculoskeletal: Negative for back pain. Skin: Negative for rash. Neurological: Negative for headaches, areas of focal weakness or  numbness.    ____________________________________________   PHYSICAL EXAM:  VITAL SIGNS: ED Triage Vitals  Enc Vitals Group     BP 01/25/20 1115 (!) 181/81     Pulse Rate 01/25/20 1115 (!) 102     Resp 01/25/20 1115 16     Temp 01/25/20 1115 98.3 F (36.8 C)     Temp Source 01/25/20 1115 Oral     SpO2 01/25/20 1115 96 %     Weight 01/25/20 1116 160 lb (72.6 kg)     Height 01/25/20 1116 5\' 3"  (1.6 m)     Head Circumference --      Peak Flow --      Pain Score 01/25/20 1116 7     Pain Loc --      Pain Edu? --      Excl. in GC? --     Constitutional: Alert and oriented. Well appearing and in no acute distress. Eyes: Conjunctivae are normal. Head: Atraumatic. Nose: No congestion/rhinnorhea. Mouth/Throat: Mucous membranes are moist. Neck: No stridor.  Cardiovascular: Normal rate, regular rhythm. Grossly normal heart sounds.  Good peripheral circulation. Respiratory: Normal respiratory effort.  No retractions. Lungs CTAB. Gastrointestinal: Soft and nontender. No distention.  No pain or discomfort to palpation in any quadrant.  No rebound or guarding. Musculoskeletal: No lower extremity tenderness nor edema. Neurologic:  Normal speech and language. No gross focal neurologic deficits are appreciated.  Skin:  Skin is warm, dry and intact. No rash noted. Psychiatric: Mood and affect are normal. Speech and behavior are normal.  ____________________________________________   LABS (all labs ordered are listed, but only abnormal results are displayed)  Labs Reviewed  COMPREHENSIVE METABOLIC PANEL - Abnormal; Notable for the following components:      Result Value   Glucose, Bld 107 (*)    Creatinine, Ser 1.41 (*)    GFR calc non Af Amer 35 (*)    GFR calc Af Amer 40 (*)    All other components within normal limits  URINALYSIS, COMPLETE (UACMP) WITH MICROSCOPIC - Abnormal; Notable for the following components:   Color, Urine YELLOW (*)    APPearance CLOUDY (*)    Hgb urine  dipstick MODERATE (*)    Leukocytes,Ua LARGE (*)    WBC, UA >50 (*)    Bacteria, UA RARE (*)    All other components within normal limits  URINE CULTURE  LIPASE, BLOOD  CBC   ____________________________________________  EKG   ____________________________________________  RADIOLOGY  CT ABDOMEN PELVIS WO CONTRAST  Result Date: 01/25/2020 CLINICAL DATA:  Abdominal pain; history of diverticulitis, question of stool coming from vagina EXAM: CT ABDOMEN AND PELVIS WITHOUT CONTRAST TECHNIQUE: Multidetector CT imaging of the abdomen and pelvis was performed following the standard protocol without IV contrast. COMPARISON:  09/17/2019 FINDINGS: Lower chest: No acute abnormality. Hepatobiliary: No focal liver abnormality is seen. No gallstones, gallbladder wall thickening, or biliary dilatation. Pancreas: Unremarkable Spleen: Unremarkable. Adrenals/Urinary Tract: Stable left upper pole renal cyst. There is thickening of the posterosuperior bladder wall adjacent to sigmoid colon. Stomach/Bowel: Stomach is within normal limits. Bowel is normal in caliber. There is colonic diverticulosis with greatest involvement of the sigmoid. Wall thickening is present without adjacent inflammatory changes. This abuts presumed cervical remnant. Normal appendix. Vascular/Lymphatic: Aortic atherosclerosis. No enlarged abdominal or pelvic lymph nodes. Reproductive: Post hysterectomy.  See above Other: Abdominal wall is unremarkable. Musculoskeletal: No acute osseous abnormality. IMPRESSION: Sigmoid colonic diverticulosis with persistent wall thickening. This likely reflects chronic diverticulitis but as noted previously, an underlying lesion is not excluded and correlation with colonoscopy is recommended. This abuts the cervical remnant post hysterectomy and a fistula cannot be excluded given reported history. There is also thickening of the adjacent bladder wall without intraluminal air to suggest colovesical fistula.  Electronically Signed   By: Guadlupe Spanish M.D.   On: 01/25/2020 16:02    CT scan reviewed.  Discussed with Dr. Servando Snare, also discussed with Dr. Aleen Campi. ____________________________________________   PROCEDURES  Procedure(s) performed: None  Procedures  Critical Care performed: No  ____________________________________________   INITIAL IMPRESSION / ASSESSMENT AND PLAN / ED COURSE  Pertinent labs & imaging results that were available during my care of the patient were reviewed by me and considered in my medical decision making (see chart for details).   Differential diagnosis includes but is not limited to, abdominal perforation, aortic dissection, cholecystitis, appendicitis, diverticulitis, colitis, esophagitis/gastritis, kidney stone, pyelonephritis, urinary tract infection, aortic aneurysm. All are considered in decision and treatment plan. Based upon the patient's presentation and risk factors, given her previous history of diverticulitis and review of her GI notes as well as her history today there is concern  that she may have a potential fistula, also potentially urinary tract infection or maybe even a urinary bladder fistula   Clinical Course as of Jan 25 1748  Fri Jan 25, 2020  1626 CT discussed with Dr. Allen Norris. He recommends surgical consult. Dr. Allen Norris advises GI can not address concerns for the fistula, he does recommend she will need a future colonoscopy, but at this point recommends surgical and gyn follow-ups.    [MQ]  1639 Case discussed with Dr. Hampton Abbot.   [MQ]  1726 General surgery reviewed case including imaging studies.  Recommends 2-week treatment with Augmentin course and close follow-up with them.  They will call her Tuesday to schedule an appointment, also recommend discussion with OB/GYN to set up outpatient appointment   [MQ]  1729 Dr. Georgianne Fick advises outpatient follow-up with GYN, should be able to see her within 1 week.    [MQ]    Clinical Course User  Index [MQ] Delman Kitten, MD    ----------------------------------------- 5:53 PM on 01/25/2020 -----------------------------------------  Patient is nontoxic, well-appearing.  Has a known history of hypertension.  Noted to be somewhat hypertensive here but having no symptoms of hypertensive emergency.  Discussed with and patient will be setting up close follow-ups with general surgery as well as gynecology.  General surgery will call to schedule her appointment on Tuesday, patient understands to call both clinics.  Discussed careful return precautions, will treat as recommended by general surgery with 2-week course of amoxicillin.  Return precautions and treatment recommendations and follow-up discussed with the patient who is agreeable with the plan.  ____________________________________________   FINAL CLINICAL IMPRESSION(S) / ED DIAGNOSES  Final diagnoses:  Urinary tract infection, acute  Colonic fistula        Note:  This document was prepared using Dragon voice recognition software and may include unintentional dictation errors       Delman Kitten, MD 01/25/20 1754

## 2020-01-26 ENCOUNTER — Other Ambulatory Visit: Payer: Self-pay | Admitting: Surgery

## 2020-01-26 ENCOUNTER — Encounter: Payer: Self-pay | Admitting: Surgery

## 2020-01-26 MED ORDER — AMOXICILLIN-POT CLAVULANATE 875-125 MG PO TABS: 1.0000 | ORAL_TABLET | Freq: Two times a day (BID) | ORAL | 0 refills | Status: AC

## 2020-01-27 LAB — URINE CULTURE
Culture: NO GROWTH
Special Requests: NORMAL

## 2020-01-29 ENCOUNTER — Telehealth: Payer: Self-pay | Admitting: Emergency Medicine

## 2020-01-29 NOTE — Telephone Encounter (Signed)
Pt scheduled on 02/01/20 at 10:15am.

## 2020-01-29 NOTE — Telephone Encounter (Signed)
-----   Message from Kristy Dodge, MD sent at 01/25/2020  5:02 PM EDT ----- Regarding: ED referral Hi,  This patient came to the ED today.  Had a CT scan showing chronic diverticulitis, with possible colovaginal fistula.  Can y'all please schedule her for either Wednesday 6/2 or Friday 6/4 clinic?  I can start earlier at 8:30 if that works better.  Thanks!  [employer]

## 2020-02-01 ENCOUNTER — Ambulatory Visit (INDEPENDENT_AMBULATORY_CARE_PROVIDER_SITE_OTHER): Payer: Federal, State, Local not specified - PPO | Admitting: Surgery

## 2020-02-01 ENCOUNTER — Other Ambulatory Visit: Payer: Self-pay

## 2020-02-01 ENCOUNTER — Encounter: Payer: Self-pay | Admitting: Surgery

## 2020-02-01 VITALS — BP 179/73 | HR 99 | Temp 97.9°F | Resp 12 | Ht 63.0 in | Wt 159.4 lb

## 2020-02-01 DIAGNOSIS — N824 Other female intestinal-genital tract fistulae: Secondary | ICD-10-CM | POA: Diagnosis not present

## 2020-02-01 DIAGNOSIS — Z8719 Personal history of other diseases of the digestive system: Secondary | ICD-10-CM

## 2020-02-01 NOTE — Progress Notes (Signed)
02/01/2020  Reason for Visit:  Colovaginal fistula  History of Present Illness: Kristy Patterson is a 84 y.o. female who presented to the emergency room on 01/25/2020 due to concerns for stool coming out of her vagina.  She does have a history of diverticulitis and has been to the emergency room at [facility] in 08/03/2019.  She was admitted to the hospital with acute diverticulitis for a few days and was given antibiotic management.  She had a repeat CT scan on 09/17/2019 due to some persistent lower abdominal pain.  This repeat CT scan showed that there was improved mild inflammatory changes around the sigmoid colon with persistent wall thickening.  She was continued on antibiotics.  Initial recommendations were made for colonoscopy as an outpatient to evaluate the sigmoid colon better but she had declined this.  She was seen by Dr. Allen Norris on 12/31/2019 and discussed the rationale for getting a colonoscopy.  Patient says she will think about it and schedule the procedure in the future.  However she later contacted Dr. Allen Norris because of the stool coming from her vagina.  It was recommended that she get a CAT scan and she presented to emergency room for further evaluation.  Lab work in the emergency room showed a mildly elevated creatinine of 1.41 from a baseline of around 1.2, normal WBC of 7.4.  CT scan was done which showed sigmoid diverticulosis with persistent wall thickening of the sigmoid colon with potential colovaginal fistula although that determination was not made completely.  Past Medical History: Past Medical History:  Diagnosis Date  . Acquired hypothyroidism 10/06/2015  . Anemia   . Diverticulitis   . GERD (gastroesophageal reflux disease)   . Hypertension   . Hypertensive urgency 02/22/2016  . Irritable bowel syndrome (IBS)   . Precordial chest pain 02/23/2016  . Recurrent UTI 09/18/2015  . Renal disorder   . Sleep apnea   . Stage 3 chronic kidney disease   . Thyroid disease      Past  Surgical History: Past Surgical History:  Procedure Laterality Date  . ABDOMINAL HYSTERECTOMY  1971  . COLON SURGERY  2010   colorectal  . [place]  . PELVIC FLOOR REPAIR     anterior colporrhaphy, posterior colporraphy and colpoperineorrhaphy    Home Medications: Prior to Admission medications   Medication Sig Start Date End Date Taking? Authorizing Provider  acetaminophen (TYLENOL) 325 MG tablet Take 2 tablets (650 mg total) by mouth every 6 (six) hours as needed for mild pain, moderate pain, fever or headache (or Fever >/= 101). Patient taking differently: Take 650 mg by mouth as needed for mild pain, moderate pain, fever or headache (or Fever >/= 101).  08/07/19  Yes Roxan Hockey, MD  amoxicillin-clavulanate (AUGMENTIN) 875-125 MG tablet Take 1 tablet by mouth 2 (two) times daily for 14 days. 01/26/20 02/09/20 Yes Sherrie Marsan, Jacqulyn Bath, MD  cyanocobalamin 1000 MCG tablet Inject into the muscle.   Yes [provider]  diltiazem (DILACOR XR) 120 MG 24 hr capsule Take 1 capsule (120 mg total) by mouth daily. 08/07/19  Yes Emokpae, Courage, MD  levothyroxine (SYNTHROID) 100 MCG tablet TAKE 1 TABLET(100 MCG) BY MOUTH DAILY BEFORE BREAKFAST 10/30/19  Yes Fayrene Helper, MD  losartan (COZAAR) 50 MG tablet Take 1 tablet (50 mg total) by mouth 2 (two) times daily. For BP 08/07/19  Yes Emokpae, Courage, MD  polyethylene glycol (MIRALAX / GLYCOLAX) 17 g packet Take 17 g by mouth as needed.  Yes [provider]  saccharomyces boulardii (FLORASTOR) 250 MG capsule Take 1 capsule (250 mg total) by mouth 2 (two) times daily. 12/04/19  Yes Freddy Finner, NP  dicyclomine (BENTYL) 10 MG capsule Take 1 capsule (10 mg total) by mouth 3 (three) times daily as needed for up to 5 days for spasms. 01/25/20 01/30/20  Sharyn Creamer, MD    Allergies: Allergies  Allergen Reactions  . Atropine Swelling    Dried me out   . Contrast Media [Iodinated Diagnostic Agents]     States she's  not to have contrast because of kidneys  . Amlodipine Palpitations  . Compazine [Prochlorperazine Edisylate] Anxiety    Gets the shakes   . Diphenhydramine Hcl Nausea Only and Palpitations    Social History:  reports that she has never smoked. She has never used smokeless tobacco. She reports that she does not drink alcohol or use drugs.   Family History: Family History  Problem Relation Age of Onset  . Cervical cancer Mother   . Lung disease Father     Review of Systems: Review of Systems  Constitutional: Negative for chills and fever.  Respiratory: Negative for shortness of breath.   Cardiovascular: Negative for chest pain.  Gastrointestinal: Positive for abdominal pain (stool per vagina as well). Negative for constipation, diarrhea, nausea and vomiting.  Genitourinary: Negative for dysuria.  Musculoskeletal: Negative for myalgias.  Skin: Negative for rash.  Neurological: Negative for dizziness.  Psychiatric/Behavioral: Negative for depression.    Physical Exam BP (!) 179/73   Pulse 99   Temp 97.9 F (36.6 C)   Resp 12   Ht 5\' 3"  (1.6 m)   Wt 159 lb 6.4 oz (72.3 kg)   SpO2 96%   BMI 28.24 kg/m  CONSTITUTIONAL: No acute distress HEENT:  Normocephalic, atraumatic, extraocular motion intact. NECK: Trachea is midline, and there is no jugular venous distension.  RESPIRATORY:  Lungs are clear, and breath sounds are equal bilaterally. Normal respiratory effort without pathologic use of accessory muscles. CARDIOVASCULAR: Heart is regular without murmurs, gallops, or rubs. GI: The abdomen is soft, nondistended, currently nontender to palpation. GU/GYN:  Deferred MUSCULOSKELETAL:  Normal muscle strength and tone in all four extremities.  No peripheral edema or cyanosis. SKIN: Skin turgor is normal. There are no pathologic skin lesions.  NEUROLOGIC:  Motor and sensation is grossly normal.  Cranial nerves are grossly intact. PSYCH:  Alert and oriented to person, place and  time. Affect is normal.  Laboratory Analysis: Labs on 01/25/2020: Sodium 137, potassium 4.0, chloride 102, CO2 25, BUN 20, creatinine 1.41, LFTs within normal.  WBC 7.4, hemoglobin 12.5, hematocrit 39.4, platelets 180.  Imaging: CT scan abdomen/pelvis 01/17/2020: IMPRESSION: Sigmoid colonic diverticulosis with persistent wall thickening. This likely reflects chronic diverticulitis but as noted previously, an underlying lesion is not excluded and correlation with colonoscopy is recommended. This abuts the cervical remnant post hysterectomy and a fistula cannot be excluded given reported history. There is also thickening of the adjacent bladder wall without intraluminal air to suggest colovesical fistula.   Assessment and Plan: This is a 84 y.o. female with history of diverticulitis about 7 months ago with now a week with 10 days of stool per vagina.  -Discussed with the patient given her history and findings on her CT scan, there is concern for a colovaginal fistula.  This could have been as a result of the chronic inflammation from her diverticulitis and the portion of the sigmoid colon abutting the vaginal cuff from  her hysterectomy.  At this point, I discussed with patient that we need to better diagnose this area.  She will be seeing Dr. Bonney Aid with OB/GYN next week on 02/07/2020.  I am also sending a message to Dr. Servando Snare so that colonoscopy can be set up in the future for further diagnosis.  I discussed with the patient at this point we need to make sure this is only related to diverticulitis and not to any potential neoplasm as the management plan could potentially be different.  Down the line, once we have all the studies done, we will proceed towards surgery for sigmoidectomy and repair of the fistula. -Recommended that she continue taking the antibiotic prescribed as instructed. -I discussed with the patient that I will be out of town for the next 2 weeks I will set up a follow-up  appointment in about 3 weeks to check on her and keep up with any potential studies or appointments that she needs.  Face-to-face time spent with the patient and care providers was 45 minutes, with more than 50% of the time spent counseling, educating, and coordinating care of the patient.     Howie Ill, MD [facility]

## 2020-02-01 NOTE — Patient Instructions (Addendum)
Finish your course of antibiotics.   Keep your appointment with Dr Bonney Aid on 02/07/20. We will contact Dr Annabell Sabal office with [facility] so they can contact you to get you set up with a colonscopy. Their office will contact you to schedule an appointment. Please call the office if you do not hear from them.    Please see your appointment below. Call the office if you have any questions or concerns.

## 2020-02-04 ENCOUNTER — Telehealth: Payer: Self-pay | Admitting: Emergency Medicine

## 2020-02-04 ENCOUNTER — Telehealth: Payer: Self-pay

## 2020-02-04 NOTE — Telephone Encounter (Signed)
Dr. Aleen Campi office is calling because patient was seen on 02/01/2020 for History of diverticulitis. Patient was seen by Dr. Servando Snare on 12/31/2019 and denied a colonoscopy. Dr. Aleen Campi office wants [place] to call the patient to scheduled patient for a colonoscopy

## 2020-02-04 NOTE — Telephone Encounter (Signed)
Please review her note from Dr. Aleen Campi and let me know when an appropriate time would be to schedule her colonoscopy.

## 2020-02-04 NOTE — Telephone Encounter (Signed)
Whenever she wants to set up the colonoscopy.

## 2020-02-04 NOTE — Telephone Encounter (Signed)
Spoke to Dunn Loring letting her know Dr Adelene Idler recommendations about getting pt set up for a Colonoscopy with Dr Servando Snare. Per Morrie Sheldon she will send a message to Dr Annabell Sabal nurse.

## 2020-02-07 ENCOUNTER — Encounter: Payer: Self-pay | Admitting: Obstetrics and Gynecology

## 2020-02-07 ENCOUNTER — Ambulatory Visit (INDEPENDENT_AMBULATORY_CARE_PROVIDER_SITE_OTHER): Payer: Federal, State, Local not specified - PPO | Admitting: Obstetrics and Gynecology

## 2020-02-07 ENCOUNTER — Other Ambulatory Visit: Payer: Self-pay

## 2020-02-07 VITALS — BP 186/78 | HR 103 | Ht 63.0 in | Wt 159.0 lb

## 2020-02-07 DIAGNOSIS — N823 Fistula of vagina to large intestine: Secondary | ICD-10-CM | POA: Diagnosis not present

## 2020-02-07 NOTE — Progress Notes (Signed)
Obstetrics & Gynecology Office Visit   Chief Complaint:  Chief Complaint  Patient presents with  . ER follow up    History of Present Illness: 84 year old female presenting for ER follow up of possible rectovaginal fistula.  The patient has a long standing history of diverticulitis.  Over the past month she has noted several instances of feculent vaginal discharge and some light spotting.  This is not constant and is more apparent when she is having looser stools.  CT scan 01/25/2020 raised concern for possible cervico-rectal fistula. CT was non-contrast given patient's underlying renal disease. However, the patient reports history of vaginal hysterectomy in 1970's which would mean she no longer has a cervix.  Prior CT imaging 01/25/2020 noted sigmoid colon wall thickening and was unable to rule out a mass lesion.  CT 08/03/2019 revealed normal appearing vaginal cuff.    Review of Systems: Review of Systems  Constitutional: Negative.   Gastrointestinal: Positive for constipation and diarrhea. Negative for abdominal pain.  Genitourinary: Negative.   Skin: Negative.      Past Medical History:  Past Medical History:  Diagnosis Date  . Acquired hypothyroidism 10/06/2015  . Anemia   . Diverticulitis   . GERD (gastroesophageal reflux disease)   . Hypertension   . Hypertensive urgency 02/22/2016  . Irritable bowel syndrome (IBS)   . Precordial chest pain 02/23/2016  . Recurrent UTI 09/18/2015  . Renal disorder   . Sleep apnea   . Stage 3 chronic kidney disease   . Thyroid disease     Past Surgical History:  Past Surgical History:  Procedure Laterality Date  . ABDOMINAL HYSTERECTOMY  1971  . COLON SURGERY  2010   colorectal  . [place]  . PELVIC FLOOR REPAIR     anterior colporrhaphy, posterior colporraphy and colpoperineorrhaphy    Gynecologic History: No LMP recorded. Patient has had a hysterectomy.  Obstetric History: No obstetric history on  file.  Family History:  Family History  Problem Relation Age of Onset  . Cervical cancer Mother   . Lung disease Father     Social History:  Social History   Socioeconomic History  . Marital status: Widowed    Spouse name: Not on file  . Number of children: Not on file  . Years of education: Not on file  . Highest education level: Not on file  Occupational History  . Not on file  Tobacco Use  . Smoking status: Never Smoker  . Smokeless tobacco: Never Used  Substance and Sexual Activity  . Alcohol use: Never  . Drug use: Never  . Sexual activity: Not on file  Other Topics Concern  . Not on file  Social History Narrative   Pt recently moved to area    Social Determinants of Health   Financial Resource Strain: Low Risk   . Difficulty of Paying Living Expenses: Not hard at all  Food Insecurity: No Food Insecurity  . Worried About Charity fundraiser in the Last Year: Never true  . Ran Out of Food in the Last Year: Never true  Transportation Needs: No Transportation Needs  . Lack of Transportation (Medical): No  . Lack of Transportation (Non-Medical): No  Physical Activity: Inactive  . Days of Exercise per Week: 0 days  . Minutes of Exercise per Session: 0 min  Stress: No Stress Concern Present  . Feeling of Stress : Only a little  Social Connections: Moderately Integrated  . Frequency  of Communication with Friends and Family: More than three times a week  . Frequency of Social Gatherings with Friends and Family: More than three times a week  . Attends Religious Services: More than 4 times per year  . Active Member of Clubs or Organizations: Yes  . Attends Banker Meetings: More than 4 times per year  . Marital Status: Widowed  Intimate Partner Violence: Not At Risk  . Fear of Current or Ex-Partner: No  . Emotionally Abused: No  . Physically Abused: No  . Sexually Abused: No    Allergies:  Allergies  Allergen Reactions  . Atropine Swelling     Dried me out   . Contrast Media [Iodinated Diagnostic Agents]     States she's not to have contrast because of kidneys  . Amlodipine Palpitations  . Compazine [Prochlorperazine Edisylate] Anxiety    Gets the shakes   . Diphenhydramine Hcl Nausea Only and Palpitations    Medications: Prior to Admission medications   Medication Sig Start Date End Date Taking? Authorizing Provider  acetaminophen (TYLENOL) 325 MG tablet Take 2 tablets (650 mg total) by mouth every 6 (six) hours as needed for mild pain, moderate pain, fever or headache (or Fever >/= 101). Patient taking differently: Take 650 mg by mouth as needed for mild pain, moderate pain, fever or headache (or Fever >/= 101).  08/07/19  Yes Shon Hale, MD  amoxicillin-clavulanate (AUGMENTIN) 875-125 MG tablet Take 1 tablet by mouth 2 (two) times daily for 14 days. 01/26/20 02/09/20 Yes Piscoya, Elita Quick, MD  cyanocobalamin 1000 MCG tablet Inject into the muscle.   Yes [provider]  diltiazem (DILACOR XR) 120 MG 24 hr capsule Take 1 capsule (120 mg total) by mouth daily. 08/07/19  Yes Emokpae, Courage, MD  levothyroxine (SYNTHROID) 100 MCG tablet TAKE 1 TABLET(100 MCG) BY MOUTH DAILY BEFORE BREAKFAST 10/30/19  Yes Kerri Perches, MD  losartan (COZAAR) 50 MG tablet Take 1 tablet (50 mg total) by mouth 2 (two) times daily. For BP 08/07/19  Yes Emokpae, Courage, MD  polyethylene glycol (MIRALAX / GLYCOLAX) 17 g packet Take 17 g by mouth as needed.    Yes [provider]  saccharomyces boulardii (FLORASTOR) 250 MG capsule Take 1 capsule (250 mg total) by mouth 2 (two) times daily. 12/04/19  Yes Freddy Finner, NP  dicyclomine (BENTYL) 10 MG capsule Take 1 capsule (10 mg total) by mouth 3 (three) times daily as needed for up to 5 days for spasms. 01/25/20 01/30/20  Sharyn Creamer, MD    Physical Exam Vitals:  Vitals:   02/07/20 1347  BP: (!) 186/78  Pulse: (!) 103   No LMP recorded. Patient has had a hysterectomy.  General:  NAD, well nourished appears stated age HEENT: normocephalic, anicteric Pulmonary: No increased work of breathing Abdomen: Soft, non-tender, non-distended. No prior scar to suggest abdominal hysterectomy Umbilicus without lesions.  No hepatomegaly, splenomegaly or masses palpable. No evidence of hernia  Genitourinary:  External: Normal external female genitalia.  Normal urethral meatus, normal  Bartholin's and Skene's glands.    Vagina: Normal age appropriate atrophic vaginal mucosa, no evidence of prolapse.  At the top of the vaginal cuff there does appear to be a possible small invagination suggestive but not definitive of possible fistula.  No feculent material is appreciated on exam  Cervix: surgically absent  Uterus: surgically absent  Adnexa: ovaries non-enlarged, no adnexal masses  Rectal: deferred  Lymphatic: no evidence of inguinal lymphadenopathy Extremities: no edema,  erythema, or tenderness Neurologic: Grossly intact Psychiatric: mood appropriate, affect full  Female chaperone present for pelvic  portions of the physical exam  CT ABDOMEN PELVIS WO CONTRAST  Result Date: 01/25/2020 CLINICAL DATA:  Abdominal pain; history of diverticulitis, question of stool coming from vagina EXAM: CT ABDOMEN AND PELVIS WITHOUT CONTRAST TECHNIQUE: Multidetector CT imaging of the abdomen and pelvis was performed following the standard protocol without IV contrast. COMPARISON:  09/17/2019 FINDINGS: Lower chest: No acute abnormality. Hepatobiliary: No focal liver abnormality is seen. No gallstones, gallbladder wall thickening, or biliary dilatation. Pancreas: Unremarkable Spleen: Unremarkable. Adrenals/Urinary Tract: Stable left upper pole renal cyst. There is thickening of the posterosuperior bladder wall adjacent to sigmoid colon. Stomach/Bowel: Stomach is within normal limits. Bowel is normal in caliber. There is colonic diverticulosis with greatest involvement of the sigmoid. Wall thickening is present  without adjacent inflammatory changes. This abuts presumed cervical remnant. Normal appendix. Vascular/Lymphatic: Aortic atherosclerosis. No enlarged abdominal or pelvic lymph nodes. Reproductive: Post hysterectomy.  See above Other: Abdominal wall is unremarkable. Musculoskeletal: No acute osseous abnormality. IMPRESSION: Sigmoid colonic diverticulosis with persistent wall thickening. This likely reflects chronic diverticulitis but as noted previously, an underlying lesion is not excluded and correlation with colonoscopy is recommended. This abuts the cervical remnant post hysterectomy and a fistula cannot be excluded given reported history. There is also thickening of the adjacent bladder wall without intraluminal air to suggest colovesical fistula. Electronically Signed   By: Guadlupe Spanish M.D.   On: 01/25/2020 16:02     Assessment: 84 y.o. presenting for evaluation of possible rectovaginal fistula  Plan: Problem List Items Addressed This Visit    None     1) Possible rectovaginal fistula - spoke with Dr. Servando Snare regarding barium enema, tampon test instilling dilute methylene blue rectally to see if any evidence of methylene blue visible on tampon, or colonoscopy with instillation of saline solution vaginal to see if bubbles can be visualized vaginally with gaseous distention of colon. - given low cost, no additional imaging needed, and no need for anesthesia will proceed with methylene blue tampon test.  Will have office order and verify available prior to scheduling follow up - There is some concern that the area in question being called a cervix may be some sort of mass vs inflammatory reaction/tissue thickening from fistula. - If test is confirmatory of fistula will discuss surgical management with Dr. Aleen Campi   2) A total of were spent in face-to-face contact with the patient during this encounter with over half of that time devoted to counseling and coordination of care. CT imaging  findings were independently reviewed by myself  3) Return if symptoms worsen or fail to improve.    Vena Austria, MD, [facility], [facility] 02/07/2020, 9:08 PM

## 2020-02-12 NOTE — Telephone Encounter (Signed)
LVM for pt to return my call to schedule colonoscopy.  

## 2020-02-13 NOTE — Telephone Encounter (Signed)
Pt would like to wait until her follow up appt with Dr. Aleen Campi to schedule the colonoscopy. She is still taking antibiotics for the diverticulitis. She stated she also has a procedure coming up with Dr. Bonney Aid, GYN and would like to complete that first as well. She will call me once these appts are completed to schedule.

## 2020-02-14 ENCOUNTER — Telehealth: Payer: Self-pay | Admitting: Obstetrics and Gynecology

## 2020-02-14 NOTE — Telephone Encounter (Signed)
-----   Message from [place], LPN sent at 1/99/4129  4:42 PM EDT ----- Regarding: Appt scheduling This patient needs an apt w/AMS for Methylene Blue installation per AMS. It is here at the [place] office so preferrably at [facility], but I can transport to [place] if it is scheduled for [place]. Just let me know.

## 2020-02-14 NOTE — Telephone Encounter (Signed)
Patient is scheduled for 6/25/21at 11:30 at [address] location with AMS

## 2020-02-22 ENCOUNTER — Ambulatory Visit: Payer: Medicare Other | Admitting: Obstetrics and Gynecology

## 2020-02-22 NOTE — Telephone Encounter (Signed)
Patient is rescheduled due to time for appointment because of the procedure we are doing requires here to be in the office an hour after. Per AMS patient is schedule for or 9:10 on 03/27/20. Patient has additional questions and would like a returned call please advise

## 2020-02-25 ENCOUNTER — Other Ambulatory Visit: Payer: Self-pay

## 2020-02-25 ENCOUNTER — Encounter: Payer: Self-pay | Admitting: Surgery

## 2020-02-25 ENCOUNTER — Ambulatory Visit (INDEPENDENT_AMBULATORY_CARE_PROVIDER_SITE_OTHER): Payer: Federal, State, Local not specified - PPO | Admitting: Surgery

## 2020-02-25 VITALS — BP 202/84 | HR 114 | Temp 97.9°F | Resp 12 | Wt 161.0 lb

## 2020-02-25 DIAGNOSIS — Z8719 Personal history of other diseases of the digestive system: Secondary | ICD-10-CM

## 2020-02-25 DIAGNOSIS — N824 Other female intestinal-genital tract fistulae: Secondary | ICD-10-CM

## 2020-02-25 NOTE — Progress Notes (Signed)
02/25/2020  History of Present Illness: Kristy Patterson is a 84 y.o. female presenting for follow up of recent diagnosis of colovaginal fistula.  She was seen in the ED on 5/28 with stool per vagina and CT scan diagnosed colovaginal fistula.  There was also some thickening of the adjacent bladder wall, without true evidence of a fistula.  She was given an antibiotic course.  She has seen Dr. Georgianne Fick on 6/10 and on his exam, there was an area of possible small invagination at the top of the vaginal cuff which could be suggestive of a fistula.  She's scheduled for tomorrow to follow up with Dr. Georgianne Fick for a methylene blue tampon test.  Today, she reports that she still has some debris coming from vagina, but that has improved compared to when the antibiotic had been started, when she was having more diarrhea.  She's having some more constipation at this point instead.  Denies any worsening abdominal pain, but feels some discomfort with her constipation.  Denies any fevers, chills, chest pain, shortness of breath.  She denies any debris or gas in her urine but does report having some cloudy urine.  Past Medical History: Past Medical History:  Diagnosis Date  . Acquired hypothyroidism 10/06/2015  . Anemia   . Diverticulitis   . GERD (gastroesophageal reflux disease)   . Hypertension   . Hypertensive urgency 02/22/2016  . Irritable bowel syndrome (IBS)   . Precordial chest pain 02/23/2016  . Recurrent UTI 09/18/2015  . Renal disorder   . Sleep apnea   . Stage 3 chronic kidney disease   . Thyroid disease      Past Surgical History: Past Surgical History:  Procedure Laterality Date  . ABDOMINAL HYSTERECTOMY  1971  . COLON SURGERY  2010   colorectal  . [place]  . PELVIC FLOOR REPAIR     anterior colporrhaphy, posterior colporraphy and colpoperineorrhaphy    Home Medications: Prior to Admission medications   Medication Sig Start Date End Date Taking? Authorizing Provider   acetaminophen (TYLENOL) 325 MG tablet Take 2 tablets (650 mg total) by mouth every 6 (six) hours as needed for mild pain, moderate pain, fever or headache (or Fever >/= 101). Patient taking differently: Take 650 mg by mouth as needed for mild pain, moderate pain, fever or headache (or Fever >/= 101).  08/07/19  Yes Emokpae, Courage, MD  cyanocobalamin 1000 MCG tablet Inject into the muscle.   Yes [provider]  diltiazem (DILACOR XR) 120 MG 24 hr capsule Take 1 capsule (120 mg total) by mouth daily. 08/07/19  Yes Emokpae, Courage, MD  levothyroxine (SYNTHROID) 100 MCG tablet TAKE 1 TABLET(100 MCG) BY MOUTH DAILY BEFORE BREAKFAST 10/30/19  Yes Fayrene Helper, MD  losartan (COZAAR) 50 MG tablet Take 1 tablet (50 mg total) by mouth 2 (two) times daily. For BP 08/07/19  Yes Emokpae, Courage, MD  polyethylene glycol (MIRALAX / GLYCOLAX) 17 g packet Take 17 g by mouth as needed.    Yes [provider]  ranitidine (ZANTAC) 150 MG capsule Take by mouth. 02/05/20 02/04/21 Yes [provider]  saccharomyces boulardii (FLORASTOR) 250 MG capsule Take 1 capsule (250 mg total) by mouth 2 (two) times daily. 12/04/19  Yes Perlie Mayo, NP  telmisartan (MICARDIS) 40 MG tablet Take by mouth. 02/05/20 02/04/21 Yes [provider]  dicyclomine (BENTYL) 10 MG capsule Take 1 capsule (10 mg total) by mouth 3 (three) times daily as needed for up to 5  days for spasms. 01/25/20 01/30/20  Sharyn Creamer, MD    Allergies: Allergies  Allergen Reactions  . Atropine Swelling    Dried me out   . Amlodipine Palpitations  . Compazine [Prochlorperazine Edisylate] Anxiety    Gets the shakes   . Diphenhydramine Hcl Nausea Only and Palpitations  . Iodinated Diagnostic Agents Palpitations    States she's not to have contrast because of kidneys Other reaction(s): gi distress  . Spironolactone Nausea Only    Also caused lip numbness and constipation    Review of Systems: Review of Systems   Constitutional: Negative for chills and fever.  Respiratory: Negative for shortness of breath.   Cardiovascular: Negative for chest pain.  Gastrointestinal: Positive for abdominal pain and constipation. Negative for diarrhea, nausea and vomiting.  Genitourinary: Negative for dysuria.    Physical Exam BP (!) 202/84   Pulse (!) 114   Temp 97.9 F (36.6 C)   Resp 12   Wt 161 lb (73 kg)   SpO2 96%   BMI 28.52 kg/m  CONSTITUTIONAL: No acute distress HEENT:  Normocephalic, atraumatic, extraocular motion intact. RESPIRATORY:  Lungs are clear, and breath sounds are equal bilaterally. Normal respiratory effort without pathologic use of accessory muscles. CARDIOVASCULAR: Heart is regular without murmurs, gallops, or rubs. GI: The abdomen is soft, non-distended, with some mild discomfort in pelvis, but no particular tenderness. NEUROLOGIC:  Motor and sensation is grossly normal.  Cranial nerves are grossly intact. PSYCH:  Alert and oriented to person, place and time. Affect is normal.   Assessment and Plan: This is a 84 y.o. female with colovaginal fistula.  --Discussed with the patient more at length regarding the planned test tomorrow with the methylene blue.  It would serve as a two-fold test in that as long as the methylene blue reaches high enough in the sigmoid colon, we'd be able to see blue dye in the tampon if she has a colovaginal fistula, and we would see change in color in her urine if she has a colovesical fistula as well.  Discussed with her that a negative test is not necessarily exclusive if the dye did not reach into the distal sigmoid colon, where the fistula is located. --Discussed with her also the need for a colonoscopy in order to rule out a neoplasm as a cause of her fistula.  Based on timing, I think towards the end of July to early August would be appropriate for colonoscopy.  Would defer to Dr. Servando Snare to determine best timing and if any further imaging is needed.  Patient  will call Dr. Annabell Sabal office to set up appointment. --Patient will follow up with me after colonoscopy to discuss the findings and plans for surgical intervention.  For now, discussed with her different scenarios based on how much infection or inflammation is present, ranging from resection and colostomy, to colorectal anastomosis with diverting loop ileostomy, to primary anastomosis alone.  Will discuss more details after colonoscopy.   Face-to-face time spent with the patient and care providers was 25 minutes, with more than 50% of the time spent counseling, educating, and coordinating care of the patient.     Howie Ill, MD [facility]

## 2020-02-25 NOTE — Patient Instructions (Addendum)
Keep your appointment with Dr Bonney Aid tomorrow 02/25/20.  Please contact G[facility] ([facility]) at [phone] to get your Colonoscopy scheduled. Towards end of July-early August.    Once you have your colonoscopy scheduled, please call the office so we can make a follow up appointment scheduled for you to see Dr Aleen Campi.   Please call the office if you have any questions or concerns.

## 2020-02-26 ENCOUNTER — Ambulatory Visit (INDEPENDENT_AMBULATORY_CARE_PROVIDER_SITE_OTHER): Payer: Federal, State, Local not specified - PPO | Admitting: Obstetrics and Gynecology

## 2020-02-26 ENCOUNTER — Encounter: Payer: Self-pay | Admitting: Obstetrics and Gynecology

## 2020-02-26 VITALS — BP 142/72 | Ht 63.0 in | Wt 159.0 lb

## 2020-02-26 DIAGNOSIS — N823 Fistula of vagina to large intestine: Secondary | ICD-10-CM

## 2020-02-26 NOTE — Progress Notes (Signed)
° °  Methylene Blue Tampon Test    The indications for study were reviewed.   CT imaging had previously demonstrated what appeared to be a fistulous tract involving what was read as the cervix and sigmoid colon.  The patient is status post prior vaginal hysterectomy and does not have a cervix on exam.  No evidence of fistula was appreciated on exam and she has not had any further leakage of feculent material vaginally since her ER visit.  Alternative diagnostic approaches has been discussed with the patient in order to document rectovaginal fistula including barium enema with delayed imaging, instilling saline solution at the time of colonoscopy to evalutae for bubbles, or instillation of saline/mehylene blue enema with tampon.  Mutual decision was to proceed with Methylene blue enema and tampon for relative ease of use and ability to preform in the outpatient setting.  The patient states she understands and agrees to undergo procedure today. Time out was performed. A foley catheter inserted about 10cm and of NS with methelyne blue was instilled rectally.  Tampon placed vaginally.  The patient was able to hold the enema for 15 minutes prior to having to evacuate.  The tampon was removed 45 minutes following instilation.  The bottom of tampon remained unstained.  However, the top of tampon showed evidence of methylene blue dye thereby confirming fistulous communications.  A graves speculum was paced.  There was a small pinpoint of blue noted at the left upper vaginal apex.   Given CT reads previously read as possible mass, and now as cervix without presence of cervix would proceed with colonoscopy.  I discussed with Dr Aleen Campi that I had not previously not any fistula repairs.  However, if he is comfortable with assisting and bringing down an omental flap the vaginal cuff should be over sown with one or two throws of sutures given how small the fistula is.  I also discussed referral to a urogynecologist as   [facility] as another option  Vena Austria, MD, [facility], [facility] 03/06/2020, 5:25 PM

## 2020-02-27 ENCOUNTER — Ambulatory Visit: Payer: Medicare Other | Admitting: Surgery

## 2020-02-28 ENCOUNTER — Other Ambulatory Visit: Payer: Self-pay | Admitting: *Deleted

## 2020-02-28 ENCOUNTER — Telehealth: Payer: Self-pay

## 2020-02-28 MED ORDER — DILTIAZEM HCL ER 120 MG PO CP24: 120.0000 mg | ORAL_CAPSULE | Freq: Every day | ORAL | 3 refills | Status: DC

## 2020-02-28 NOTE — Telephone Encounter (Signed)
MEds resent for pt

## 2020-02-28 NOTE — Telephone Encounter (Signed)
Please send Meds to [facility] in [place] , on [address] Diltiazem

## 2020-03-05 ENCOUNTER — Other Ambulatory Visit: Payer: Self-pay

## 2020-03-05 MED ORDER — CIPROFLOXACIN HCL 500 MG PO TABS
500.0000 mg | ORAL_TABLET | Freq: Two times a day (BID) | ORAL | 0 refills | Status: DC
Start: 2020-03-05 — End: 2022-01-08

## 2020-03-05 MED ORDER — METRONIDAZOLE 500 MG PO TABS
500.0000 mg | ORAL_TABLET | Freq: Three times a day (TID) | ORAL | 0 refills | Status: DC
Start: 2020-03-05 — End: 2021-12-31

## 2020-03-10 ENCOUNTER — Other Ambulatory Visit: Payer: Self-pay | Admitting: Obstetrics and Gynecology

## 2020-03-10 DIAGNOSIS — N823 Fistula of vagina to large intestine: Secondary | ICD-10-CM | POA: Insufficient documentation

## 2020-03-12 ENCOUNTER — Ambulatory Visit: Payer: Federal, State, Local not specified - PPO | Admitting: Internal Medicine

## 2020-03-21 ENCOUNTER — Other Ambulatory Visit: Payer: Self-pay

## 2020-03-21 ENCOUNTER — Other Ambulatory Visit: Payer: Self-pay | Admitting: Surgery

## 2020-03-21 ENCOUNTER — Telehealth: Payer: Self-pay

## 2020-03-21 DIAGNOSIS — Z8719 Personal history of other diseases of the digestive system: Secondary | ICD-10-CM

## 2020-03-21 NOTE — Telephone Encounter (Signed)
Pt has been scheduled with ID.

## 2020-03-21 NOTE — Telephone Encounter (Signed)
-----   Message from Midge Minium, MD sent at 03/18/2020  3:44 PM EDT ----- There are all the recommended options from the literature:   Ciprofloxacin (500 mg every 12 hours) plus metronidazole (500 mg every 8 hours)  Levofloxacin (750 mg daily) plus metronidazole (500 mg every 8 hours) Trimethoprim-sulfamethoxazole (1 double-strength tablet [sulfamethoxazole 800 mg; trimethoprim 160 mg] every 12 hours) plus metronidazole (500 mg every 8 hours)  Amoxicillin-clavulanate (1 tablet [875 mg amoxicillin; 125 mg clavulanic acid] every 8 hours) [12,18,19] or Augmentin XR (2 tablets [each tablet containing 1 g amoxicillin; 62.5 mg clavulanic acid] every 12 hours) Moxifloxacin (400 mg daily; use in patients intolerant of both metronidazole and beta-lactam agents)  As you can see, they all have one or the other medication she can not take. She may need to be set up with ID for any further recommendations. ----- Message ----- From: Rayann Heman, CMA Sent: 03/18/2020   3:27 PM EDT To: Midge Minium, MD  Pt was prescribed Flagyl and Cipro for a diverticulitis flare. Pt had to stop the Flagyl because he was making her extremely dizzy. She can't take it. The Augmentin gave her diarrhea. Is there anything else you can prescribe to help? She is feeling a little better right now but she is worried the flare will return even worse.   Please advise.

## 2020-03-25 ENCOUNTER — Other Ambulatory Visit: Payer: Self-pay

## 2020-03-25 MED ORDER — AMOXICILLIN-POT CLAVULANATE 600-42.9 MG/5ML PO SUSR: ORAL | 0 refills | Status: DC

## 2020-03-28 ENCOUNTER — Ambulatory Visit: Payer: Federal, State, Local not specified - PPO | Admitting: Family Medicine

## 2020-04-01 ENCOUNTER — Ambulatory Visit: Admit: 2020-04-01 | Payer: Federal, State, Local not specified - PPO | Admitting: Gastroenterology

## 2020-04-01 ENCOUNTER — Ambulatory Visit: Payer: Federal, State, Local not specified - PPO | Admitting: Infectious Diseases

## 2020-04-01 SURGERY — COLONOSCOPY WITH PROPOFOL
Anesthesia: General

## 2020-04-07 ENCOUNTER — Ambulatory Visit: Payer: Medicare Other | Admitting: Surgery

## 2020-04-24 ENCOUNTER — Ambulatory Visit: Payer: Federal, State, Local not specified - PPO | Admitting: Family Medicine

## 2020-05-12 ENCOUNTER — Encounter: Payer: Self-pay | Admitting: *Deleted

## 2020-08-13 ENCOUNTER — Other Ambulatory Visit: Payer: Self-pay

## 2020-08-13 MED ORDER — DILTIAZEM HCL ER 120 MG PO CP24: 120.0000 mg | ORAL_CAPSULE | Freq: Every day | ORAL | 3 refills | Status: DC

## 2020-11-21 ENCOUNTER — Other Ambulatory Visit: Payer: Self-pay | Admitting: Family Medicine

## 2020-11-26 ENCOUNTER — Other Ambulatory Visit: Payer: Self-pay | Admitting: Family Medicine

## 2021-06-03 ENCOUNTER — Emergency Department
Admission: EM | Admit: 2021-06-03 | Discharge: 2021-06-03 | Disposition: A | Discharge status: Home / Self Care | Payer: Federal, State, Local not specified - PPO | Attending: Emergency Medicine | Admitting: Emergency Medicine

## 2021-06-03 ENCOUNTER — Encounter: Payer: Self-pay | Admitting: Emergency Medicine

## 2021-06-03 ENCOUNTER — Emergency Department: Payer: Federal, State, Local not specified - PPO

## 2021-06-03 ENCOUNTER — Other Ambulatory Visit: Payer: Self-pay

## 2021-06-03 DIAGNOSIS — R0602 Shortness of breath: Secondary | ICD-10-CM | POA: Insufficient documentation

## 2021-06-03 DIAGNOSIS — I129 Hypertensive chronic kidney disease with stage 1 through stage 4 chronic kidney disease, or unspecified chronic kidney disease: Secondary | ICD-10-CM | POA: Insufficient documentation

## 2021-06-03 DIAGNOSIS — E039 Hypothyroidism, unspecified: Secondary | ICD-10-CM | POA: Diagnosis not present

## 2021-06-03 DIAGNOSIS — R0789 Other chest pain: Secondary | ICD-10-CM | POA: Diagnosis not present

## 2021-06-03 DIAGNOSIS — R000 Tachycardia, unspecified: Secondary | ICD-10-CM | POA: Diagnosis not present

## 2021-06-03 DIAGNOSIS — R079 Chest pain, unspecified: Secondary | ICD-10-CM

## 2021-06-03 DIAGNOSIS — I10 Essential (primary) hypertension: Secondary | ICD-10-CM

## 2021-06-03 DIAGNOSIS — Z20822 Contact with and (suspected) exposure to covid-19: Secondary | ICD-10-CM | POA: Insufficient documentation

## 2021-06-03 DIAGNOSIS — N183 Chronic kidney disease, stage 3 unspecified: Secondary | ICD-10-CM | POA: Diagnosis not present

## 2021-06-03 DIAGNOSIS — R911 Solitary pulmonary nodule: Secondary | ICD-10-CM

## 2021-06-03 DIAGNOSIS — R1012 Left upper quadrant pain: Secondary | ICD-10-CM | POA: Diagnosis not present

## 2021-06-03 DIAGNOSIS — R059 Cough, unspecified: Secondary | ICD-10-CM | POA: Diagnosis not present

## 2021-06-03 LAB — HEPATIC FUNCTION PANEL
ALT: 12 U/L (ref 0–44)
AST: 23 U/L (ref 15–41)
Albumin: 4.1 g/dL (ref 3.5–5.0)
Alkaline Phosphatase: 91 U/L (ref 38–126)
Bilirubin, Direct: <0.1 mg/dL (ref 0.0–0.2)
Total Bilirubin: 0.9 mg/dL (ref 0.3–1.2)
Total Protein: 7.4 g/dL (ref 6.5–8.1)

## 2021-06-03 LAB — RESP PANEL BY RT-PCR (FLU A&B, COVID) ARPGX2
Influenza A by PCR: NEGATIVE
Influenza B by PCR: NEGATIVE
SARS Coronavirus 2 by RT PCR: NEGATIVE

## 2021-06-03 LAB — CBC
HCT: 40.8 % (ref 36.0–46.0)
Hemoglobin: 13.7 g/dL (ref 12.0–15.0)
MCH: 29.8 pg (ref 26.0–34.0)
MCHC: 33.6 g/dL (ref 30.0–36.0)
MCV: 88.7 fL (ref 80.0–100.0)
Platelets: 226 10*3/uL (ref 150–400)
RBC: 4.6 MIL/uL (ref 3.87–5.11)
RDW: 14.6 % (ref 11.5–15.5)
WBC: 6.2 10*3/uL (ref 4.0–10.5)
nRBC: 0 % (ref 0.0–0.2)

## 2021-06-03 LAB — BASIC METABOLIC PANEL
Anion gap: 10 (ref 5–15)
BUN: 18 mg/dL (ref 8–23)
CO2: 25 mmol/L (ref 22–32)
Calcium: 9.5 mg/dL (ref 8.9–10.3)
Chloride: 100 mmol/L (ref 98–111)
Creatinine, Ser: 1.3 mg/dL — ABNORMAL HIGH (ref 0.44–1.00)
GFR, Estimated: 41 mL/min — ABNORMAL LOW (ref >60)
Glucose, Bld: 124 mg/dL — ABNORMAL HIGH (ref 70–99)
Potassium: 3.4 mmol/L — ABNORMAL LOW (ref 3.5–5.1)
Sodium: 135 mmol/L (ref 135–145)

## 2021-06-03 LAB — LIPASE, BLOOD: Lipase: 40 U/L (ref 11–51)

## 2021-06-03 LAB — TROPONIN I (HIGH SENSITIVITY)
Troponin I (High Sensitivity): 7 ng/L (ref <18)
Troponin I (High Sensitivity): 12 ng/L (ref <18)

## 2021-06-03 MED ORDER — LACTATED RINGERS IV BOLUS
1000.0000 mL | Freq: Once | INTRAVENOUS | Status: AC
  Administered 2021-06-03: 1000 mL via INTRAVENOUS

## 2021-06-03 MED ORDER — IOHEXOL 350 MG/ML SOLN
60.0000 mL | Freq: Once | INTRAVENOUS | Status: AC | PRN
  Administered 2021-06-03: 60 mL via INTRAVENOUS
  Filled 2021-06-03: qty 60

## 2021-06-03 NOTE — ED Notes (Signed)
Patient stable and discharged with all personal belongings and AVS. AVS and discharge instructions reviewed with patient and opportunity for questions provided.   

## 2021-06-03 NOTE — ED Triage Notes (Signed)
Arrives from [facility] for evaluation of HTN.  Patient had presented to [facility] for c/o cough, chest tightness with cough and LUQ abdominal pain x 1 week.  Patient has history of HTN  AAOx3.  Skin warm and dry.  No SOB/ DOE. NAD

## 2021-06-03 NOTE — ED Provider Notes (Signed)
[facility] Emergency Department Provider Note   ____________________________________________   Event Date/Time   First MD Initiated Contact with Patient 06/03/21 1127     (approximate)  I have reviewed the triage vital signs and the nursing notes.   HISTORY  Chief Complaint Hypertension    HPI Kristy Patterson is a 85 y.o. female with past medical history of hypertension, hyperlipidemia, CKD, and GERD who presents to the ED complaining of chest pain and shortness of breath.  Patient reports that she has had about a week and a half of intermittent tightness in her chest associated with difficulty breathing.  She has been dealing with a nonproductive cough, but denies any fevers.  She additionally states she has been having pain in the left upper quadrant of her abdomen for about the past week, denies associated nausea or vomiting but does endorse constipation.  She has not had any dysuria or flank pain.  She was initially evaluated at the walk-in clinic but referred to the ED for further assessment due to chest pain and elevated blood pressure.        Past Medical History:  Diagnosis Date   Acquired hypothyroidism 10/06/2015   Anemia    Diverticulitis    GERD (gastroesophageal reflux disease)    Hypertension    Hypertensive urgency 02/22/2016   Irritable bowel syndrome (IBS)    Precordial chest pain 02/23/2016   Recurrent UTI 09/18/2015   Renal disorder    Sleep apnea    Stage 3 chronic kidney disease (HCC)    Thyroid disease     Patient Active Problem List   Diagnosis Date Noted   Rectovaginal fistula 03/10/2020   Intestinal bacterial overgrowth 12/04/2019   History of diverticulitis 08/28/2019   History of constipation 08/28/2019   Elevated TSH 08/25/2019   Hypothyroidism 08/25/2019   Vitamin D deficiency 08/25/2019   Irritable bowel syndrome with constipation 08/25/2019   Diverticulitis 08/25/2019   Constipation 08/21/2019   Acute diverticulitis  of intestine 08/03/2019   CKD (chronic kidney disease), stage III (HCC) 02/23/2016   Diverticulosis 02/23/2016   Dizziness 02/23/2016   Generalized abdominal pain 02/23/2016   GERD (gastroesophageal reflux disease) 09/18/2015   B12 deficiency 09/18/2015   Essential hypertension 09/18/2015   Hypothyroidism due to acquired atrophy of thyroid 12/03/2014   Cervical stenosis of spinal canal 01/06/2014   Low back pain 03/23/2013   Generalized anxiety disorder 03/06/2013   Vaginal prolapse 03/14/2012   Hyperlipidemia 04/03/2008   B12 deficiency anemia 07/23/2007    Past Surgical History:  Procedure Laterality Date   ABDOMINAL HYSTERECTOMY  1971   COLON SURGERY  2010   colorectal   HEMORRHOID SURGERY  1967   PELVIC FLOOR REPAIR     anterior colporrhaphy, posterior colporraphy and colpoperineorrhaphy    Prior to Admission medications   Medication Sig Start Date End Date Taking? Authorizing Provider  acetaminophen (TYLENOL) 325 MG tablet Take 2 tablets (650 mg total) by mouth every 6 (six) hours as needed for mild pain, moderate pain, fever or headache (or Fever >/= 101). Patient taking differently: Take 650 mg by mouth as needed for mild pain, moderate pain, fever or headache (or Fever >/= 101).  08/07/19   Shon Hale, MD  amoxicillin-clavulanate (AUGMENTIN) 600-42.9 MG/5ML suspension Take 7.31mL twice daily x 14 days 03/25/20   Midge Minium, MD  amoxicillin-clavulanate (AUGMENTIN) 875-125 MG tablet Take 1 tablet by mouth 2 (two) times daily. 03/22/20   [provider]  ciprofloxacin (CIPRO) 500 MG  tablet Take 1 tablet (500 mg total) by mouth 2 (two) times daily. 03/05/20   Midge Minium, MD  cyanocobalamin 1000 MCG tablet Inject into the muscle.    [provider]  diltiazem (DILACOR XR) 120 MG 24 hr capsule Take 1 capsule (120 mg total) by mouth daily. 08/13/20   Freddy Finner, NP  levothyroxine (SYNTHROID) 100 MCG tablet TAKE 1 TABLET BY MOUTH ONCE DAILY BEFORE  BREAKFAST 11/26/20   Kerri Perches, MD  losartan (COZAAR) 50 MG tablet Take 1 tablet (50 mg total) by mouth 2 (two) times daily. For BP 08/07/19   Emokpae, Courage, MD  metroNIDAZOLE (FLAGYL) 500 MG tablet Take 1 tablet (500 mg total) by mouth 3 (three) times daily. 03/05/20   Midge Minium, MD  polyethylene glycol (MIRALAX / GLYCOLAX) 17 g packet Take 17 g by mouth as needed.     [provider]  saccharomyces boulardii (FLORASTOR) 250 MG capsule Take 1 capsule (250 mg total) by mouth 2 (two) times daily. Patient not taking: Reported on 02/26/2020 12/04/19   Freddy Finner, NP  telmisartan (MICARDIS) 40 MG tablet Take by mouth. Patient not taking: Reported on 02/26/2020 02/05/20 02/04/21  [provider]    Allergies Atropine, Amlodipine, Compazine [prochlorperazine edisylate], Diphenhydramine hcl, and Spironolactone  Family History  Problem Relation Age of Onset   Cervical cancer Mother    Lung disease Father     Social History Social History   Tobacco Use   Smoking status: Never   Smokeless tobacco: Never  Substance Use Topics   Alcohol use: Never   Drug use: Never    Review of Systems  Constitutional: No fever/chills Eyes: No visual changes. ENT: No sore throat. Cardiovascular: Positive for chest pain. Respiratory: Positive for cough and shortness of breath. Gastrointestinal: Positive for abdominal pain.  No nausea, no vomiting.  No diarrhea.  Positive for constipation. Genitourinary: Negative for dysuria. Musculoskeletal: Negative for back pain. Skin: Negative for rash. Neurological: Negative for headaches, focal weakness or numbness.  ____________________________________________   PHYSICAL EXAM:  VITAL SIGNS: ED Triage Vitals  Enc Vitals Group     BP 06/03/21 1100 (!) 208/88     Pulse Rate 06/03/21 1100 (!) 136     Resp 06/03/21 1100 18     Temp 06/03/21 1100 98.6 F (37 C)     Temp Source 06/03/21 1100 Oral     SpO2 06/03/21 1100 99 %      Weight 06/03/21 1047 158 lb 15.2 oz (72.1 kg)     Height 06/03/21 1047 5\' 3"  (1.6 m)     Head Circumference --      Peak Flow --      Pain Score 06/03/21 1047 0     Pain Loc --      Pain Edu? --      Excl. in GC? --     Constitutional: Alert and oriented. Eyes: Conjunctivae are normal. Head: Atraumatic. Nose: No congestion/rhinnorhea. Mouth/Throat: Mucous membranes are moist. Neck: Normal ROM Cardiovascular: Tachycardic, regular rhythm. Grossly normal heart sounds.  2+ radial pulses bilaterally. Respiratory: Normal respiratory effort.  No retractions. Lungs CTAB. Gastrointestinal: Soft and tender to palpation in the left upper quadrant with no rebound or guarding. No distention. Genitourinary: deferred Musculoskeletal: No lower extremity tenderness nor edema. Neurologic:  Normal speech and language. No gross focal neurologic deficits are appreciated. Skin:  Skin is warm, dry and intact. No rash noted. Psychiatric: Mood and affect are normal. Speech and behavior are  normal.  ____________________________________________   LABS (all labs ordered are listed, but only abnormal results are displayed)  Labs Reviewed  BASIC METABOLIC PANEL - Abnormal; Notable for the following components:      Result Value   Potassium 3.4 (*)    Glucose, Bld 124 (*)    Creatinine, Ser 1.30 (*)    GFR, Estimated 41 (*)    All other components within normal limits  RESP PANEL BY RT-PCR (FLU A&B, COVID) ARPGX2  CBC  HEPATIC FUNCTION PANEL  LIPASE, BLOOD  TROPONIN I (HIGH SENSITIVITY)  TROPONIN I (HIGH SENSITIVITY)   ____________________________________________  EKG  ED ECG REPORT I, Chesley Noon, the attending physician, personally viewed and interpreted this ECG.   Date: 06/03/2021  EKG Time: 11:02  Rate: 120  Rhythm: sinus tachycardia  Axis: LAD  Intervals:right bundle branch block  ST&T Change: None   PROCEDURES  Procedure(s) performed (including Critical  Care):  Procedures   ____________________________________________   INITIAL IMPRESSION / ASSESSMENT AND PLAN / ED COURSE      85 year old female with past medical history of hypertension, hyperlipidemia, CKD, and GERD who presents to the ED with a about a week and a half of chest pain described as a tightness associated with cough, shortness of breath, and left upper quadrant abdominal pain.  Patient noted to be tachycardic and hypertensive, EKG shows sinus tachycardia with right bundle blanch block but no ischemic changes.  Initial troponin is negative and we will trend, low suspicion for ACS but with her tachycardia we will check for PE with CTA.  We will also check CT of her abdomen/pelvis given left upper quadrant tenderness.  Renal function is stable from previous, patient has contrast allergy listed but states this is only because she has been told not to get contrast due to her renal function.  She denies any true allergic symptoms.  Her renal function is in acceptable range to receive IV contrast, benefits outweigh risks given need to rule out life-threatening etiologies of her symptoms.  Patient agrees with plan to proceed with IV contrast, no indication for pretreatment.  Patient tolerated IV contrast without any symptoms of allergic reaction.  CTA is negative for PE or other acute process, does show pulmonary nodule that patient may follow-up with her PCP for.  2 sets of troponin are negative and I doubt ACS.  CT of abdomen/pelvis is also unremarkable.  Patient is appropriate for discharge home with PCP follow-up for recheck of her blood pressure, was counseled to return to the ED for new worsening symptoms.  Patient agrees with plan.      ____________________________________________   FINAL CLINICAL IMPRESSION(S) / ED DIAGNOSES  Final diagnoses:  Primary hypertension  Chest pain, unspecified type  Left upper quadrant abdominal pain     ED Discharge Orders     None         Note:  This document was prepared using Dragon voice recognition software and may include unintentional dictation errors.    Chesley Noon, MD 06/03/21 [phone]

## 2021-12-24 ENCOUNTER — Other Ambulatory Visit: Payer: Self-pay | Admitting: Gastroenterology

## 2021-12-24 DIAGNOSIS — R1010 Upper abdominal pain, unspecified: Secondary | ICD-10-CM

## 2021-12-30 ENCOUNTER — Encounter
Admission: EM | Disposition: A | Discharge status: Other Acute Inpatient Hospital | Payer: Self-pay | Source: Home / Self Care | Attending: Emergency Medicine

## 2021-12-30 ENCOUNTER — Other Ambulatory Visit: Payer: Self-pay

## 2021-12-30 ENCOUNTER — Ambulatory Visit
Admission: EM | Admit: 2021-12-30 | Discharge: 2021-12-31 | Disposition: A | Discharge status: Other Acute Inpatient Hospital | Payer: Federal, State, Local not specified - PPO | Attending: Emergency Medicine | Admitting: Emergency Medicine

## 2021-12-30 DIAGNOSIS — Z7902 Long term (current) use of antithrombotics/antiplatelets: Secondary | ICD-10-CM | POA: Diagnosis not present

## 2021-12-30 DIAGNOSIS — I129 Hypertensive chronic kidney disease with stage 1 through stage 4 chronic kidney disease, or unspecified chronic kidney disease: Secondary | ICD-10-CM | POA: Diagnosis not present

## 2021-12-30 DIAGNOSIS — Z7982 Long term (current) use of aspirin: Secondary | ICD-10-CM | POA: Insufficient documentation

## 2021-12-30 DIAGNOSIS — I350 Nonrheumatic aortic (valve) stenosis: Secondary | ICD-10-CM | POA: Diagnosis not present

## 2021-12-30 DIAGNOSIS — N183 Chronic kidney disease, stage 3 unspecified: Secondary | ICD-10-CM | POA: Diagnosis not present

## 2021-12-30 DIAGNOSIS — I2121 ST elevation (STEMI) myocardial infarction involving left circumflex coronary artery: Secondary | ICD-10-CM | POA: Insufficient documentation

## 2021-12-30 DIAGNOSIS — Z955 Presence of coronary angioplasty implant and graft: Secondary | ICD-10-CM | POA: Insufficient documentation

## 2021-12-30 DIAGNOSIS — E039 Hypothyroidism, unspecified: Secondary | ICD-10-CM | POA: Diagnosis not present

## 2021-12-30 DIAGNOSIS — I2510 Atherosclerotic heart disease of native coronary artery without angina pectoris: Secondary | ICD-10-CM | POA: Diagnosis not present

## 2021-12-30 DIAGNOSIS — G4733 Obstructive sleep apnea (adult) (pediatric): Secondary | ICD-10-CM | POA: Insufficient documentation

## 2021-12-30 DIAGNOSIS — I213 ST elevation (STEMI) myocardial infarction of unspecified site: Secondary | ICD-10-CM

## 2021-12-30 DIAGNOSIS — R079 Chest pain, unspecified: Secondary | ICD-10-CM | POA: Diagnosis present

## 2021-12-30 HISTORY — PX: IABP INSERTION: CATH118242

## 2021-12-30 HISTORY — PX: LEFT HEART CATH AND CORONARY ANGIOGRAPHY: CATH118249

## 2021-12-30 HISTORY — PX: CORONARY/GRAFT ACUTE MI REVASCULARIZATION: CATH118305

## 2021-12-30 LAB — CBC WITH DIFFERENTIAL/PLATELET
Abs Immature Granulocytes: 0.06 10*3/uL (ref 0.00–0.07)
Basophils Absolute: 0 10*3/uL (ref 0.0–0.1)
Basophils Relative: 0 %
Eosinophils Absolute: 0.1 10*3/uL (ref 0.0–0.5)
Eosinophils Relative: 0 %
HCT: 40.4 % (ref 36.0–46.0)
Hemoglobin: 13.1 g/dL (ref 12.0–15.0)
Immature Granulocytes: 1 %
Lymphocytes Relative: 13 %
Lymphs Abs: 1.5 10*3/uL (ref 0.7–4.0)
MCH: 28.1 pg (ref 26.0–34.0)
MCHC: 32.4 g/dL (ref 30.0–36.0)
MCV: 86.7 fL (ref 80.0–100.0)
Monocytes Absolute: 0.9 10*3/uL (ref 0.1–1.0)
Monocytes Relative: 7 %
Neutro Abs: 9.6 10*3/uL — ABNORMAL HIGH (ref 1.7–7.7)
Neutrophils Relative %: 79 %
Platelets: 270 10*3/uL (ref 150–400)
RBC: 4.66 MIL/uL (ref 3.87–5.11)
RDW: 13.8 % (ref 11.5–15.5)
WBC: 12.2 10*3/uL — ABNORMAL HIGH (ref 4.0–10.5)
nRBC: 0 % (ref 0.0–0.2)

## 2021-12-30 LAB — COMPREHENSIVE METABOLIC PANEL
ALT: 18 U/L (ref 0–44)
AST: 57 U/L — ABNORMAL HIGH (ref 15–41)
Albumin: 3.7 g/dL (ref 3.5–5.0)
Alkaline Phosphatase: 104 U/L (ref 38–126)
Anion gap: 12 (ref 5–15)
BUN: 18 mg/dL (ref 8–23)
CO2: 23 mmol/L (ref 22–32)
Calcium: 8.7 mg/dL — ABNORMAL LOW (ref 8.9–10.3)
Chloride: 94 mmol/L — ABNORMAL LOW (ref 98–111)
Creatinine, Ser: 1.28 mg/dL — ABNORMAL HIGH (ref 0.44–1.00)
GFR, Estimated: 41 mL/min — ABNORMAL LOW (ref >60)
Glucose, Bld: 178 mg/dL — ABNORMAL HIGH (ref 70–99)
Potassium: 3.3 mmol/L — ABNORMAL LOW (ref 3.5–5.1)
Sodium: 129 mmol/L — ABNORMAL LOW (ref 135–145)
Total Bilirubin: 0.5 mg/dL (ref 0.3–1.2)
Total Protein: 7.3 g/dL (ref 6.5–8.1)

## 2021-12-30 LAB — PROTIME-INR
INR: 0.9 (ref 0.8–1.2)
Prothrombin Time: 12.4 seconds (ref 11.4–15.2)

## 2021-12-30 LAB — TROPONIN I (HIGH SENSITIVITY): Troponin I (High Sensitivity): 4097 ng/L (ref <18)

## 2021-12-30 SURGERY — CORONARY/GRAFT ACUTE MI REVASCULARIZATION
Anesthesia: Moderate Sedation

## 2021-12-30 MED ORDER — VERAPAMIL HCL 2.5 MG/ML IV SOLN
INTRAVENOUS | Status: AC
  Filled 2021-12-30: qty 2

## 2021-12-30 MED ORDER — HEPARIN (PORCINE) IN NACL 1000-0.9 UT/500ML-% IV SOLN
INTRAVENOUS | Status: DC | PRN
Start: 2021-12-30 — End: 2021-12-31
  Administered 2021-12-30 – 2021-12-31 (×3): 500 mL

## 2021-12-30 MED ORDER — NITROGLYCERIN 2 % TD OINT
1.0000 [in_us] | TOPICAL_OINTMENT | Freq: Once | TRANSDERMAL | Status: AC
  Administered 2021-12-30: 1 [in_us] via TOPICAL

## 2021-12-30 MED ORDER — HEPARIN (PORCINE) IN NACL 1000-0.9 UT/500ML-% IV SOLN
INTRAVENOUS | Status: AC
  Filled 2021-12-30: qty 1000

## 2021-12-30 MED ORDER — MIDAZOLAM HCL 2 MG/2ML IJ SOLN
INTRAMUSCULAR | Status: DC | PRN
  Administered 2021-12-30: 1 mg via INTRAVENOUS

## 2021-12-30 MED ORDER — LIDOCAINE HCL (PF) 1 % IJ SOLN
INTRAMUSCULAR | Status: DC | PRN
  Administered 2021-12-30: 2 mL

## 2021-12-30 MED ORDER — FENTANYL CITRATE (PF) 100 MCG/2ML IJ SOLN
INTRAMUSCULAR | Status: AC
  Filled 2021-12-30: qty 2

## 2021-12-30 MED ORDER — SODIUM CHLORIDE 0.9 % IV BOLUS
1000.0000 mL | Freq: Once | INTRAVENOUS | Status: AC
  Administered 2021-12-30: 1000 mL via INTRAVENOUS

## 2021-12-30 MED ORDER — MIDAZOLAM HCL 2 MG/2ML IJ SOLN
INTRAMUSCULAR | Status: AC
  Filled 2021-12-30: qty 2

## 2021-12-30 MED ORDER — VERAPAMIL HCL 2.5 MG/ML IV SOLN
INTRAVENOUS | Status: DC | PRN
  Administered 2021-12-30: 2.5 mg via INTRAVENOUS

## 2021-12-30 MED ORDER — LIDOCAINE HCL 1 % IJ SOLN
INTRAMUSCULAR | Status: AC
  Filled 2021-12-30: qty 20

## 2021-12-30 MED ORDER — HEPARIN SODIUM (PORCINE) 1000 UNIT/ML IJ SOLN
INTRAMUSCULAR | Status: AC
  Filled 2021-12-30: qty 10

## 2021-12-30 MED ORDER — CLOPIDOGREL BISULFATE 75 MG PO TABS
600.0000 mg | ORAL_TABLET | Freq: Once | ORAL | Status: AC
  Administered 2021-12-30: 600 mg via ORAL

## 2021-12-30 MED ORDER — ASPIRIN 325 MG PO TABS
325.0000 mg | ORAL_TABLET | Freq: Every day | ORAL | Status: DC
  Administered 2021-12-30: 325 mg via ORAL

## 2021-12-30 MED ORDER — FENTANYL CITRATE (PF) 100 MCG/2ML IJ SOLN
INTRAMUSCULAR | Status: DC | PRN
  Administered 2021-12-30: 25 ug via INTRAVENOUS

## 2021-12-30 MED ORDER — HEPARIN SODIUM (PORCINE) 1000 UNIT/ML IJ SOLN
4000.0000 [IU] | Freq: Once | INTRAMUSCULAR | Status: AC
  Administered 2021-12-30: 4000 [IU] via INTRAVENOUS

## 2021-12-30 MED ORDER — HEPARIN SODIUM (PORCINE) 1000 UNIT/ML IJ SOLN
INTRAMUSCULAR | Status: DC | PRN
  Administered 2021-12-30: 4000 [IU] via INTRAVENOUS
  Administered 2021-12-30: 2000 [IU] via INTRAVENOUS
  Administered 2021-12-30: 3000 [IU] via INTRAVENOUS

## 2021-12-30 SURGICAL SUPPLY — 23 items
BALLN TREK RX 2.25X12 (BALLOONS) ×2
BALLN ~~LOC~~ TREK RX 3.0X12 (BALLOONS) ×2
BALLOON TREK RX 2.25X12 (BALLOONS) IMPLANT
BALLOON ~~LOC~~ TREK RX 3.0X12 (BALLOONS) IMPLANT
CANNULA 5F STIFF (CANNULA) ×1 IMPLANT
CATH 5FR JR4 DIAGNOSTIC (CATHETERS) ×1 IMPLANT
CATH IAB 7FR 40ML (CATHETERS) ×1 IMPLANT
CATH INFINITI 5 FR JL3.5 (CATHETERS) ×1 IMPLANT
CATH LAUNCHER 6FR EBU 3 (CATHETERS) ×1 IMPLANT
COVER PROBE U/S 5X48 (MISCELLANEOUS) ×1 IMPLANT
DEVICE RAD TR BAND REGULAR (VASCULAR PRODUCTS) ×1 IMPLANT
DRAPE BRACHIAL (DRAPES) ×1 IMPLANT
GLIDESHEATH SLEND SS 6F .021 (SHEATH) ×1 IMPLANT
GUIDEWIRE INQWIRE 1.5J.035X260 (WIRE) IMPLANT
INQWIRE 1.5J .035X260CM (WIRE) ×2
KIT ENCORE 26 ADVANTAGE (KITS) ×1 IMPLANT
PACK CARDIAC CATH (CUSTOM PROCEDURE TRAY) ×2 IMPLANT
SET ATX SIMPLICITY (MISCELLANEOUS) ×1 IMPLANT
STENT ONYX FRONTIER 2.75X22 (Permanent Stent) ×1 IMPLANT
TUBING CIL FLEX 10 FLL-RA (TUBING) ×1 IMPLANT
VALVE COPILOT STAT (MISCELLANEOUS) ×1 IMPLANT
WIRE HITORQ VERSACORE ST 145CM (WIRE) ×1 IMPLANT
WIRE RUNTHROUGH .014X180CM (WIRE) ×1 IMPLANT

## 2021-12-30 NOTE — ED Notes (Signed)
Pt belongings pants, shirt, 2 socks , 2 shoes, underwear and watch placed in belongings bag. Pt also had 2 pink bags.  ?

## 2021-12-30 NOTE — Consult Note (Signed)
[facility] Cardiology ? ?CARDIOLOGY CONSULT NOTE  ?Patient ID: ?Kristy Patterson ?MRN: [id number] ?DOB/AGE: 84-Jan-1937 84 y.o. ? ?Admit date: 12/30/2021 ?Referring Physician Conni Slipper ?Primary Physician Leonel Ramsay, MD ?Primary Cardiologist None ?Reason for Consultation STEMI ? ?HPI:  ?Kristy Patterson is a 86 yo F with h/o HTN, CKD3, OSA, hypothyroidism who presents with chest pain. EKG shows inferior ST elevation.  ? ?She states taht for the last 3-4 days she has been experiencing intermittent pain in her epigastric area that she thought was GI related. She did not vomit, but was scheduled for a CT scan later this week for further evaluation. Today the pain became more severe for the last 4 hours and was associated with nausea prompting her to call EMS. EKG in the field showed sinus tachycardia, RBBB, and inferior ST elevation. Vital signs are HR ~ 110, BP 160/70, O2 100%.  ? ?Review of systems complete and found to be negative unless listed above  ? ? ? ?Past Medical History:  ?Diagnosis Date  ? Acquired hypothyroidism 10/06/2015  ? Anemia   ? Diverticulitis   ? GERD (gastroesophageal reflux disease)   ? Hypertension   ? Hypertensive urgency 02/22/2016  ? Irritable bowel syndrome (IBS)   ? Precordial chest pain 02/23/2016  ? Recurrent UTI 09/18/2015  ? Renal disorder   ? Sleep apnea   ? Stage 3 chronic kidney disease ([place])   ? Thyroid disease   ?  ?Past Surgical History:  ?Procedure Laterality Date  ? ABDOMINAL HYSTERECTOMY  1971  ? COLON SURGERY  2010  ? colorectal  ? [place]  ? PELVIC FLOOR REPAIR    ? anterior colporrhaphy, posterior colporraphy and colpoperineorrhaphy  ?  ?(Not in a hospital admission) ? ?Social History  ? ?Socioeconomic History  ? Marital status: Widowed  ?  Spouse name: Not on file  ? Number of children: Not on file  ? Years of education: Not on file  ? Highest education level: Not on file  ?Occupational History  ? Not on file  ?Tobacco Use  ? Smoking status: Never  ? Smokeless tobacco: Never   ?Substance and Sexual Activity  ? Alcohol use: Never  ? Drug use: Never  ? Sexual activity: Not on file  ?Other Topics Concern  ? Not on file  ?Social History Narrative  ? Pt recently moved to area   ? ?Social Determinants of Health  ? ?Financial Resource Strain: Not on file  ?Food Insecurity: Not on file  ?Transportation Needs: Not on file  ?Physical Activity: Not on file  ?Stress: Not on file  ?Social Connections: Not on file  ?Intimate Partner Violence: Not on file  ?  ?Family History  ?Problem Relation Age of Onset  ? Cervical cancer Mother   ? Lung disease Father   ?  ? ? ?Review of systems complete and found to be negative unless listed above  ? ? ? ? ?PHYSICAL EXAM ? ?General: Well developed, well nourished, in no acute distress ?HEENT:  Normocephalic and atramatic ?Neck:  No JVD.  ?Lungs: Clear bilaterally to auscultation and percussion. ?Heart: HRRR . Normal S1 and S2 without gallops or murmurs.  ?Abdomen: Bowel sounds are positive, abdomen soft and non-tender  ?Msk:  Back normal, normal gait. Normal strength and tone for age. ?Extremities: No clubbing, cyanosis or edema.   ?Neuro: Alert and oriented X 3. ?Psych:  Good affect, responds appropriately ? ?Labs: ?  ?Lab Results  ?Component Value Date  ? WBC 6.2  06/03/2021  ? HGB 13.7 06/03/2021  ? HCT 40.8 06/03/2021  ? MCV 88.7 06/03/2021  ? PLT 226 06/03/2021  ? No results for input(s): NA, K, CL, CO2, Patterson, CREATININE, CALCIUM, PROT, BILITOT, ALKPHOS, ALT, AST, GLUCOSE in the last 168 hours. ? ?Invalid input(s): LABALBU ?No results found for: CKTOTAL, CKMB, CKMBINDEX, TROPONINI No results found for: CHOL ?No results found for: HDL ?No results found for: [place] ?No results found for: TRIG ?No results found for: CHOLHDL ?No results found for: LDLDIRECT  ?  ?Radiology: No results found. ? ?EKG: Sinus tachycardia. RBBB. Inferior ST elevation ? ?ASSESSMENT AND PLAN:  ? ?# STEMI- d/t occlusion of mid Lcx s/p DES ?# 3 Vessel CAD ?Patient was admitted with an  inferior STEMI with culprit likely being the mid left circumflex.  1 drug-eluting stent was placed.  She has severe residual coronary disease including a easy 80% distal LMCA stenosis, and ostial LAD stenosis of 95%, mid LAD stenosis of 80%, and a proximal RCA stenosis of 70%.  Following PCI to her LCx, she was having ongoing chest pain so a balloon pump was placed and she was transferred emergently to [facility].  She was in stable condition with residual chest pain mostly resolved. ?-Transfer accepted by Dr. Ellyn Hack with plans for PCI to the LMCA in the next day or 2 ?-Right femoral IABP placed.  We will need to initiate heparin on arrival to [facility].  Currently set at 1:1 ?-Continue aspirin 81 mg indefinitely ?-S/p Plavix 600 mg.  Continue Plavix 75 mg daily ? ? ?Signed: ?Andrez Grime MD ?12/30/2021, 10:16 PM ? ?

## 2021-12-30 NOTE — ED Triage Notes (Signed)
Pt has been having chest pain and abd pain for 3 days. Pt had nausea today. With EMS pt had elevation in leads 2, 3, and avf. Pt is alert and oriented.  ?

## 2021-12-30 NOTE — ED Provider Notes (Signed)
? ?[facility] ?Provider Note ? ? ? Event Date/Time  ? First MD Initiated Contact with Patient 12/30/21 2222   ?  (approximate) ? ? ?History  ? ?Chest Pain ? ? ?HPI ? ?Kristy Patterson is a 86 y.o. female patient with 3 days of waxing and waning pain in the epigastrium and along the underside of the ribs.  Patient thought that this was from her transverse colon.  She did call EMS today and EMS transmitted an EKG that looked there there were ST elevation inferiorly with reciprocal changes consistent with MI.  Patient arrived here in the emergency department EKG was repeated confirming this.  Labs were ordered aspirin was ordered although patient asked not to have aspirin given to her anyway 1 dose I think will not influence her GFR and should help her heart.  We gave her Plavix initially 300 and then a second 300.  Heparin bolus was given 1 inch Nitropaste was placed patient's blood pressure was elevated pain really did not do anything much so we gave her second Intropaste.  Dr. Corky Sox is here supervising the patient's care.  Cath Lab is still assembling at staff.----------------------------------------- ?10:25 PM on 12/30/2021 ?----------------------------------------- ?Above dictation was completed at this time. ? ?  ? ? ?Physical Exam  ? ?Triage Vital Signs: ?ED Triage Vitals  ?Enc Vitals Group  ?   BP 12/30/21 2202 (!) 159/81  ?   Pulse Rate 12/30/21 2202 (!) 112  ?   Resp 12/30/21 2202 (!) 21  ?   Temp 12/30/21 2202 97.8 ?F (36.6 ?C)  ?   Temp Source 12/30/21 2202 Oral  ?   SpO2 12/30/21 2155 99 %  ?   Weight 12/30/21 2203 170 lb (77.1 kg)  ?   Height 12/30/21 2203 5\' 3"  (1.6 m)  ?   Head Circumference --   ?   Peak Flow --   ?   Pain Score 12/30/21 2201 10  ?   Pain Loc --   ?   Pain Edu? --   ?   Excl. in [place]? --   ? ? ?Most recent vital signs: ?Vitals:  ? 12/30/21 2155 12/30/21 2202  ?BP:  (!) 159/81  ?Pulse:  (!) 112  ?Resp:  (!) 21  ?Temp:  97.8 ?F (36.6 ?C)  ?SpO2: 99% 99%   ? ? ?General: Awake, alert but uncomfortable ?CV:  Good peripheral perfusion.  Heart regular rate and rhythm no audible murmurs there is a third heart sound present ?Resp:  Normal effort.  ?Abd:  No distention.  Abdomen soft nontender ?Extremities no significant edema ? ? ?ED Results / Procedures / Treatments  ? ?Labs ?(all labs ordered are listed, but only abnormal results are displayed) ?Labs Reviewed  ?CBC WITH DIFFERENTIAL/PLATELET - Abnormal; Notable for the following components:  ?    Result Value  ? WBC 12.2 (*)   ? Neutro Abs 9.6 (*)   ? All other components within normal limits  ?PROTIME-INR  ?COMPREHENSIVE METABOLIC PANEL  ?TROPONIN I (HIGH SENSITIVITY)  ? ? ? ?EKG ? ?EKG read interpreted by me shows sinus tachycardia at a rate of 117 left axis with ST elevation inferiorly in lead V5 and V6.  There are reciprocal changes. ? ? ?RADIOLOGY ? ? ? ?PROCEDURES: ? ?Critical Care performed: Critical care time 15 minutes.  This involves supervising the patient reading the EKG giving the initial round of medications and then discussing the patient with the cardiologist ? ?Procedures ? ? ?  MEDICATIONS ORDERED IN ED: ?Medications  ?aspirin tablet 325 mg ( Oral Automatically Held 01/08/22 1000)  ?clopidogrel (PLAVIX) tablet 600 mg (600 mg Oral Given 12/30/21 2207)  ?heparin sodium (porcine) injection 4,000 Units (4,000 Units Intravenous Given 12/30/21 2210)  ?nitroGLYCERIN (NITROGLYN) 2 % ointment 1 inch (1 inch Topical Given 12/30/21 2203)  ?nitroGLYCERIN (NITROGLYN) 2 % ointment 1 inch (1 inch Topical Given 12/30/21 2215)  ?sodium chloride 0.9 % bolus 1,000 mL (1,000 mLs Intravenous New Bag/Given 12/30/21 2212)  ? ? ? ?IMPRESSION / MDM / ASSESSMENT AND PLAN / ED COURSE  ?I reviewed the triage vital signs and the nursing notes. ? ? ? ?The patient is on the cardiac monitor to evaluate for evidence of arrhythmia and/or significant heart rate changes.  Sinus tachycardia at 1 17-1 21 ? ?  ? ? ?FINAL CLINICAL IMPRESSION(S) / ED  DIAGNOSES  ? ?Final diagnoses:  ?ST elevation myocardial infarction (STEMI), unspecified artery ([place])  ? ? ? ?Rx / DC Orders  ? ?ED Discharge Orders   ? ? None  ? ?  ? ? ? ?Note:  This document was prepared using Dragon voice recognition software and may include unintentional dictation errors. ?  ?Nena Polio, MD ?12/30/21 2232 ? ?

## 2021-12-30 NOTE — ED Notes (Addendum)
Pt daughter called and given update by Darnelle Catalan MD ?

## 2021-12-30 NOTE — ED Notes (Addendum)
Pt transferred to cath lab by this RN and [facility]. Pt on monitor and [place]. Pt alert and oriented  ? ?

## 2021-12-30 NOTE — Progress Notes (Signed)
?  Chaplain On-Call responded to Code STEMI notification at 2149 hours to the ED, room 1. ? ?Medical team was providing urgent assessment and care in preparation to transport the patient to the Cath Lab for a procedure. ? ?No family is present. ?Staff will contact Chaplain if additional support is requested. ? ?Chaplain Evelena Peat ?M.Div., [facility] ?

## 2021-12-30 NOTE — ED Notes (Signed)
Pt in route to Cardiac Cath at this time. ?

## 2021-12-31 ENCOUNTER — Inpatient Hospital Stay (HOSPITAL_COMMUNITY)
Admission: EM | Admit: 2021-12-31 | Discharge: 2022-01-08 | DRG: 246 | Disposition: A | Discharge status: Home Health | Payer: Medicare Other | Source: Other Acute Inpatient Hospital | Attending: Cardiology | Admitting: Cardiology

## 2021-12-31 ENCOUNTER — Inpatient Hospital Stay (HOSPITAL_COMMUNITY): Payer: Medicare Other

## 2021-12-31 ENCOUNTER — Encounter (HOSPITAL_COMMUNITY): Payer: Self-pay | Admitting: Student

## 2021-12-31 DIAGNOSIS — K5730 Diverticulosis of large intestine without perforation or abscess without bleeding: Secondary | ICD-10-CM | POA: Diagnosis present

## 2021-12-31 DIAGNOSIS — Z90710 Acquired absence of both cervix and uterus: Secondary | ICD-10-CM | POA: Diagnosis not present

## 2021-12-31 DIAGNOSIS — E785 Hyperlipidemia, unspecified: Secondary | ICD-10-CM | POA: Diagnosis present

## 2021-12-31 DIAGNOSIS — I130 Hypertensive heart and chronic kidney disease with heart failure and stage 1 through stage 4 chronic kidney disease, or unspecified chronic kidney disease: Secondary | ICD-10-CM | POA: Diagnosis present

## 2021-12-31 DIAGNOSIS — I2119 ST elevation (STEMI) myocardial infarction involving other coronary artery of inferior wall: Secondary | ICD-10-CM

## 2021-12-31 DIAGNOSIS — I2510 Atherosclerotic heart disease of native coronary artery without angina pectoris: Secondary | ICD-10-CM | POA: Diagnosis not present

## 2021-12-31 DIAGNOSIS — N183 Chronic kidney disease, stage 3 unspecified: Secondary | ICD-10-CM

## 2021-12-31 DIAGNOSIS — I4510 Unspecified right bundle-branch block: Secondary | ICD-10-CM | POA: Diagnosis present

## 2021-12-31 DIAGNOSIS — E871 Hypo-osmolality and hyponatremia: Secondary | ICD-10-CM | POA: Diagnosis present

## 2021-12-31 DIAGNOSIS — E669 Obesity, unspecified: Secondary | ICD-10-CM | POA: Diagnosis present

## 2021-12-31 DIAGNOSIS — N1330 Unspecified hydronephrosis: Secondary | ICD-10-CM | POA: Diagnosis not present

## 2021-12-31 DIAGNOSIS — I5031 Acute diastolic (congestive) heart failure: Secondary | ICD-10-CM | POA: Diagnosis not present

## 2021-12-31 DIAGNOSIS — I214 Non-ST elevation (NSTEMI) myocardial infarction: Secondary | ICD-10-CM

## 2021-12-31 DIAGNOSIS — I25118 Atherosclerotic heart disease of native coronary artery with other forms of angina pectoris: Secondary | ICD-10-CM | POA: Diagnosis not present

## 2021-12-31 DIAGNOSIS — G4733 Obstructive sleep apnea (adult) (pediatric): Secondary | ICD-10-CM | POA: Diagnosis present

## 2021-12-31 DIAGNOSIS — I2121 ST elevation (STEMI) myocardial infarction involving left circumflex coronary artery: Secondary | ICD-10-CM | POA: Diagnosis not present

## 2021-12-31 DIAGNOSIS — E039 Hypothyroidism, unspecified: Secondary | ICD-10-CM | POA: Diagnosis not present

## 2021-12-31 DIAGNOSIS — N823 Fistula of vagina to large intestine: Secondary | ICD-10-CM | POA: Diagnosis present

## 2021-12-31 DIAGNOSIS — Z8049 Family history of malignant neoplasm of other genital organs: Secondary | ICD-10-CM | POA: Diagnosis not present

## 2021-12-31 DIAGNOSIS — Z4509 Encounter for adjustment and management of other cardiac device: Secondary | ICD-10-CM

## 2021-12-31 DIAGNOSIS — K625 Hemorrhage of anus and rectum: Secondary | ICD-10-CM | POA: Diagnosis not present

## 2021-12-31 DIAGNOSIS — I10 Essential (primary) hypertension: Secondary | ICD-10-CM | POA: Diagnosis present

## 2021-12-31 DIAGNOSIS — R339 Retention of urine, unspecified: Secondary | ICD-10-CM

## 2021-12-31 DIAGNOSIS — I213 ST elevation (STEMI) myocardial infarction of unspecified site: Secondary | ICD-10-CM | POA: Diagnosis not present

## 2021-12-31 DIAGNOSIS — G8929 Other chronic pain: Secondary | ICD-10-CM | POA: Diagnosis present

## 2021-12-31 DIAGNOSIS — K219 Gastro-esophageal reflux disease without esophagitis: Secondary | ICD-10-CM | POA: Diagnosis present

## 2021-12-31 DIAGNOSIS — Z955 Presence of coronary angioplasty implant and graft: Secondary | ICD-10-CM | POA: Diagnosis not present

## 2021-12-31 DIAGNOSIS — K5909 Other constipation: Secondary | ICD-10-CM | POA: Diagnosis not present

## 2021-12-31 DIAGNOSIS — I2109 ST elevation (STEMI) myocardial infarction involving other coronary artery of anterior wall: Secondary | ICD-10-CM | POA: Diagnosis not present

## 2021-12-31 DIAGNOSIS — N3289 Other specified disorders of bladder: Secondary | ICD-10-CM

## 2021-12-31 DIAGNOSIS — K581 Irritable bowel syndrome with constipation: Secondary | ICD-10-CM | POA: Diagnosis present

## 2021-12-31 DIAGNOSIS — Z79899 Other long term (current) drug therapy: Secondary | ICD-10-CM | POA: Diagnosis not present

## 2021-12-31 DIAGNOSIS — I25110 Atherosclerotic heart disease of native coronary artery with unstable angina pectoris: Secondary | ICD-10-CM

## 2021-12-31 DIAGNOSIS — N1831 Chronic kidney disease, stage 3a: Secondary | ICD-10-CM | POA: Diagnosis not present

## 2021-12-31 DIAGNOSIS — Z8719 Personal history of other diseases of the digestive system: Secondary | ICD-10-CM | POA: Diagnosis not present

## 2021-12-31 DIAGNOSIS — N179 Acute kidney failure, unspecified: Secondary | ICD-10-CM | POA: Diagnosis present

## 2021-12-31 DIAGNOSIS — R1030 Lower abdominal pain, unspecified: Secondary | ICD-10-CM | POA: Diagnosis not present

## 2021-12-31 DIAGNOSIS — N1832 Chronic kidney disease, stage 3b: Secondary | ICD-10-CM | POA: Diagnosis not present

## 2021-12-31 DIAGNOSIS — Z6832 Body mass index (BMI) 32.0-32.9, adult: Secondary | ICD-10-CM

## 2021-12-31 DIAGNOSIS — Z79890 Hormone replacement therapy (postmenopausal): Secondary | ICD-10-CM

## 2021-12-31 DIAGNOSIS — I5032 Chronic diastolic (congestive) heart failure: Secondary | ICD-10-CM

## 2021-12-31 LAB — BASIC METABOLIC PANEL
Anion gap: 11 (ref 5–15)
BUN: 15 mg/dL (ref 8–23)
CO2: 23 mmol/L (ref 22–32)
Calcium: 8.8 mg/dL — ABNORMAL LOW (ref 8.9–10.3)
Chloride: 96 mmol/L — ABNORMAL LOW (ref 98–111)
Creatinine, Ser: 1.28 mg/dL — ABNORMAL HIGH (ref 0.44–1.00)
GFR, Estimated: 41 mL/min — ABNORMAL LOW (ref >60)
Glucose, Bld: 121 mg/dL — ABNORMAL HIGH (ref 70–99)
Potassium: 4.1 mmol/L (ref 3.5–5.1)
Sodium: 130 mmol/L — ABNORMAL LOW (ref 135–145)

## 2021-12-31 LAB — ECHOCARDIOGRAM COMPLETE
AR max vel: 1.51 cm2
AV Area VTI: 1.6 cm2
AV Area mean vel: 1.4 cm2
AV Mean grad: 5 mmHg
AV Peak grad: 8.6 mmHg
Ao pk vel: 1.47 m/s
Area-P 1/2: 7.16 cm2
Height: 63 in
S' Lateral: 3 cm
Weight: 2892.44 oz

## 2021-12-31 LAB — URINALYSIS, ROUTINE W REFLEX MICROSCOPIC
Bilirubin Urine: NEGATIVE
Glucose, UA: NEGATIVE mg/dL
Ketones, ur: NEGATIVE mg/dL
Leukocytes,Ua: NEGATIVE
Nitrite: NEGATIVE
Protein, ur: NEGATIVE mg/dL
Specific Gravity, Urine: 1.013 (ref 1.005–1.030)
pH: 6 (ref 5.0–8.0)

## 2021-12-31 LAB — SURGICAL PCR SCREEN
MRSA, PCR: NEGATIVE
Staphylococcus aureus: NEGATIVE

## 2021-12-31 LAB — LIPID PANEL
Cholesterol: 241 mg/dL — ABNORMAL HIGH (ref 0–200)
HDL: 58 mg/dL (ref >40)
LDL Cholesterol: 162 mg/dL — ABNORMAL HIGH (ref 0–99)
Total CHOL/HDL Ratio: 4.2 RATIO
Triglycerides: 104 mg/dL (ref <150)
VLDL: 21 mg/dL (ref 0–40)

## 2021-12-31 LAB — HEPARIN LEVEL (UNFRACTIONATED)
Heparin Unfractionated: 0.21 IU/mL — ABNORMAL LOW (ref 0.30–0.70)
Heparin Unfractionated: 0.23 IU/mL — ABNORMAL LOW (ref 0.30–0.70)

## 2021-12-31 LAB — CBC
HCT: 35.6 % — ABNORMAL LOW (ref 36.0–46.0)
Hemoglobin: 11.7 g/dL — ABNORMAL LOW (ref 12.0–15.0)
MCH: 28.7 pg (ref 26.0–34.0)
MCHC: 32.9 g/dL (ref 30.0–36.0)
MCV: 87.5 fL (ref 80.0–100.0)
Platelets: 189 10*3/uL (ref 150–400)
RBC: 4.07 MIL/uL (ref 3.87–5.11)
RDW: 13.9 % (ref 11.5–15.5)
WBC: 11 10*3/uL — ABNORMAL HIGH (ref 4.0–10.5)
nRBC: 0 % (ref 0.0–0.2)

## 2021-12-31 LAB — MAGNESIUM: Magnesium: 2.2 mg/dL (ref 1.7–2.4)

## 2021-12-31 LAB — POCT ACTIVATED CLOTTING TIME
Activated Clotting Time: 269 seconds
Activated Clotting Time: 287 seconds

## 2021-12-31 LAB — GLUCOSE, CAPILLARY
Glucose-Capillary: 137 mg/dL — ABNORMAL HIGH (ref 70–99)
Glucose-Capillary: 166 mg/dL — ABNORMAL HIGH (ref 70–99)

## 2021-12-31 LAB — BRAIN NATRIURETIC PEPTIDE: B Natriuretic Peptide: 326.2 pg/mL — ABNORMAL HIGH (ref 0.0–100.0)

## 2021-12-31 LAB — HEMOGLOBIN A1C
Hgb A1c MFr Bld: 5.6 % (ref 4.8–5.6)
Mean Plasma Glucose: 114.02 mg/dL

## 2021-12-31 LAB — TROPONIN I (HIGH SENSITIVITY): Troponin I (High Sensitivity): >24000 ng/L (ref <18)

## 2021-12-31 MED ORDER — ONDANSETRON HCL 4 MG/2ML IJ SOLN
4.0000 mg | Freq: Four times a day (QID) | INTRAMUSCULAR | Status: DC | PRN
  Administered 2021-12-31 – 2022-01-01 (×2): 4 mg via INTRAVENOUS
  Filled 2021-12-31 (×2): qty 2

## 2021-12-31 MED ORDER — ASPIRIN EC 81 MG PO TBEC
81.0000 mg | DELAYED_RELEASE_TABLET | Freq: Every day | ORAL | Status: DC
  Administered 2021-12-31 – 2022-01-08 (×8): 81 mg via ORAL
  Filled 2021-12-31 (×8): qty 1

## 2021-12-31 MED ORDER — ORAL CARE MOUTH RINSE: 15.0000 mL | Freq: Two times a day (BID) | OROMUCOSAL | Status: DC

## 2021-12-31 MED ORDER — ALUM & MAG HYDROXIDE-SIMETH 200-200-20 MG/5ML PO SUSP
30.0000 mL | ORAL | Status: DC | PRN
  Administered 2021-12-31 – 2022-01-08 (×5): 30 mL via ORAL
  Filled 2021-12-31 (×5): qty 30

## 2021-12-31 MED ORDER — POTASSIUM CHLORIDE CRYS ER 20 MEQ PO TBCR
40.0000 meq | EXTENDED_RELEASE_TABLET | ORAL | Status: AC
  Administered 2021-12-31: 40 meq via ORAL
  Filled 2021-12-31: qty 2

## 2021-12-31 MED ORDER — CHLORHEXIDINE GLUCONATE CLOTH 2 % EX PADS
6.0000 | MEDICATED_PAD | Freq: Every day | CUTANEOUS | Status: DC
  Administered 2021-12-31 – 2022-01-04 (×5): 6 via TOPICAL

## 2021-12-31 MED ORDER — HEPARIN (PORCINE) 25000 UT/250ML-% IV SOLN
1000.0000 [IU]/h | INTRAVENOUS | Status: DC
  Administered 2021-12-31: 650 [IU]/h via INTRAVENOUS
  Administered 2022-01-01: 1000 [IU]/h via INTRAVENOUS
  Filled 2021-12-31 (×2): qty 250

## 2021-12-31 MED ORDER — FUROSEMIDE 10 MG/ML IJ SOLN
40.0000 mg | Freq: Once | INTRAMUSCULAR | Status: AC
Start: 2021-12-31 — End: 2021-12-31
  Administered 2021-12-31: 40 mg via INTRAVENOUS
  Filled 2021-12-31: qty 4

## 2021-12-31 MED ORDER — CLONAZEPAM 0.25 MG PO TBDP
0.5000 mg | ORAL_TABLET | Freq: Two times a day (BID) | ORAL | Status: DC
  Administered 2021-12-31 – 2022-01-03 (×8): 0.5 mg via ORAL
  Filled 2021-12-31 (×9): qty 2

## 2021-12-31 MED ORDER — IOHEXOL 300 MG/ML  SOLN
INTRAMUSCULAR | Status: DC | PRN
  Administered 2021-12-31: 105 mL

## 2021-12-31 MED ORDER — ACETAMINOPHEN 325 MG PO TABS
650.0000 mg | ORAL_TABLET | ORAL | Status: DC | PRN
  Administered 2021-12-31 – 2022-01-07 (×9): 650 mg via ORAL
  Filled 2021-12-31 (×9): qty 2

## 2021-12-31 MED ORDER — SODIUM CHLORIDE 0.9 % IV SOLN: INTRAVENOUS | Status: DC | PRN

## 2021-12-31 MED ORDER — NITROGLYCERIN IN D5W 200-5 MCG/ML-% IV SOLN
INTRAVENOUS | Status: AC
  Administered 2021-12-31: 50 mg
  Filled 2021-12-31: qty 250

## 2021-12-31 MED ORDER — METOPROLOL TARTRATE 12.5 MG HALF TABLET: 12.5000 mg | ORAL_TABLET | Freq: Two times a day (BID) | ORAL | Status: DC

## 2021-12-31 MED ORDER — METOPROLOL TARTRATE 25 MG PO TABS
25.0000 mg | ORAL_TABLET | Freq: Two times a day (BID) | ORAL | Status: AC
  Administered 2021-12-31 – 2022-01-05 (×12): 25 mg via ORAL
  Filled 2021-12-31 (×13): qty 1

## 2021-12-31 MED ORDER — SODIUM CHLORIDE 0.9 % IV SOLN
INTRAVENOUS | Status: DC | PRN
Start: 2021-12-31 — End: 2022-01-08

## 2021-12-31 MED ORDER — NITROGLYCERIN IN D5W 200-5 MCG/ML-% IV SOLN
0.0000 ug/min | INTRAVENOUS | Status: DC
  Filled 2021-12-31: qty 250

## 2021-12-31 MED ORDER — ATORVASTATIN CALCIUM 80 MG PO TABS
80.0000 mg | ORAL_TABLET | Freq: Every day | ORAL | Status: DC
  Administered 2021-12-31 – 2022-01-08 (×8): 80 mg via ORAL
  Filled 2021-12-31 (×9): qty 1

## 2021-12-31 MED ORDER — NITROGLYCERIN 0.4 MG SL SUBL: 0.4000 mg | SUBLINGUAL_TABLET | SUBLINGUAL | Status: DC | PRN

## 2021-12-31 NOTE — Assessment & Plan Note (Addendum)
IABP placed for ongoing chest pain with left main-LAD disease. ? ?She been seen and evaluated by CVTS, thought to be a poor candidate for surgery due to chronic morbidity, recent MI, need for Plavix washout etc.  (Appreciate CVTS consultation.) ? ?Options now are medical therapy versus CH IP-high risk left main-LAD PCI. ?Scheduled for 12 noon 01/01/2022 with Dr. Braulio Bosch for -> IVUS guided atherectomy versus shockwave lithotripsy LM-ostial LAD with PCI LM-LAD and mid LAD.  Planning single stent bifurcation LAD-LCx PCI with provisional PTCA or stenting LCx. ? ?Pre-cath orders in - IVF ordered ? ?Will hopefully remove IABP post PCI f stable ?

## 2021-12-31 NOTE — Care Management (Signed)
?  Transition of Care (TOC) Screening Note ? ? ?Patient Details  ?Name: Kristy Patterson ?Date of Birth: 09-30-1935 ? ? ?Transition of Care (TOC) CM/SW Contact:    ?Graves-Bigelow, Lamar Laundry, RN ?Phone Number: ?12/31/2021, 11:35 AM ? ? ? ?Transition of Care Department [facility]) has reviewed the patient and no TOC needs have been identified at this time. We will continue to monitor patient advancement through interdisciplinary progression rounds. If new patient transition needs arise, please place a TOC consult. ?  ?

## 2021-12-31 NOTE — Progress Notes (Signed)
ANTICOAGULATION CONSULT NOTE  ?Pharmacy Consult for Heparin  ?Indication: IABP s/p STEMI  ?Brief A/P: Heparin level subtherapeutic Increase Heparin rate ? ?Allergies  ?Allergen Reactions  ? Fd&C Blue #1 (Brilliant Blue Fcf) Nausea Only  ? Isopto Atropine [Atropine] Swelling  ?  Dried me out "made my throat close up"  ? Miralax [Polyethylene Glycol] Other (See Comments)  ?  Severe stomach upset  ? Aldactone [Spironolactone] Nausea Only and Other (See Comments)  ?  Lip numbness ?Constipation  ? Banophen [Diphenhydramine Hcl] Nausea Only and Palpitations  ? Compazine [Prochlorperazine Edisylate] Anxiety  ?  Gets the shakes   ? Iodinated Contrast Media Palpitations  ?  States she's not to have contrast because of kidneys ?GI distress  ? Norvasc [Amlodipine] Palpitations  ? ? ?Patient Measurements: ?Height: 5\' 3"  (160 cm) ?Weight: 82 kg (180 lb 12.4 oz) ?IBW/kg (Calculated) : 52.4 ? ?Vital Signs: ?Temp: 98.2 ?F (36.8 ?C) (05/04 1935) ?Temp Source: Oral (05/04 1935) ?BP: 129/84 (05/04 2000) ? ?Labs: ?Recent Labs  ?  12/30/21 ?2211 12/31/21 ?0429 12/31/21 ?1211 12/31/21 ?2235  ?HGB 13.1  --  11.7*  --   ?HCT 40.4  --  35.6*  --   ?PLT 270  --  189  --   ?LABPROT 12.4  --   --   --   ?INR 0.9  --   --   --   ?HEPARINUNFRC  --   --  0.21* 0.23*  ?CREATININE 1.28* 1.28*  --   --   ?TROPONINIHS 4,097* >24,000*  --   --   ? ? ? ?Estimated Creatinine Clearance: 33.2 mL/min (A) (by C-G formula based on SCr of 1.28 mg/dL (H)). ? ? ?Assessment: ?86 y/o female transfer from [facility] with STEMI s/p cath and on IABP for heparin.  ? ?Goal of Therapy:  ?Heparin level 0.3-0.5 units/ml ?Monitor platelets by anticoagulation protocol: Yes ?  ?Plan:  ?Increase Heparin 1000 units/hr ?Follow-up am labs.  ? ?[facility], PharmD, BCPS ? ? ? ? ? ?

## 2021-12-31 NOTE — Plan of Care (Signed)

## 2021-12-31 NOTE — Assessment & Plan Note (Signed)
Continue home dose levothyroxine ?

## 2021-12-31 NOTE — Progress Notes (Signed)
? ? ? ?  Transferred from [facility] following Emergent LCx-OM PCI due to LM-LAD diease to determine definitive Rx option- CABG vs. LM-LAD PCI ([facility]). ? ?Loaded with Plavis late last PM in ER -- will need to cover with IV Aggrastat if plan is CABG, otherwise will redose tonite. ? ? ?Increase BB Dose ?Continue IABP. ?Get Echo ?Tentatively schedule for LM - LAD PCI with IABP in place (Dr. Eldridge Dace - 5/5). ? ?Total time with patient / family - 35 minl additional 30 min reviewing images & discussing with IC colleagues & consulting CVTS. -- total time 65 minl Critical Care time.  ? ?Full note to follow ? ?Bryan Lemma, MD ? ? ? ?

## 2021-12-31 NOTE — Assessment & Plan Note (Signed)
Lab Results  ?Component Value Date  ? CHOL 241 (H) 12/31/2021  ? HDL 58 12/31/2021  ? LDLCALC 162 (H) 12/31/2021  ? TRIG 104 12/31/2021  ? CHOLHDL 4.2 12/31/2021  ? ?? Has been started on 80 mg atorvastatin.  Suspect that she may benefit from PCSK9 either based on significant elevated LDL.  (Outpatient lipid clinic) ?

## 2021-12-31 NOTE — Assessment & Plan Note (Addendum)
Presented to [facility] 12/30/2021 with inferior STEMI-taken to the Cath Lab and found to have thrombotic subtotal occlusion of LCx-OM 1, along with LM-ostial LAD 80 to 90% stenosis (apparently hazy/calcified), along with 80% mid LAD and RCA lesions. ?Relatively preserved EF on echo with anterolateral hypokinesis consistent with LCx and LAD disease. ? ?Emergent PCI to LCx-OM1 performed due to abrupt closure. ?Transferred to [facility] for evaluation and treatment of left main disease. ?

## 2021-12-31 NOTE — Assessment & Plan Note (Addendum)
Associated with chronic abdominal pain no signs of bleeding.  We will need to monitor closely while on aspirin and Plavix. ? ?1 unit drop in blood count following catheter and PCI.  Hgb still stable at 11.7.   ?No SSx of C-Diff -- will d/c Enteric Precautions. ?

## 2021-12-31 NOTE — Progress Notes (Signed)
ANTICOAGULATION CONSULT NOTE - Initial Consult ? ?Pharmacy Consult for Heparin  ?Indication: IABP, STEMI  ? ?Allergies  ?Allergen Reactions  ? Atropine Swelling  ?  Dried me out "made my throat close up"  ? Amlodipine Palpitations  ? Compazine [Prochlorperazine Edisylate] Anxiety  ?  Gets the shakes   ? Diphenhydramine Hcl Nausea Only and Palpitations  ? Spironolactone Nausea Only  ?  Also caused lip numbness and constipation  ? ? ?Patient Measurements: ?Height: 5\' 3"  (160 cm) ?Weight: 82 kg (180 lb 12.4 oz) ?IBW/kg (Calculated) : 52.4 ? ?Vital Signs: ?Temp: 98.2 ?F (36.8 ?C) (05/04 0145) ?Temp Source: Oral (05/04 0145) ?BP: 142/73 (05/04 0300) ?Pulse Rate: 91 (05/04 0315) ? ?Labs: ?Recent Labs  ?  12/30/21 ?2211  ?HGB 13.1  ?HCT 40.4  ?PLT 270  ?LABPROT 12.4  ?INR 0.9  ?CREATININE 1.28*  ?TROPONINIHS [id number]*  ? ? ?Estimated Creatinine Clearance: 33.2 mL/min (A) (by C-G formula based on SCr of 1.28 mg/dL (H)). ? ? ?Medical History: ?Past Medical History:  ?Diagnosis Date  ? Acquired hypothyroidism 10/06/2015  ? Anemia   ? Diverticulitis   ? GERD (gastroesophageal reflux disease)   ? Hypertension   ? Hypertensive urgency 02/22/2016  ? Irritable bowel syndrome (IBS)   ? Precordial chest pain 02/23/2016  ? Recurrent UTI 09/18/2015  ? Renal disorder   ? Sleep apnea   ? Stage 3 chronic kidney disease ([place])   ? Thyroid disease   ? ?Assessment: ?86 y/o F transfer from [facility] with STEMI s/p cath with multi-vessel CAD and DES x 1. IABP was placed in the cath lab. Consideration being given for return trip to cath lab. No current issues per RN. Labs above.  ? ?Goal of Therapy:  ?Heparin level 0.3-0.5 units/ml ?Monitor platelets by anticoagulation protocol: Yes ?  ?Plan:  ?Start heparin drip at 650 units/hr ?1200 heparin level ?Daily CBC and heparin level ?Monitor for bleeding ? ?Narda Bonds, PharmD, BCPS ?Clinical Pharmacist ?Phone: [phone] ? ? ? ?

## 2021-12-31 NOTE — Progress Notes (Signed)
Son updated by this RN per pt request. ?

## 2021-12-31 NOTE — Assessment & Plan Note (Addendum)
DES stent placed in LCx and mid OM1 overnight. ?Has been loaded with clopidogrel in the ER, - redosed last PM  - will redose this AM pre-cath ?Anticipate long-term Plavix (but likely only DAPT x 1 yr) following LM -LAD PCI ?

## 2021-12-31 NOTE — Progress Notes (Signed)
ANTICOAGULATION CONSULT NOTE - Initial Consult ? ?Pharmacy Consult for Heparin  ?Indication: IABP, STEMI  ? ?Allergies  ?Allergen Reactions  ? Fd&C Blue #1 (Brilliant Blue Fcf) Nausea Only  ? Isopto Atropine [Atropine] Swelling  ?  Dried me out "made my throat close up"  ? Miralax [Polyethylene Glycol] Other (See Comments)  ?  Severe stomach upset  ? Aldactone [Spironolactone] Nausea Only and Other (See Comments)  ?  Lip numbness ?Constipation  ? Banophen [Diphenhydramine Hcl] Nausea Only and Palpitations  ? Compazine [Prochlorperazine Edisylate] Anxiety  ?  Gets the shakes   ? Iodinated Contrast Media Palpitations  ?  States she's not to have contrast because of kidneys ?GI distress  ? Norvasc [Amlodipine] Palpitations  ? ? ?Patient Measurements: ?Height: 5\' 3"  (160 cm) ?Weight: 82 kg (180 lb 12.4 oz) ?IBW/kg (Calculated) : 52.4 ? ?Vital Signs: ?Temp: 98.2 ?F (36.8 ?C) (05/04 0800) ?Temp Source: Oral (05/04 0800) ?BP: 130/78 (05/04 0800) ?Pulse Rate: 93 (05/04 0400) ? ?Labs: ?Recent Labs  ?  12/30/21 ?2211 12/31/21 ?0429  ?HGB 13.1  --   ?HCT 40.4  --   ?PLT 270  --   ?LABPROT 12.4  --   ?INR 0.9  --   ?CREATININE 1.28* 1.28*  ?TROPONINIHS 4,097* >24,000*  ? ? ? ?Estimated Creatinine Clearance: 33.2 mL/min (A) (by C-G formula based on SCr of 1.28 mg/dL (H)). ? ? ?Medical History: ?Past Medical History:  ?Diagnosis Date  ? Acquired hypothyroidism 10/06/2015  ? Anemia   ? Diverticulitis   ? GERD (gastroesophageal reflux disease)   ? Hypertension   ? Hypertensive urgency 02/22/2016  ? Irritable bowel syndrome (IBS)   ? Precordial chest pain 02/23/2016  ? Recurrent UTI 09/18/2015  ? Renal disorder   ? Sleep apnea   ? Stage 3 chronic kidney disease ([place])   ? Thyroid disease   ? ?Assessment: ?86 y/o F transfer from [facility] with STEMI s/p cath with multi-vessel CAD and DES x 1. IABP was placed in the cath lab. Now considering CABG vs repeat PCI.  ? ?Heparin level was subtherapeutic at 0.21. Hemoglobin had a slight decrease to  11.7, platelets continue to be stable. No issues with bleeding or infusion per RN. ? ?Goal of Therapy:  ?Heparin level 0.3-0.5 units/ml ?Monitor platelets by anticoagulation protocol: Yes ?  ?Plan:  ?Increase heparin to 800 units/hr ?2200 heparin level ?Daily CBC and heparin level ?Monitor for bleeding ? ?Thank you for including pharmacy in the care of this patient. ? ?Zenaida Deed, PharmD ?PGY1 Acute Care Pharmacy Resident  ?Phone: [phone] ?12/31/2021  3:28 PM ? ?Please check [email or web address] for unit-specific pharmacy phone numbers. ? ? ? ? ?

## 2021-12-31 NOTE — Assessment & Plan Note (Addendum)
Current creatinine 1.28.  Mildly elevated.  We will hold off on starting ARB until post cath to avoid combination effect of ARB plus contrast. ?Slight increase from yesterday - but relatively stable - IVF ordered pre-cath ?

## 2021-12-31 NOTE — H&P (Addendum)
Cardiology Admission History and Physical:   Patient ID: Kristy Patterson MRN: [id number]; DOB: 10-07-1935   Admission date: 12/31/2021  PCP:  Mick Sell, MD   [facility] Providers Cardiologist:  None        Chief Complaint:  Transfer for STEMI post PCI LCx residual high grade LM/LAD  Patient Profile:   Kristy Patterson is a 86 y.o. female with hypertension, chronic kidney disease, prior diverticulitis, recurrent UTIs who underwent PCI to LCx today in the setting of STEMI with residual LM/LAD/RCA obstructive disease sent for complex staged revascularization.  History of Present Illness:   Kristy Patterson is an 86 year old female with PMHx as above who presented with STEMI to [facility] where she underwent PCI to her left circumflex.  She has residual obstructive distal LM, ostial LAD, and mid LAD stenoses.  IABP placed prior to transfer.  She reports 10/10 pain on arrival to [place] now down to 5/10, though the pain is quite distractible and she is resting comfortably during her discussions with me.  Her major concern was awareness of the balloon pump intrathoracically.  She reports that for about 10 days prior to presentation she'd been feeling winded with poor exertional tolerance.  She had noticed some worsening swelling of her bilateral ankles.  She thought that the upper abdominal discomfort she was experiencing might be a flare of her diverticulitis but it acutely worsened this evening which provoked her to present.  She has no prior CAD history.   Past Medical History:  Diagnosis Date   Acquired hypothyroidism 10/06/2015   Anemia    Diverticulitis    GERD (gastroesophageal reflux disease)    Hypertension    Hypertensive urgency 02/22/2016   Irritable bowel syndrome (IBS)    Precordial chest pain 02/23/2016   Recurrent UTI 09/18/2015   Renal disorder    Sleep apnea    Stage 3 chronic kidney disease (HCC)    Thyroid disease     Past Surgical History:   Procedure Laterality Date   ABDOMINAL HYSTERECTOMY  1971   COLON SURGERY  2010   colorectal   HEMORRHOID SURGERY  1967   PELVIC FLOOR REPAIR     anterior colporrhaphy, posterior colporraphy and colpoperineorrhaphy     Medications Prior to Admission: Prior to Admission medications   Medication Sig Start Date End Date Taking? Authorizing Provider  acetaminophen (TYLENOL) 325 MG tablet Take 2 tablets (650 mg total) by mouth every 6 (six) hours as needed for mild pain, moderate pain, fever or headache (or Fever >/= 101). Patient taking differently: Take 650 mg by mouth as needed for mild pain, moderate pain, fever or headache (or Fever >/= 101).  08/07/19   Shon Hale, MD  amoxicillin-clavulanate (AUGMENTIN) 600-42.9 MG/5ML suspension Take 7.70mL twice daily x 14 days 03/25/20   Midge Minium, MD  amoxicillin-clavulanate (AUGMENTIN) 875-125 MG tablet Take 1 tablet by mouth 2 (two) times daily. 03/22/20   [provider]  ciprofloxacin (CIPRO) 500 MG tablet Take 1 tablet (500 mg total) by mouth 2 (two) times daily. 03/05/20   Midge Minium, MD  cyanocobalamin 1000 MCG tablet Inject into the muscle.    [provider]  diltiazem (DILACOR XR) 120 MG 24 hr capsule Take 1 capsule (120 mg total) by mouth daily. 08/13/20   Freddy Finner, NP  levothyroxine (SYNTHROID) 100 MCG tablet TAKE 1 TABLET BY MOUTH ONCE DAILY BEFORE BREAKFAST 11/26/20   Kerri Perches, MD  losartan (COZAAR) 50 MG tablet  Take 1 tablet (50 mg total) by mouth 2 (two) times daily. For BP 08/07/19   Emokpae, Courage, MD  metroNIDAZOLE (FLAGYL) 500 MG tablet Take 1 tablet (500 mg total) by mouth 3 (three) times daily. 03/05/20   Midge Minium, MD  polyethylene glycol (MIRALAX / GLYCOLAX) 17 g packet Take 17 g by mouth as needed.     [provider]  saccharomyces boulardii (FLORASTOR) 250 MG capsule Take 1 capsule (250 mg total) by mouth 2 (two) times daily. Patient not taking: Reported on 02/26/2020 12/04/19    Freddy Finner, NP  telmisartan (MICARDIS) 40 MG tablet Take by mouth. Patient not taking: Reported on 02/26/2020 02/05/20 02/04/21  [provider]     Allergies:    Allergies  Allergen Reactions   Atropine Swelling    Dried me out "made my throat close up"   Amlodipine Palpitations   Compazine [Prochlorperazine Edisylate] Anxiety    Gets the shakes    Diphenhydramine Hcl Nausea Only and Palpitations   Spironolactone Nausea Only    Also caused lip numbness and constipation    Social History:   Social History   Socioeconomic History   Marital status: Widowed    Spouse name: Not on file   Number of children: Not on file   Years of education: Not on file   Highest education level: Not on file  Occupational History   Not on file  Tobacco Use   Smoking status: Never   Smokeless tobacco: Never  Substance and Sexual Activity   Alcohol use: Never   Drug use: Never   Sexual activity: Not on file  Other Topics Concern   Not on file  Social History Narrative   Pt recently moved to area    Social Determinants of Health   Financial Resource Strain: Not on file  Food Insecurity: Not on file  Transportation Needs: Not on file  Physical Activity: Not on file  Stress: Not on file  Social Connections: Not on file  Intimate Partner Violence: Not on file    Family History:  The patient's family history includes Cervical cancer in her mother; Lung disease in her father.    ROS:  Please see the history of present illness.  All other ROS reviewed and negative.     Physical Exam/Data:   Vitals:   12/31/21 0145 12/31/21 0200 12/31/21 0215 12/31/21 0230  BP:  139/86    Pulse: 83 91 94 91  Resp: 16 19 20 13   Temp: 98.2 F (36.8 C)     TempSrc: Oral     SpO2: 99% 100% 99% 99%  Weight: 82 kg     Height: 5\' 3"  (1.6 m)       Intake/Output Summary (Last 24 hours) at 12/31/2021 0235 Last data filed at 12/31/2021 0221 Gross per 24 hour  Intake 2.32 ml  Output --  Net  2.32 ml      12/31/2021    1:45 AM 12/30/2021   10:03 PM 06/03/2021   10:47 AM  Last 3 Weights  Weight (lbs) 180 lb 12.4 oz 170 lb 158 lb 15.2 oz  Weight (kg) 82 kg 77.111 kg 72.1 kg     Body mass index is 32.02 kg/m.  General:  Well nourished, well developed, in no acute distress lying comfortably in bed  HEENT: normal Neck: Difficult to assess (stuck lying flat, habitus) Vascular: No carotid bruits; Distal pulses 2+ bilaterally   Cardiac:  IABP sounds obscure heart sounds. Lungs:  clear to auscultation bilaterally, no wheezing, rhonchi or rales  Abd: soft, nontender, no hepatomegaly  Ext: 1+ bilateral pedal edema, cool feet, warm shins, R>L cool but dopplerable PT bilaterally.  IABP site CDI no hematoma.  R radial site palpable distal radial pulse no hematoma with radial band in place. Musculoskeletal:  No deformities, BUE and BLE strength normal and equal Skin: warm and dry  Neuro:  CNs 2-12 intact, no focal abnormalities noted Psych:  Normal affect   IABP augmenting appropriately 1:2 returned 1:1  EKG:  NSR serial changes of inferior MI with resolving lateral STE, persistent reciprocal septal depressions.  Relevant CV Studies: LHC LVEDP 30 mmHg Obstructive dLM, oLAD, mLAD, pRCA. Culprid mLCx s/p 2.75 x 22 onyx  Laboratory Data:  High Sensitivity Troponin:   Recent Labs  Lab 12/30/21 2211  TROPONINIHS 4,097*      Chemistry Recent Labs  Lab 12/30/21 2211  NA 129*  K 3.3*  CL 94*  CO2 23  GLUCOSE 178*  BUN 18  CREATININE 1.28*  CALCIUM 8.7*  GFRNONAA 41*  ANIONGAP 12    Recent Labs  Lab 12/30/21 2211  PROT 7.3  ALBUMIN 3.7  AST 57*  ALT 18  ALKPHOS 104  BILITOT 0.5   Lipids No results for input(s): CHOL, TRIG, HDL, LABVLDL, LDLCALC, CHOLHDL in the last 168 hours. Hematology Recent Labs  Lab 12/30/21 2211  WBC 12.2*  RBC 4.66  HGB 13.1  HCT 40.4  MCV 86.7  MCH 28.1  MCHC 32.4  RDW 13.8  PLT 270   Thyroid No results for input(s): TSH,  FREET4 in the last 168 hours. BNPNo results for input(s): BNP, PROBNP in the last 168 hours.  DDimer No results for input(s): DDIMER in the last 168 hours.   Radiology/Studies:  CARDIAC CATHETERIZATION  Result Date: 12/31/2021   Mid LM lesion is 80% stenosed.   Ost LAD lesion is 95% stenosed.   Prox LAD lesion is 80% stenosed.   Prox Cx to Mid Cx lesion is 99% stenosed.   Prox RCA lesion is 70% stenosed.   A drug-eluting stent was successfully placed using a STENT ONYX FRONTIER 2.75X22.   Post intervention, there is a 0% residual stenosis.   The left ventricular systolic function is normal.   LV end diastolic pressure is normal.   There is mild aortic valve stenosis. Conclusion: Severe three-vessel coronary artery disease including 80% distal LMCA, 95% ostial LAD, 80% mid LAD, 70% RCA. Culprit lesion is 99% thrombotic occlusion of the mid left circumflex Elevated LVEDP of approximately 30 mmHg Normal left ventricular systolic function Successful PCI to the mid left circumflex with placement of a 2.75 x 22 mm Onyx DES with excellent angiographic result and TIMI-3 flow 40 cc right femoral IABP placed for coronary perfusion with plans for revascularization of the distal left main in the next few days Recommendations: 1.  Dual antiplatelet therapy with aspirin and Plavix for 12 months, ideally longer 2.  Emergent transfer to [facility] with IABP in place for consideration of high risk PCI to the left main coronary artery 3.  Recommend initiation of heparin for IABP on transfer to [facility]     Assessment and Plan:   82F with CKD3, recurrent diverticulitis, recurrent urinary tract infections, hyperlipidemia presented with STEMI now s/p PCI LCx with plavix load.  Residual obstructive LM --> mLAD and proximal RCA stenosis.  Chest pain is much improved post-revasc and IABP but still some mild residual discomfort.  She  is hypertensive at bedside with wide pulse pressure.  Will start nitroglycerin  infusion and low dose beta blocker to control the angina.  She is mildly hypervolemic by exam with elevated LVEDP on catheterization today; will gently diurese to optimize prior to expected percutaneous revascularization.  I understand discussion is still ongoing regarding optimal revascularization strategy.  She was loaded with clopidogrel so will hold on ordering for now.  Once decision is made in morning can either continue or bridge with [id number].  #STEMI s/p PCI LCx - ASA - PLAVIX IS NOT ORDERED pending revasc plan - metoprolol tartrate 12.5 mg q6h - nitroglycerin infusion titrate to pain free & MAP >65 mmHg - continue IABP 1:1 - heparin infusion - high intensity statin - echo - risk stratification labs - trend troponin - LM revascularization pending  #Acute heart failure syndrome - BB as above - Hold ACE/ARB with the repeat contrast loads - Furosemide 40 mg IV x1  #Hx CKD - Avoid nephrotoxins  #Hx recurrent UTIs - Urinalysis and reflex though appears asymtpomatic  #Hx diverticulitis - bowel regimen - stop diltiazem with constipating effects  Full code NPO Strict bedrest  Risk Assessment/Risk Scores:    TIMI Risk Score for ST  Elevation MI:   The patient's TIMI risk score is 6, which indicates a 16.1% risk of all cause mortality at 30 days.   New York Heart Association (NYHA) Functional Class NYHA Class II     Severity of Illness: The appropriate patient status for this patient is INPATIENT. Inpatient status is judged to be reasonable and necessary in order to provide the required intensity of service to ensure the patient's safety. The patient's presenting symptoms, physical exam findings, and initial radiographic and laboratory data in the context of their chronic comorbidities is felt to place them at high risk for further clinical deterioration. Furthermore, it is not anticipated that the patient will be medically stable for discharge from the hospital within 2  midnights of admission.   * I certify that at the point of admission it is my clinical judgment that the patient will require inpatient hospital care spanning beyond 2 midnights from the point of admission due to high intensity of service, high risk for further deterioration and high frequency of surveillance required.*   For questions or updates, please contact [facility] Please consult [email or web address] for contact info under     Signed, Kristy Schultze, MD  12/31/2021 2:35 AM

## 2021-12-31 NOTE — Progress Notes (Addendum)
Family ? ?Progress Note ? ?Patient Name: Kristy Patterson ?Date of Encounter: 01/05/2022 ? ?[facility] Cardiologist: Andrez Grime, MD Dr. Corky Sox [facility] ? ? ?Patient Profile  ?   ?86 y.o. female with prior history of hypertension, CKD, diverticulitis and ureterovaginal fistula, recurrent UTIs who presented to [facility] on the evening of 12/30/2021 with apparent inferior ST elevation MI following 4 days of stuttering chest pain worsening the evening.  The ER she was given IV heparin, aspirin as well as 600 mg clopidogrel.  Taken to the Cath Lab by Dr. Corky Sox and found to have hazy 99 % thrombotic hazy lesion in the LCx thought to be the culprit lesion.  With guide catheter found to have 80% distal LMCA, 90% ostial LAD, 80% mid LAD lesion lesion as well as symptoms in RCA in addition to the culprit lesion. ?Initial telephone call with on-call surgeon (Dr. Cyndia Bent) indicated that with Plavix load, would be a poor candidate for CABG.  Therefore, PCI of the LCx was performed.  => IABP placed 2/2 ongoing angina.. ? ?Assessment & Plan  ?  ?Principal Problem: ?  Acute ST elevation myocardial infarction (STEMI) of inferior wall (HCC) ?Active Problems: ?  Left main coronary artery disease ?  Coronary artery disease involving native coronary artery of native heart with unstable angina pectoris ([place]) ?  Presence of drug coated stent in left circumflex coronary artery ?  Hyperlipidemia with target LDL less than 70 ?  Essential hypertension ?  CKD (chronic kidney disease), stage III ([place]) ?  Rectovaginal fistula ?  Diverticulosis of colon ?  Hypothyroidism ?  Acute diastolic heart failure ([place]) ? ?Acute ST elevation myocardial infarction (STEMI) of inferior wall (HCC) ?Presented to [facility] 12/30/2021 with inferior STEMI-taken to the Cath Lab and found to have thrombotic subtotal occlusion of LCx-OM 1, along with LM-ostial LAD 80 to 90% stenosis (apparently hazy/calcified), along with 80% mid LAD and RCA lesions. ?Relatively preserved  EF on echo with anterolateral hypokinesis consistent with LCx and LAD disease. ? ?Emergent PCI to LCx-OM1 performed due to abrupt closure. ?Transferred to [facility] for evaluation and treatment of left main disease. ? ?Presence of drug coated stent in left circumflex coronary artery ?DES stent placed in LCx and mid OM1 overnight. ?Has been loaded with clopidogrel in the ER, - redosed last PM  - will redose this AM pre-cath ?Anticipate long-term Plavix (but likely only DAPT x 1 yr) following LM -LAD PCI ? ?Left main coronary artery disease ?IABP placed for ongoing chest pain with left main-LAD disease. ? ?She been seen and evaluated by CVTS, thought to be a poor candidate for surgery due to chronic morbidity, recent MI, need for Plavix washout etc.  (Appreciate CVTS consultation.) ? ?Options now are medical therapy versus CH IP-high risk left main-LAD PCI. ?Scheduled for 12 noon 01/01/2022 with Dr. Braulio Bosch for -> IVUS guided atherectomy versus shockwave lithotripsy LM-ostial LAD with PCI LM-LAD and mid LAD.  Planning single stent bifurcation LAD-LCx PCI with provisional PTCA or stenting LCx. ? ?Pre-cath orders in - IVF ordered ? ?Will hopefully remove IABP post PCI f stable ? ?Coronary artery disease involving native coronary artery of native heart with unstable angina pectoris ([place]) ?New diagnosis of multiple CAD. ?On aspirin-we will restart Plavix ?On high-dose statin. ?BP stable.  We will start Lopressor 25 mg twice daily (this will replace her home dose of diltiazem) ?RCA disease - Med Rx for now -- can address @ later date.  Likely in  OP setting. ? ?Hyperlipidemia with target LDL less than 70 ?Lab Results  ?Component Value Date  ? CHOL 241 (H) 12/31/2021  ? HDL 58 12/31/2021  ? LDLCALC 162 (H) 12/31/2021  ? TRIG 104 12/31/2021  ? CHOLHDL 4.2 12/31/2021  ? ?Has been started on 80 mg atorvastatin.  Suspect that she may benefit from PCSK9 either based on significant elevated LDL.  (Outpatient lipid  clinic) ? ?Essential hypertension ?BP stable.  Augmenting well with IABP. ?We will DC home dose of diltiazem in order to start low-dose metoprolol-Lopressor 25 mg twice daily (will convert to Toprol on discharge) ? ?Pending reassessment of blood pressure after PCI we will consider the possibility of ARB.  Thankfully, echo shows relatively preserved EF. ? ?CKD (chronic kidney disease), stage III ([place]) ?Current creatinine 1.28.  Mildly elevated.  We will hold off on starting ARB until post cath to avoid combination effect of ARB plus contrast. ?Slight increase from yesterday - but relatively stable - IVF ordered pre-cath ? ?Rectovaginal fistula ?Ongoing abdominal pain, and mild dysuria.  Will check UA. ? ?Diverticulosis of colon ?Associated with chronic abdominal pain no signs of bleeding.  We will need to monitor closely while on aspirin and Plavix. ? ?1 unit drop in blood count following catheter and PCI.  Hgb still stable at 11.7.   ?No SSx of C-Diff -- will d/c Enteric Precautions. ? ?Hypothyroidism ?Continue home dose levothyroxine ? ?Acute diastolic heart failure ([place]) ?Elevated LVEDP on initial presentation -- IABP in place.   ?Will probably need IV Lasix, but will hold for now pending reassessment of rneal function & measurement of LVEDP during PCI. ? ? ?By CVTS team-Myron Roddenberry, PA and Dr. Cyndia Bent -with chart review, felt to be a very high risk patient for CABG.  Multiple reasons listed which completely makes sense making her high risk and recommended proceeding with CHIP PCI.  Case fully discussed with Dr. Irish Lack, scheduled for tomorrow with likely IVUS and CSI atherectomy /shockwave lithotripsy supported PCI with IABP in place. ? ?Dispo/Plan: ?Bedrest in ICU, and IABP 1: 1 ?IV heparin, and IV NTG as BP tolerates. ?Initiate 75 mg daily clopidogrel this evening. ?[place] PCI 01/01/2022 (Dr. Irish Lack) ?High-dose statin, likely needs PCSK9 inhibitor. ?Low-dose beta-blocker started, titrate up as  tolerated. ?Consider ARB post cath if renal function stable. ?With dysuria and history of rectovaginal fistula, check UA ? ?CRITICAL CARE ? ?The patient is critically ill with multiple organ systems failure and requires high complexity decision making for assessment and support, frequent evaluation and titration of therapies, application of advanced monitoring technologies and extensive interpretation of multiple databases.  ?  ?Critical Care Time devoted to patient care services described in this note is  95 Minutes.  ? ?Performed [id number] Ellyn Hack ?  ?Critical care time was exclusive of separately billable procedures and treating other patients. ? ?Critical care was necessary to treat or prevent imminent or life-threatening deterioration. ? ?Critical care was time spent personally by me on the following activities: development of treatment plan with patient and/or surrogate as well as nursing, discussions with consultants, evaluation of patient's response to treatment, examination of patient, obtaining history from patient or surrogate, ordering and performing treatments and interventions, ordering and review of laboratory studies, ordering and review of radiographic studies, pulse oximetry and re-evaluation of patient's condition. ? ?This time reflects time of care of this signee Glenetta Hew, MD ?This critical care time does not reflect procedure time, or teaching time or supervisory time of PA/NP/Med student/Med  Resident etc but could involve care discussion time with the patient, family & RN staff. ? ?Shared Decision Making/Informed Consent ?The risks [stroke (1 in 1000), death (1 in 61), kidney failure [usually temporary] (1 in 500), bleeding (1 in 200), allergic reaction [possibly serious] (1 in 200)], benefits (diagnostic support and management of coronary artery disease) and alternatives of a cardiac catheterization were discussed in detail with Kristy Patterson (along with her daughter) and she is willing to  proceed. ->  Additional discussion for CHIP PCI of LM with IABP pursued.  This does incur a higher risk concept but in order to back to prior.  I did quote a roughly 10 to 20% risk of adverse outcome includi

## 2021-12-31 NOTE — Discharge Summary (Signed)
? ?  Physician Discharge Summary  ?Patient ID: ?Kristy Patterson ?MRN: [id number] ?DOB/AGE: 1935-11-06 86 y.o. ? ?Admit date: 12/30/2021 ?Discharge date: 12/31/2021 ? ?Primary Discharge Diagnosis STEMI ?Secondary Discharge Diagnosis Critical 3 vessel coronary artery disease ? ?Significant Diagnostic Studies: Cardiac catheterization ? ?Consults: cardiology ? ?Hospital Course:  ?Kristy Patterson is an 86 year old female with a history of hypertension, chronic kidney disease who presented with 4 days of stuttering chest pain which worsened tonight and was persistent.  EKG showed inferior ST elevation.  She was taken emergently for heart catheterization which showed a culprit vessel of a proximal left circumflex with thrombus.  Additionally she had coronary disease including a 90% distal LMCA stenosis that was relatively hazy, 95% ostial LAD stenosis, and a mid 80% LAD stenosis at the location of a big diagonal.  Additionally her RCA had a 70% stenosis.  She had already been administered Plavix, and I called and discussed this with the surgeon on-call at [facility] who do not feel she would be an appropriate surgical candidate.  I opted to fix the culprit vessel and placed a drug-eluting stent to her mid left circumflex, placed a balloon pump in her right femoral artery for coronary perfusion.  At the time of transfer she was hemodynamically stable and her chest pain was significantly improved. ? ?Patient is being transferred emergently to [facility] for further management of her coronary disease ? ?Discharge Exam: ?Blood pressure (!) 144/90, pulse 68, temperature 98 ?F (36.7 ?C), resp. rate 18, height 5\' 3"  (1.6 m), weight 77.1 kg, SpO2 100 %.  ? ?  ?General appearance: alert and cooperative ?Resp: clear to auscultation bilaterally ?Cardio: regular rate and rhythm, S1, S2 normal, no murmur, click, rub or gallop ?Extremities: extremities normal, atraumatic, no cyanosis or edema ?Pulses: 2+ and symmetric ?Neurologic: Grossly  normal ?Incision/Wound: ?Labs: ?  ?Lab Results  ?Component Value Date  ? WBC 12.2 (H) 12/30/2021  ? HGB 13.1 12/30/2021  ? HCT 40.4 12/30/2021  ? MCV 86.7 12/30/2021  ? PLT 270 12/30/2021  ?  ?Recent Labs  ?Lab 12/30/21 ?2211  ?NA 129*  ?K 3.3*  ?CL 94*  ?CO2 23  ?BUN 18  ?CREATININE 1.28*  ?CALCIUM 8.7*  ?PROT 7.3  ?BILITOT 0.5  ?ALKPHOS 104  ?ALT 18  ?AST 57*  ?GLUCOSE 178*  ? ? ?  ?Radiology: Cath film ?EKG: Sinus tachycardia, inferior ST elevation; RBBB ? ?FOLLOW UP PLANS AND APPOINTMENTS ? ? ? ? ?BRING ALL MEDICATIONS WITH YOU TO FOLLOW UP APPOINTMENTS ? ?Time spent with patient to include physician time: 50 minutes ?Signed: ? ?2212 MD ?12/31/2021, 12:33 AM ? ? ? ?

## 2021-12-31 NOTE — Progress Notes (Signed)
?  Echocardiogram ?2D Echocardiogram has been performed. ? ?Gerda Diss ?12/31/2021, 11:50 AM ?

## 2021-12-31 NOTE — Assessment & Plan Note (Signed)
BP stable.  Augmenting well with IABP. ?We will DC home dose of diltiazem in order to start low-dose metoprolol-Lopressor 25 mg twice daily (will convert to Toprol on discharge) ? ?Pending reassessment of blood pressure after PCI we will consider the possibility of ARB.  Thankfully, echo shows relatively preserved EF. ?

## 2021-12-31 NOTE — Assessment & Plan Note (Addendum)
New diagnosis of multiple CAD. ?? On aspirin-we will restart Plavix ?? On high-dose statin. ?? BP stable.  We will start Lopressor 25 mg twice daily (this will replace her home dose of diltiazem) ?? RCA disease - Med Rx for now -- can address @ later date.  Likely in OP setting. ?

## 2021-12-31 NOTE — Consult Note (Signed)
? ?   ?[address] ?      [place] [address] ?            [id number]       ? ?[name] ?[place] Record [id number] ?Date of Birth: 03-02-1936 ? ?Referring: No ref. provider found ?Primary Care: Leonel Ramsay, MD ?Primary Cardiologist:None ? ?Reason for Consult: ?Evaluation for potential surgical coronary revascularization in an 86yo female with left main and multivessel coronary artery disease presenting with acute STEMI. ? ? ?History of Present Illness:   ?Ms. Kristy Patterson is an 86 year old female with a past medical history significant for hypertension, stage III chronic kidney disease with baseline creatinine of 1.3, dyslipidemia, obstructive sleep apnea, rectovaginal fistula with recurring UTI's, and hypothyroidism.  She presented to the [facility] emergency room last evening complaining of epigastric discomfort and chest pain that had been occurring intermittently for the previous 3 to 4 days.  She had a more intense episode yesterday afternoon lasting about 4 hours prior to her arrival to the emergency department.  In the ED, EKG showed inferior ST segment elevations and a right bundle branch block.  Initial troponin was over 4000 and later trended to 24,000.  She was taken to the Cath Lab urgently where left heart catheterization demonstrated an 80% distal left main coronary artery stenosis along with three-vessel coronary disease.  The left circumflex coronary artery was felt to be the culprit lesion.  Ms. [name] was loaded with clopidogrel and percutaneous angioplasty was performed to the circumflex system.  A drug-eluting stent was placed.  She continued to have some chest pain while on the Cath Lab table so an intra-aortic balloon pump was placed by way of the right femoral artery.  Her chest pain subsided.  She was transferred to [facility] for further intervention and treatment for her residual coronary disease.  ?Ms. Mazer is a widow and  lives with her son.  She continues to drive and attends to her own errands including grocery shopping.  She admits to having trouble negotiating steps.  She is accompanied by her daughter during the course of this interview.  She is resting supine in the ICU with heparin and nitroglycerin infusions running.  An intra-aortic balloon pump is in place by way of the right femoral artery and is counterpulsating at a ratio 1:1.  She denies any chest pain.  Her vital signs have been stable. ? ?Current Activity/ Functional Status: ?Patient was independent with mobility/ambulation, transfers, ADL's, IADL's. ?  ?Zubrod Score: ?At the time of surgery this patient?s most appropriate activity status/level should be described as: ?[]     0    Normal activity, no symptoms ?[x]     1    Restricted in physical strenuous activity but ambulatory, able to do light work ?[]     2    Ambulatory and capable of self care, unable to do work activities, up and about more than 50%  Of the time                            ?[]     3    Only limited self care, in bed greater than 50% of waking hours ?[]     4    Completely disabled, no self care, confined to bed or chair ?[]     5    Moribund ? ?Past Medical History:  ?Diagnosis Date  ? Acquired hypothyroidism 10/06/2015  ?  Anemia   ? Diverticulitis   ? GERD (gastroesophageal reflux disease)   ? Hypertension   ? Hypertensive urgency 02/22/2016  ? Irritable bowel syndrome (IBS)   ? Precordial chest pain 02/23/2016  ? Recurrent UTI 09/18/2015  ? Renal disorder   ? Sleep apnea   ? Stage 3 chronic kidney disease ([place])   ? Thyroid disease   ? ? ?Past Surgical History:  ?Procedure Laterality Date  ? ABDOMINAL HYSTERECTOMY  1971  ? COLON SURGERY  2010  ? colorectal  ? CORONARY/GRAFT ACUTE MI REVASCULARIZATION Right 12/30/2021  ? DES x1 to Cx, IABP placed  ? CORONARY/GRAFT ACUTE MI REVASCULARIZATION N/A 12/30/2021  ? Procedure: Coronary/Graft Acute MI Revascularization;  Surgeon: Andrez Grime, MD;  Location:  [facility];  Service: Cardiovascular;  Laterality: N/A;  ? [place]  ? IABP INSERTION N/A 12/30/2021  ? Procedure: IABP Insertion;  Surgeon: Andrez Grime, MD;  Location: [facility];  Service: Cardiovascular;  Laterality: N/A;  ? LEFT HEART CATH AND CORONARY ANGIOGRAPHY N/A 12/30/2021  ? Procedure: LEFT HEART CATH AND CORONARY ANGIOGRAPHY;  Surgeon: Andrez Grime, MD;  Location: [facility];  Service: Cardiovascular;  Laterality: N/A;  ? PELVIC FLOOR REPAIR    ? anterior colporrhaphy, posterior colporraphy and colpoperineorrhaphy  ? ? ?Social History  ? ?Tobacco Use  ?Smoking Status Never  ?Smokeless Tobacco Never  ?  ?Social History  ? ?Substance and Sexual Activity  ?Alcohol Use Never  ? ? ? ?Allergies  ?Allergen Reactions  ? Fd&C Blue #1 (Brilliant Blue Fcf) Nausea Only  ? Isopto Atropine [Atropine] Swelling  ?  Dried me out "made my throat close up"  ? Miralax [Polyethylene Glycol] Other (See Comments)  ?  Severe stomach upset  ? Aldactone [Spironolactone] Nausea Only and Other (See Comments)  ?  Lip numbness ?Constipation  ? Banophen [Diphenhydramine Hcl] Nausea Only and Palpitations  ? Compazine [Prochlorperazine Edisylate] Anxiety  ?  Gets the shakes   ? Iodinated Contrast Media Palpitations  ?  States she's not to have contrast because of kidneys ?GI distress  ? Norvasc [Amlodipine] Palpitations  ? ? ?Current Facility-Administered Medications  ?Medication Dose Route Frequency Provider Last Rate Last Admin  ? 0.9 %  sodium chloride infusion   Intravenous PRN Nipp, Carriel T, MD 10 mL/hr at 12/31/21 1000 Infusion Verify at 12/31/21 1000  ? 0.9 %  sodium chloride infusion   Intravenous PRN Leonie Man, MD 10 mL/hr at 12/31/21 1000 Infusion Verify at 12/31/21 1000  ? acetaminophen (TYLENOL) tablet 650 mg  650 mg Oral Q4H PRN Nipp, Carriel T, MD   650 mg at 12/31/21 0353  ? alum & mag hydroxide-simeth (MAALOX/MYLANTA) 200-200-20 MG/5ML suspension 30 mL  30  mL Oral Q2H PRN Leonie Man, MD   30 mL at 12/31/21 0527  ? aspirin EC tablet 81 mg  81 mg Oral Daily Nipp, Carriel T, MD   81 mg at 12/31/21 0931  ? atorvastatin (LIPITOR) tablet 80 mg  80 mg Oral Daily Nipp, Carriel T, MD   80 mg at 12/31/21 0931  ? Chlorhexidine Gluconate Cloth 2 % PADS 6 each  6 each Topical Daily Nipp, Carriel T, MD   6 each at 12/31/21 0932  ? clonazePAM (KLONOPIN) disintegrating tablet 0.5 mg  0.5 mg Oral BID Leonie Man, MD   0.5 mg at 12/31/21 0932  ? heparin ADULT infusion 100 units/mL (25000 units/254mL)  650 Units/hr  Intravenous Continuous Erenest Blank, RPH 6.5 mL/hr at 12/31/21 1000 650 Units/hr at 12/31/21 1000  ? metoprolol tartrate (LOPRESSOR) tablet 25 mg  25 mg Oral BID Leonie Man, MD   25 mg at 12/31/21 0932  ? nitroGLYCERIN (NITROSTAT) SL tablet 0.4 mg  0.4 mg Sublingual Q5 Min x 3 PRN Nipp, Carriel T, MD      ? nitroGLYCERIN 50 mg in dextrose 5 % 250 mL (0.2 mg/mL) infusion  0-200 mcg/min Intravenous Titrated Nipp, Carriel T, MD 12 mL/hr at 12/31/21 1000 40 mcg/min at 12/31/21 1000  ? ondansetron (ZOFRAN) injection 4 mg  4 mg Intravenous Q6H PRN Nipp, Carriel T, MD      ? ? ?Medications Prior to Admission  ?Medication Sig Dispense Refill Last Dose  ? acetaminophen (TYLENOL) 500 MG tablet Take 500 mg by mouth every 6 (six) hours as needed for mild pain.   Past Week  ? Cholecalciferol (VITAMIN D3) 25 MCG (1000 UT) CAPS Take 1 capsule by mouth daily.   Past Week  ? cyanocobalamin 1000 MCG tablet Take 1,000 mcg by mouth every 30 (thirty) days.   Past Month  ? diltiazem (TIAZAC) 120 MG 24 hr capsule Take 120 mg by mouth 2 (two) times daily.   Past Week  ? fexofenadine (ALLEGRA) 180 MG tablet Take 180 mg by mouth daily as needed for allergies.   Past Week  ? levothyroxine (SYNTHROID) 125 MCG tablet Take 125 mcg by mouth daily.   Past Week  ? losartan (COZAAR) 50 MG tablet Take 1 tablet (50 mg total) by mouth 2 (two) times daily. For BP (Patient taking differently:  Take 50 mg by mouth 2 (two) times daily.) 60 tablet 5 Past Week  ? omeprazole (PRILOSEC OTC) 20 MG tablet Take 40 mg by mouth daily as needed (acid reflux).   Past Week  ? Polyvinyl Alcohol-Povidone (REFR

## 2021-12-31 NOTE — Assessment & Plan Note (Signed)
Ongoing abdominal pain, and mild dysuria.  Will check UA. ?

## 2022-01-01 ENCOUNTER — Encounter (HOSPITAL_COMMUNITY)
Admission: EM | Disposition: A | Discharge status: Home Health | Payer: Self-pay | Source: Other Acute Inpatient Hospital | Attending: Cardiology

## 2022-01-01 ENCOUNTER — Ambulatory Visit: Payer: Federal, State, Local not specified - PPO

## 2022-01-01 DIAGNOSIS — K5730 Diverticulosis of large intestine without perforation or abscess without bleeding: Secondary | ICD-10-CM | POA: Diagnosis not present

## 2022-01-01 DIAGNOSIS — I10 Essential (primary) hypertension: Secondary | ICD-10-CM

## 2022-01-01 DIAGNOSIS — N1831 Chronic kidney disease, stage 3a: Secondary | ICD-10-CM | POA: Diagnosis not present

## 2022-01-01 DIAGNOSIS — I25118 Atherosclerotic heart disease of native coronary artery with other forms of angina pectoris: Secondary | ICD-10-CM | POA: Diagnosis not present

## 2022-01-01 DIAGNOSIS — E785 Hyperlipidemia, unspecified: Secondary | ICD-10-CM

## 2022-01-01 DIAGNOSIS — I2119 ST elevation (STEMI) myocardial infarction involving other coronary artery of inferior wall: Secondary | ICD-10-CM | POA: Diagnosis not present

## 2022-01-01 DIAGNOSIS — I25110 Atherosclerotic heart disease of native coronary artery with unstable angina pectoris: Secondary | ICD-10-CM | POA: Diagnosis not present

## 2022-01-01 DIAGNOSIS — Z955 Presence of coronary angioplasty implant and graft: Secondary | ICD-10-CM

## 2022-01-01 DIAGNOSIS — N823 Fistula of vagina to large intestine: Secondary | ICD-10-CM

## 2022-01-01 HISTORY — PX: CORONARY STENT INTERVENTION: CATH118234

## 2022-01-01 HISTORY — PX: INTRAVASCULAR ULTRASOUND/IVUS: CATH118244

## 2022-01-01 HISTORY — PX: LEFT HEART CATH: CATH118248

## 2022-01-01 LAB — POCT ACTIVATED CLOTTING TIME
Activated Clotting Time: 311 seconds
Activated Clotting Time: 426 seconds
Activated Clotting Time: 305 seconds
Activated Clotting Time: 209 seconds
Activated Clotting Time: 185 seconds

## 2022-01-01 LAB — CBC
HCT: 36.6 % (ref 36.0–46.0)
Hemoglobin: 11.9 g/dL — ABNORMAL LOW (ref 12.0–15.0)
MCH: 28.8 pg (ref 26.0–34.0)
MCHC: 32.5 g/dL (ref 30.0–36.0)
MCV: 88.6 fL (ref 80.0–100.0)
Platelets: 189 10*3/uL (ref 150–400)
RBC: 4.13 MIL/uL (ref 3.87–5.11)
RDW: 14 % (ref 11.5–15.5)
WBC: 12.8 10*3/uL — ABNORMAL HIGH (ref 4.0–10.5)
nRBC: 0 % (ref 0.0–0.2)

## 2022-01-01 LAB — BASIC METABOLIC PANEL
Anion gap: 11 (ref 5–15)
BUN: 16 mg/dL (ref 8–23)
CO2: 21 mmol/L — ABNORMAL LOW (ref 22–32)
Calcium: 8.4 mg/dL — ABNORMAL LOW (ref 8.9–10.3)
Chloride: 98 mmol/L (ref 98–111)
Creatinine, Ser: 1.36 mg/dL — ABNORMAL HIGH (ref 0.44–1.00)
GFR, Estimated: 38 mL/min — ABNORMAL LOW (ref >60)
Glucose, Bld: 123 mg/dL — ABNORMAL HIGH (ref 70–99)
Potassium: 4.3 mmol/L (ref 3.5–5.1)
Sodium: 130 mmol/L — ABNORMAL LOW (ref 135–145)

## 2022-01-01 LAB — LIPOPROTEIN A (LPA): Lipoprotein (a): 11.5 nmol/L (ref <75.0)

## 2022-01-01 LAB — HEPARIN LEVEL (UNFRACTIONATED): Heparin Unfractionated: 0.41 IU/mL (ref 0.30–0.70)

## 2022-01-01 SURGERY — CORONARY STENT INTERVENTION
Anesthesia: LOCAL

## 2022-01-01 MED ORDER — SODIUM CHLORIDE 0.9 % IV SOLN: 250.0000 mL | INTRAVENOUS | Status: DC | PRN

## 2022-01-01 MED ORDER — VERAPAMIL HCL 2.5 MG/ML IV SOLN
INTRAVENOUS | Status: AC
  Filled 2022-01-01: qty 2

## 2022-01-01 MED ORDER — LIDOCAINE HCL (PF) 1 % IJ SOLN
INTRAMUSCULAR | Status: AC
  Filled 2022-01-01: qty 30

## 2022-01-01 MED ORDER — MORPHINE SULFATE (PF) 2 MG/ML IV SOLN
1.0000 mg | INTRAVENOUS | Status: AC | PRN
  Administered 2022-01-01 – 2022-01-06 (×3): 1 mg via INTRAVENOUS
  Filled 2022-01-01 (×3): qty 1

## 2022-01-01 MED ORDER — SODIUM CHLORIDE 0.9% FLUSH
3.0000 mL | Freq: Two times a day (BID) | INTRAVENOUS | Status: DC
  Administered 2022-01-01 – 2022-01-08 (×11): 3 mL via INTRAVENOUS

## 2022-01-01 MED ORDER — HEPARIN (PORCINE) IN NACL 1000-0.9 UT/500ML-% IV SOLN
INTRAVENOUS | Status: AC
  Filled 2022-01-01: qty 1000

## 2022-01-01 MED ORDER — LABETALOL HCL 5 MG/ML IV SOLN: 10.0000 mg | INTRAVENOUS | Status: AC | PRN

## 2022-01-01 MED ORDER — HYDRALAZINE HCL 20 MG/ML IJ SOLN: 10.0000 mg | INTRAMUSCULAR | Status: AC | PRN

## 2022-01-01 MED ORDER — SODIUM CHLORIDE 0.9% FLUSH: 3.0000 mL | INTRAVENOUS | Status: DC | PRN

## 2022-01-01 MED ORDER — METHYLPREDNISOLONE SODIUM SUCC 125 MG IJ SOLR
125.0000 mg | Freq: Once | INTRAMUSCULAR | Status: AC
  Administered 2022-01-01: 125 mg via INTRAVENOUS
  Filled 2022-01-01: qty 2

## 2022-01-01 MED ORDER — HEPARIN (PORCINE) IN NACL 1000-0.9 UT/500ML-% IV SOLN
INTRAVENOUS | Status: DC | PRN
  Administered 2022-01-01 (×2): 500 mL

## 2022-01-01 MED ORDER — NITROGLYCERIN 1 MG/10 ML FOR IR/CATH LAB
INTRA_ARTERIAL | Status: AC
  Filled 2022-01-01: qty 10

## 2022-01-01 MED ORDER — ASPIRIN 81 MG PO CHEW: 81.0000 mg | CHEWABLE_TABLET | Freq: Every day | ORAL | Status: DC

## 2022-01-01 MED ORDER — ACETAMINOPHEN 325 MG PO TABS: 650.0000 mg | ORAL_TABLET | ORAL | Status: DC | PRN

## 2022-01-01 MED ORDER — CLOPIDOGREL BISULFATE 75 MG PO TABS
75.0000 mg | ORAL_TABLET | Freq: Every day | ORAL | Status: DC
  Administered 2022-01-02 – 2022-01-08 (×7): 75 mg via ORAL
  Filled 2022-01-01 (×7): qty 1

## 2022-01-01 MED ORDER — MIDAZOLAM HCL 2 MG/2ML IJ SOLN
INTRAMUSCULAR | Status: AC
  Filled 2022-01-01: qty 2

## 2022-01-01 MED ORDER — NITROGLYCERIN 1 MG/10 ML FOR IR/CATH LAB
INTRA_ARTERIAL | Status: DC | PRN
Start: 2022-01-01 — End: 2022-01-01
  Administered 2022-01-01: 400 ug via INTRA_ARTERIAL

## 2022-01-01 MED ORDER — CLOPIDOGREL BISULFATE 75 MG PO TABS
75.0000 mg | ORAL_TABLET | Freq: Every day | ORAL | Status: DC
Start: 2022-01-01 — End: 2022-01-01
  Administered 2022-01-01: 75 mg via ORAL
  Filled 2022-01-01: qty 1

## 2022-01-01 MED ORDER — HEPARIN SODIUM (PORCINE) 1000 UNIT/ML IJ SOLN
INTRAMUSCULAR | Status: DC | PRN
Start: 2022-01-01 — End: 2022-01-01
  Administered 2022-01-01: 9000 [IU] via INTRAVENOUS
  Administered 2022-01-01: 2000 [IU] via INTRAVENOUS

## 2022-01-01 MED ORDER — ASPIRIN 81 MG PO CHEW
81.0000 mg | CHEWABLE_TABLET | ORAL | Status: AC
  Administered 2022-01-01: 81 mg via ORAL
  Filled 2022-01-01: qty 1

## 2022-01-01 MED ORDER — VIPERSLIDE LUBRICANT OPTIME
TOPICAL | Status: DC | PRN
  Administered 2022-01-01: 20 mL via SURGICAL_CAVITY

## 2022-01-01 MED ORDER — MIDAZOLAM HCL 2 MG/2ML IJ SOLN
INTRAMUSCULAR | Status: DC | PRN
  Administered 2022-01-01 (×2): 1 mg via INTRAVENOUS

## 2022-01-01 MED ORDER — IOHEXOL 350 MG/ML SOLN
INTRAVENOUS | Status: DC | PRN
  Administered 2022-01-01: 115 mL

## 2022-01-01 MED ORDER — SODIUM CHLORIDE 0.9% FLUSH
3.0000 mL | Freq: Two times a day (BID) | INTRAVENOUS | Status: DC
  Administered 2022-01-01 – 2022-01-08 (×13): 3 mL via INTRAVENOUS

## 2022-01-01 MED ORDER — HEPARIN SODIUM (PORCINE) 5000 UNIT/ML IJ SOLN
5000.0000 [IU] | Freq: Three times a day (TID) | INTRAMUSCULAR | Status: DC
  Administered 2022-01-01 – 2022-01-08 (×17): 5000 [IU] via SUBCUTANEOUS
  Filled 2022-01-01 (×16): qty 1

## 2022-01-01 MED ORDER — ONDANSETRON HCL 4 MG/2ML IJ SOLN: 4.0000 mg | Freq: Four times a day (QID) | INTRAMUSCULAR | Status: DC | PRN

## 2022-01-01 MED ORDER — HEPARIN SODIUM (PORCINE) 1000 UNIT/ML IJ SOLN
INTRAMUSCULAR | Status: AC
  Filled 2022-01-01: qty 10

## 2022-01-01 MED ORDER — VERAPAMIL HCL 2.5 MG/ML IV SOLN
INTRAVENOUS | Status: DC | PRN
  Administered 2022-01-01: 10 mL via INTRA_ARTERIAL

## 2022-01-01 MED ORDER — SODIUM CHLORIDE 0.9 % IV SOLN: INTRAVENOUS | Status: DC

## 2022-01-01 MED ORDER — CLOPIDOGREL BISULFATE 75 MG PO TABS: 75.0000 mg | ORAL_TABLET | Freq: Every day | ORAL | Status: DC

## 2022-01-01 MED ORDER — VERAPAMIL HCL 2.5 MG/ML IV SOLN
INTRAVENOUS | Status: AC
Start: 2022-01-01 — End: ?
  Filled 2022-01-01: qty 2

## 2022-01-01 MED ORDER — NITROGLYCERIN 1 MG/10 ML FOR IR/CATH LAB
INTRA_ARTERIAL | Status: DC | PRN
  Administered 2022-01-01: 200 ug via INTRACORONARY

## 2022-01-01 MED ORDER — FENTANYL CITRATE (PF) 100 MCG/2ML IJ SOLN
INTRAMUSCULAR | Status: AC
  Filled 2022-01-01: qty 2

## 2022-01-01 MED ORDER — FENTANYL CITRATE (PF) 100 MCG/2ML IJ SOLN
INTRAMUSCULAR | Status: DC | PRN
  Administered 2022-01-01 (×2): 25 ug via INTRAVENOUS

## 2022-01-01 MED ORDER — LIDOCAINE HCL (PF) 1 % IJ SOLN
INTRAMUSCULAR | Status: DC | PRN
  Administered 2022-01-01: 2 mL

## 2022-01-01 SURGICAL SUPPLY — 31 items
BALL SAPPHIRE NC24 4.0X12 (BALLOONS) ×2
BALLN SAPPHIRE 2.5X15 (BALLOONS) ×2
BALLOON SAPPHIRE 2.5X15 (BALLOONS) IMPLANT
BALLOON SAPPHIRE NC24 4.0X12 (BALLOONS) IMPLANT
CATH INFINITI JR4 5F (CATHETERS) ×1 IMPLANT
CATH LAUNCHER 6FR EBU3.5 (CATHETERS) ×1 IMPLANT
CATH OPTICROSS HD (CATHETERS) ×1 IMPLANT
CATH TELEPORT (CATHETERS) ×1 IMPLANT
CROWN DIAMONDBACK CLASSIC 1.25 (BURR) ×1 IMPLANT
DEVICE RAD COMP TR BAND LRG (VASCULAR PRODUCTS) ×1 IMPLANT
ELECT DEFIB PAD ADLT CADENCE (PAD) ×1 IMPLANT
GLIDESHEATH SLEND SS 6F .021 (SHEATH) ×2 IMPLANT
GUIDEWIRE INQWIRE 1.5J.035X260 (WIRE) IMPLANT
INQWIRE 1.5J .035X260CM (WIRE) ×2
KIT ENCORE 26 ADVANTAGE (KITS) ×1 IMPLANT
KIT HEART LEFT (KITS) ×2 IMPLANT
KIT HEMO VALVE WATCHDOG (MISCELLANEOUS) ×1 IMPLANT
LUBRICANT VIPERSLIDE CORONARY (MISCELLANEOUS) ×1 IMPLANT
MAT PREVALON FULL STRYKER (MISCELLANEOUS) ×1 IMPLANT
PACK CARDIAC CATHETERIZATION (CUSTOM PROCEDURE TRAY) ×2 IMPLANT
SHEATH PROBE COVER 6X72 (BAG) ×1 IMPLANT
SLED PULL BACK IVUS (MISCELLANEOUS) ×1 IMPLANT
STENT SYNERGY XD 3.0X16 (Permanent Stent) IMPLANT
STENT SYNERGY XD 3.50X16 (Permanent Stent) IMPLANT
SYNERGY XD 3.0X16 (Permanent Stent) ×2 IMPLANT
SYNERGY XD 3.50X16 (Permanent Stent) ×2 IMPLANT
TRANSDUCER W/STOPCOCK (MISCELLANEOUS) ×2 IMPLANT
TUBING CIL FLEX 10 FLL-RA (TUBING) ×2 IMPLANT
WIRE ASAHI PROWATER 180CM (WIRE) ×1 IMPLANT
WIRE HI TORQ BMW 190CM (WIRE) ×2 IMPLANT
WIRE VIPERWIRE COR FLEX .012 (WIRE) ×1 IMPLANT

## 2022-01-01 NOTE — Progress Notes (Signed)
IABP removed from right femoral artery. Manual pressure applied for 30 minutes. Site level 0, no s+s of hematoma. Tegaderm dressing applied, bedrest instructions given. ? ?Bilateral dp and pt pulses present with doppler.  ? ?Bedrest begins at 19:25:00 ?

## 2022-01-01 NOTE — H&P (View-Only) (Signed)
Family ? ?Progress Note ? ?Patient Name: Kristy Patterson ?Date of Encounter: 01/01/2022 ? ?[facility] Cardiologist: None Dr. Corky Patterson [facility] ? ? ?Patient Profile  ?   ?86 y.o. female with prior history of hypertension, CKD, diverticulitis and ureterovaginal fistula, recurrent UTIs who presented to [facility] on the evening of 12/30/2021 with apparent inferior ST elevation MI following 4 days of stuttering chest pain worsening the evening.  The ER she was given IV heparin, aspirin as well as 600 mg clopidogrel.  Taken to the Cath Lab by Dr. Corky Patterson and found to have hazy 99 % thrombotic hazy lesion in the LCx thought to be the culprit lesion.  With guide catheter found to have 80% distal LMCA, 90% ostial LAD, 80% mid LAD lesion lesion as well as symptoms in RCA in addition to the culprit lesion. ?Initial telephone call with on-call surgeon (Dr. Cyndia Patterson) indicated that with Plavix load, would be a poor candidate for CABG.  Therefore, PCI of the LCx was performed.  => IABP placed 2/2 ongoing angina.. ? ?Assessment & Plan  ?  ?Principal Problem: ?  Acute ST elevation myocardial infarction (STEMI) of inferior wall (HCC) ?Active Problems: ?  Left main coronary artery disease ?  Coronary artery disease involving native coronary artery of native heart with unstable angina pectoris ([place]) ?  Presence of drug coated stent in left circumflex coronary artery ?  Hyperlipidemia with target LDL less than 70 ?  Essential hypertension ?  CKD (chronic kidney disease), stage III ([place]) ?  Rectovaginal fistula ?  Diverticulosis of colon ?  Hypothyroidism ? ?Acute ST elevation myocardial infarction (STEMI) of inferior wall (HCC) ?Presented to [facility] 12/30/2021 with inferior STEMI-taken to the Cath Lab and found to have thrombotic subtotal occlusion of LCx-OM 1, along with LM-ostial LAD 80 to 90% stenosis (apparently hazy/calcified), along with 80% mid LAD and RCA lesions. ?Relatively preserved EF on echo with anterolateral hypokinesis consistent with  LCx and LAD disease. ? ?Emergent PCI to LCx-OM1 performed due to abrupt closure. ?Transferred to [facility] for evaluation and treatment of left main disease. ? ?Presence of drug coated stent in left circumflex coronary artery ?DES stent placed in LCx and mid OM1 overnight. ?Has been loaded with clopidogrel in the ER, - redosed last PM  - will redose this AM pre-cath ?Anticipate long-term Plavix (but likely only DAPT x 1 yr) following LM -LAD PCI ? ?Left main coronary artery disease ?IABP placed for ongoing chest pain with left main-LAD disease. ? ?She been seen and evaluated by CVTS, thought to be a poor candidate for surgery due to chronic morbidity, recent MI, need for Plavix washout etc.  (Appreciate CVTS consultation.) ? ?Options now are medical therapy versus CH IP-high risk left main-LAD PCI. ?Scheduled for 12 noon 01/01/2022 with Dr. Braulio Patterson for -> IVUS guided atherectomy versus shockwave lithotripsy LM-ostial LAD with PCI LM-LAD and mid LAD.  Planning single stent bifurcation LAD-LCx PCI with provisional PTCA or stenting LCx. ? ?Pre-cath orders in - IVF ordered ? ?Will hopefully remove IABP post PCI f stable ? ?Coronary artery disease involving native coronary artery of native heart with unstable angina pectoris ([place]) ?New diagnosis of multiple CAD. ?On aspirin-we will restart Plavix ?On high-dose statin. ?BP stable.  We will start Lopressor 25 mg twice daily (this will replace her home dose of diltiazem) ?RCA disease - Med Rx for now -- can address @ later date.  Likely in OP setting. ? ?Hyperlipidemia with target LDL less than 70 ?  Lab Results  ?Component Value Date  ? CHOL 241 (H) 12/31/2021  ? HDL 58 12/31/2021  ? LDLCALC 162 (H) 12/31/2021  ? TRIG 104 12/31/2021  ? CHOLHDL 4.2 12/31/2021  ? ?Has been started on 80 mg atorvastatin.  Suspect that she may benefit from PCSK9 either based on significant elevated LDL.  (Outpatient lipid clinic) ? ?Essential hypertension ?BP stable.  Augmenting  well with IABP. ?We will DC home dose of diltiazem in order to start low-dose metoprolol-Lopressor 25 mg twice daily (will convert to Toprol on discharge) ? ?Pending reassessment of blood pressure after PCI we will consider the possibility of ARB.  Thankfully, echo shows relatively preserved EF. ? ?CKD (chronic kidney disease), stage III ([place]) ?Current creatinine 1.28.  Mildly elevated.  We will hold off on starting ARB until post cath to avoid combination effect of ARB plus contrast. ?Slight increase from yesterday - but relatively stable - IVF ordered pre-cath ? ?Rectovaginal fistula ?Ongoing abdominal pain, and mild dysuria.  Will check UA. ? ?Diverticulosis of colon ?Associated with chronic abdominal pain no signs of bleeding.  We will need to monitor closely while on aspirin and Plavix. ? ?1 unit drop in blood count following catheter and PCI.  Hgb still stable at 11.7.   ?No SSx of C-Diff -- will d/c Enteric Precautions. ? ?Hypothyroidism ?Continue home dose levothyroxine ? ? ? ?Patient seen and evaluated by CVTS team 12/31/2021: Kristy Cutter, PA and Dr. Cyndia Patterson -with chart review, felt to be a very high risk patient for CABG.  Multiple reasons listed which completely makes sense making her high risk and recommended proceeding with CHIP PCI.  Case fully discussed with Dr. Irish Patterson, scheduled for tomorrow with likely IVUS and CSI atherectomy /shockwave lithotripsy supported PCI with IABP in place. ? ?Dispo/Plan: ?Received clopidogrel 75 mg at roughly 1 AM -> will redose at regular dosing interval today and daily going forward. ?bedrest in ICU, and IABP 1: 1 ?IV heparin, and IV NTG as BP tolerates. --> anticipate being off of IV heparin drip & weaning NTG drip post cath ?Scheduled for LM-LAD CHIP PCI w/ Dr. Irish Patterson (CSI Rep will be on-site) ? ?High-dose statin, likely needs PCSK9 inhibitor. ?Low-dose beta-blocker started, titrate up as tolerated. ?Consider ARB post cath if renal function stable. ?With dysuria  and history of rectovaginal fistula -> UA notable for rare bacteria, but no nitrates etc. ?Enteric precautions accidentally ordered 2/2 loose stools -- pt on laxatives @ home 2/ ureterovaginal fistula --> does not have loose stools now -- will d/c order. ? ?CRITICAL CARE ? ?The patient is critically ill with multiple organ systems failure and requires high complexity decision making for assessment and support, frequent evaluation and titration of therapies, application of advanced monitoring technologies and extensive interpretation of multiple databases.  ?  ?Critical Care Time devoted to patient care services described in this note is  45 Minutes.  ? ?Performed [id number] Ellyn Hack ?  ?Critical care time was exclusive of separately billable procedures and treating other patients. ? ?Critical care was necessary to treat or prevent imminent or life-threatening deterioration. ? ?Critical care was time spent personally by me on the following activities: development of treatment plan with patient and/or surrogate as well as nursing, discussions with consultants, evaluation of patient's response to treatment, examination of patient, obtaining history from patient or surrogate, ordering and performing treatments and interventions, ordering and review of laboratory studies, ordering and review of radiographic studies, pulse oximetry and re-evaluation of patient's condition. ? ?This time  reflects time of care of this signee Glenetta Hew, MD ?This critical care time does not reflect procedure time, or teaching time or supervisory time of PA/NP/Med student/Med Resident etc but could involve care discussion time with the patient, family & RN staff. ? ?Shared Decision Making/Informed Consent ?The risks [stroke (1 in 1000), death (1 in 83), kidney failure [usually temporary] (1 in 500), bleeding (1 in 200), allergic reaction [possibly serious] (1 in 200)], benefits (diagnostic support and management of coronary artery disease)  and alternatives of a cardiac catheterization were discussed in detail with Ms. Perra (along with her daughter) and she is willing to proceed. ->  Additional discussion for CHIP PCI of LM with IABP pur

## 2022-01-01 NOTE — Interval H&P Note (Signed)
History and Physical Interval Note: ? ?01/01/2022 ?12:34 PM ? ?Kristy Patterson  has presented today for surgery, with the diagnosis of CAD.  The various methods of treatment have been discussed with the patient and family. After consideration of risks, benefits and other options for treatment, the patient has consented to  Procedure(s): ?CORONARY STENT INTERVENTION (N/A) as a surgical intervention.  The patient's history has been reviewed, patient examined, no change in status, stable for surgery.  I have reviewed the patient's chart and labs.  Questions were answered to the patient's satisfaction.   ? ?Rotational atherectomy was explained to the patient and family. IABP in place for high risk PCI of left main and LAD.  High risk nature of procedure described. All questions answered.  ? ? ?Lance Muss ? ? ?

## 2022-01-01 NOTE — Progress Notes (Signed)
ANTICOAGULATION CONSULT NOTE - Initial Consult ? ?Pharmacy Consult for Heparin  ?Indication: IABP, STEMI  ? ?Allergies  ?Allergen Reactions  ? Fd&C Blue #1 (Brilliant Blue Fcf) Nausea Only  ? Isopto Atropine [Atropine] Swelling  ?  Dried me out "made my throat close up"  ? Miralax [Polyethylene Glycol] Other (See Comments)  ?  Severe stomach upset  ? Aldactone [Spironolactone] Nausea Only and Other (See Comments)  ?  Lip numbness ?Constipation  ? Banophen [Diphenhydramine Hcl] Nausea Only and Palpitations  ? Compazine [Prochlorperazine Edisylate] Anxiety  ?  Gets the shakes   ? Iodinated Contrast Media Palpitations  ?  States she's not to have contrast because of kidneys ?GI distress  ? Norvasc [Amlodipine] Palpitations  ? ? ?Patient Measurements: ?Height: 5\' 3"  (160 cm) ?Weight: 82 kg (180 lb 12.4 oz) ?IBW/kg (Calculated) : 52.4 ? ?Vital Signs: ?Temp: 98.1 ?F (36.7 ?C) (05/05 0800) ?Temp Source: Oral (05/05 0800) ?BP: 129/54 (05/05 0400) ?Pulse Rate: 79 (05/05 0800) ? ?Labs: ?Recent Labs  ?  12/30/21 ?2211 12/31/21 ?0429 12/31/21 ?1211 12/31/21 ?2235 01/01/22 ?0839  ?HGB 13.1  --  11.7*  --  11.9*  ?HCT 40.4  --  35.6*  --  36.6  ?PLT 270  --  189  --  189  ?LABPROT 12.4  --   --   --   --   ?INR 0.9  --   --   --   --   ?HEPARINUNFRC  --   --  0.21* 0.23* 0.41  ?CREATININE 1.28* 1.28*  --   --   --   ?TROPONINIHS 4,097* >24,000*  --   --   --   ? ? ? ?Estimated Creatinine Clearance: 33.2 mL/min (A) (by C-G formula based on SCr of 1.28 mg/dL (H)). ? ? ?Medical History: ?Past Medical History:  ?Diagnosis Date  ? Acquired hypothyroidism 10/06/2015  ? Anemia   ? Diverticulitis   ? GERD (gastroesophageal reflux disease)   ? Hypertension   ? Hypertensive urgency 02/22/2016  ? Irritable bowel syndrome (IBS)   ? Precordial chest pain 02/23/2016  ? Recurrent UTI 09/18/2015  ? Renal disorder   ? Sleep apnea   ? Stage 3 chronic kidney disease ([place])   ? Thyroid disease   ? ?Assessment: ?86 y/o F transfer from [facility] with STEMI s/p  cath with multi-vessel CAD and DES x 1. IABP was placed in the cath lab. Now planned for repeat PCI. ? ?Most recent heparin level is therapeutic at 0.41. CBC is stable. No issues with bleeding or infusion noted by RN. ? ?Goal of Therapy:  ?Heparin level 0.3-0.5 units/ml ?Monitor platelets by anticoagulation protocol: Yes ?  ?Plan:  ?Continue heparin at 1000 units/hr ?Confirmatory heparin level in 8h ?Daily CBC and heparin level ?Monitor for bleeding ? ?Thank you for including pharmacy in the care of this patient. ? ?Zenaida Deed, PharmD ?PGY1 Acute Care Pharmacy Resident  ?Phone: [phone] ?01/01/2022  9:16 AM ? ?Please check [email or web address] for unit-specific pharmacy phone numbers. ?

## 2022-01-01 NOTE — Interval H&P Note (Signed)
Cath Lab Visit (complete for each Cath Lab visit) ? ?Clinical Evaluation Leading to the Procedure:  ? ?ACS: Yes.   ? ?Non-ACS:   ? ?Anginal Classification: CCS IV ? ?Anti-ischemic medical therapy: Minimal Therapy (1 class of medications) ? ?Non-Invasive Test Results: No non-invasive testing performed ? ?Prior CABG: No previous CABG ? ? ? ? ? ?History and Physical Interval Note: ? ?01/01/2022 ?12:32 PM ? ?Kristy Patterson  has presented today for surgery, with the diagnosis of CAD.  The various methods of treatment have been discussed with the patient and family. After consideration of risks, benefits and other options for treatment, the patient has consented to  Procedure(s): ?CORONARY STENT INTERVENTION (N/A) as a surgical intervention.  The patient's history has been reviewed, patient examined, no change in status, stable for surgery.  I have reviewed the patient's chart and labs.  Questions were answered to the patient's satisfaction.   ? ? ?Lance Muss ? ? ?

## 2022-01-01 NOTE — Progress Notes (Signed)
Family ? ?Progress Note ? ?Patient Name: Kristy Patterson ?Date of Encounter: 01/01/2022 ? ?[facility] Cardiologist: None Dr. Corky Sox [facility] ? ? ?Patient Profile  ?   ?86 y.o. female with prior history of hypertension, CKD, diverticulitis and ureterovaginal fistula, recurrent UTIs who presented to [facility] on the evening of 12/30/2021 with apparent inferior ST elevation MI following 4 days of stuttering chest pain worsening the evening.  The ER she was given IV heparin, aspirin as well as 600 mg clopidogrel.  Taken to the Cath Lab by Dr. Corky Sox and found to have hazy 99 % thrombotic hazy lesion in the LCx thought to be the culprit lesion.  With guide catheter found to have 80% distal LMCA, 90% ostial LAD, 80% mid LAD lesion lesion as well as symptoms in RCA in addition to the culprit lesion. ?Initial telephone call with on-call surgeon (Dr. Cyndia Bent) indicated that with Plavix load, would be a poor candidate for CABG.  Therefore, PCI of the LCx was performed.  => IABP placed 2/2 ongoing angina.. ? ?Assessment & Plan  ?  ?Principal Problem: ?  Acute ST elevation myocardial infarction (STEMI) of inferior wall (HCC) ?Active Problems: ?  Left main coronary artery disease ?  Coronary artery disease involving native coronary artery of native heart with unstable angina pectoris ([place]) ?  Presence of drug coated stent in left circumflex coronary artery ?  Hyperlipidemia with target LDL less than 70 ?  Essential hypertension ?  CKD (chronic kidney disease), stage III ([place]) ?  Rectovaginal fistula ?  Diverticulosis of colon ?  Hypothyroidism ? ?Acute ST elevation myocardial infarction (STEMI) of inferior wall (HCC) ?Presented to [facility] 12/30/2021 with inferior STEMI-taken to the Cath Lab and found to have thrombotic subtotal occlusion of LCx-OM 1, along with LM-ostial LAD 80 to 90% stenosis (apparently hazy/calcified), along with 80% mid LAD and RCA lesions. ?Relatively preserved EF on echo with anterolateral hypokinesis consistent with  LCx and LAD disease. ? ?Emergent PCI to LCx-OM1 performed due to abrupt closure. ?Transferred to [facility] for evaluation and treatment of left main disease. ? ?Presence of drug coated stent in left circumflex coronary artery ?DES stent placed in LCx and mid OM1 overnight. ?Has been loaded with clopidogrel in the ER, - redosed last PM  - will redose this AM pre-cath ?Anticipate long-term Plavix (but likely only DAPT x 1 yr) following LM -LAD PCI ? ?Left main coronary artery disease ?IABP placed for ongoing chest pain with left main-LAD disease. ? ?She been seen and evaluated by CVTS, thought to be a poor candidate for surgery due to chronic morbidity, recent MI, need for Plavix washout etc.  (Appreciate CVTS consultation.) ? ?Options now are medical therapy versus CH IP-high risk left main-LAD PCI. ?Scheduled for 12 noon 01/01/2022 with Dr. Braulio Bosch for -> IVUS guided atherectomy versus shockwave lithotripsy LM-ostial LAD with PCI LM-LAD and mid LAD.  Planning single stent bifurcation LAD-LCx PCI with provisional PTCA or stenting LCx. ? ?Pre-cath orders in - IVF ordered ? ?Will hopefully remove IABP post PCI f stable ? ?Coronary artery disease involving native coronary artery of native heart with unstable angina pectoris ([place]) ?New diagnosis of multiple CAD. ?On aspirin-we will restart Plavix ?On high-dose statin. ?BP stable.  We will start Lopressor 25 mg twice daily (this will replace her home dose of diltiazem) ?RCA disease - Med Rx for now -- can address @ later date.  Likely in OP setting. ? ?Hyperlipidemia with target LDL less than 70 ?  Lab Results  ?Component Value Date  ? CHOL 241 (H) 12/31/2021  ? HDL 58 12/31/2021  ? LDLCALC 162 (H) 12/31/2021  ? TRIG 104 12/31/2021  ? CHOLHDL 4.2 12/31/2021  ? ?Has been started on 80 mg atorvastatin.  Suspect that she may benefit from PCSK9 either based on significant elevated LDL.  (Outpatient lipid clinic) ? ?Essential hypertension ?BP stable.  Augmenting  well with IABP. ?We will DC home dose of diltiazem in order to start low-dose metoprolol-Lopressor 25 mg twice daily (will convert to Toprol on discharge) ? ?Pending reassessment of blood pressure after PCI we will consider the possibility of ARB.  Thankfully, echo shows relatively preserved EF. ? ?CKD (chronic kidney disease), stage III ([place]) ?Current creatinine 1.28.  Mildly elevated.  We will hold off on starting ARB until post cath to avoid combination effect of ARB plus contrast. ?Slight increase from yesterday - but relatively stable - IVF ordered pre-cath ? ?Rectovaginal fistula ?Ongoing abdominal pain, and mild dysuria.  Will check UA. ? ?Diverticulosis of colon ?Associated with chronic abdominal pain no signs of bleeding.  We will need to monitor closely while on aspirin and Plavix. ? ?1 unit drop in blood count following catheter and PCI.  Hgb still stable at 11.7.   ?No SSx of C-Diff -- will d/c Enteric Precautions. ? ?Hypothyroidism ?Continue home dose levothyroxine ? ? ? ?Patient seen and evaluated by CVTS team 12/31/2021: [name], PA and Dr. Cyndia Bent -with chart review, felt to be a very high risk patient for CABG.  Multiple reasons listed which completely makes sense making her high risk and recommended proceeding with CHIP PCI.  Case fully discussed with Dr. Irish Lack, scheduled for tomorrow with likely IVUS and CSI atherectomy /shockwave lithotripsy supported PCI with IABP in place. ? ?Dispo/Plan: ?Received clopidogrel 75 mg at roughly 1 AM -> will redose at regular dosing interval today and daily going forward. ?bedrest in ICU, and IABP 1: 1 ?IV heparin, and IV NTG as BP tolerates. --> anticipate being off of IV heparin drip & weaning NTG drip post cath ?Scheduled for LM-LAD CHIP PCI w/ Dr. Irish Lack (CSI Rep will be on-site) ? ?High-dose statin, likely needs PCSK9 inhibitor. ?Low-dose beta-blocker started, titrate up as tolerated. ?Consider ARB post cath if renal function stable. ?With dysuria  and history of rectovaginal fistula -> UA notable for rare bacteria, but no nitrates etc. ?Enteric precautions accidentally ordered 2/2 loose stools -- pt on laxatives @ home 2/ ureterovaginal fistula --> does not have loose stools now -- will d/c order. ? ?CRITICAL CARE ? ?The patient is critically ill with multiple organ systems failure and requires high complexity decision making for assessment and support, frequent evaluation and titration of therapies, application of advanced monitoring technologies and extensive interpretation of multiple databases.  ?  ?Critical Care Time devoted to patient care services described in this note is  45 Minutes.  ? ?Performed [id number] Ellyn Hack ?  ?Critical care time was exclusive of separately billable procedures and treating other patients. ? ?Critical care was necessary to treat or prevent imminent or life-threatening deterioration. ? ?Critical care was time spent personally by me on the following activities: development of treatment plan with patient and/or surrogate as well as nursing, discussions with consultants, evaluation of patient's response to treatment, examination of patient, obtaining history from patient or surrogate, ordering and performing treatments and interventions, ordering and review of laboratory studies, ordering and review of radiographic studies, pulse oximetry and re-evaluation of patient's condition. ? ?This time  reflects time of care of this signee Glenetta Hew, MD ?This critical care time does not reflect procedure time, or teaching time or supervisory time of PA/NP/Med student/Med Resident etc but could involve care discussion time with the patient, family & RN staff. ? ?Shared Decision Making/Informed Consent ?The risks [stroke (1 in 1000), death (1 in 73), kidney failure [usually temporary] (1 in 500), bleeding (1 in 200), allergic reaction [possibly serious] (1 in 200)], benefits (diagnostic support and management of coronary artery disease)  and alternatives of a cardiac catheterization were discussed in detail with Ms. Poulos (along with her daughter) and she is willing to proceed. ->  Additional discussion for CHIP PCI of LM with IABP pur

## 2022-01-02 DIAGNOSIS — I25110 Atherosclerotic heart disease of native coronary artery with unstable angina pectoris: Secondary | ICD-10-CM | POA: Diagnosis not present

## 2022-01-02 DIAGNOSIS — I2119 ST elevation (STEMI) myocardial infarction involving other coronary artery of inferior wall: Secondary | ICD-10-CM | POA: Diagnosis not present

## 2022-01-02 LAB — BASIC METABOLIC PANEL
Anion gap: 9 (ref 5–15)
BUN: 20 mg/dL (ref 8–23)
CO2: 20 mmol/L — ABNORMAL LOW (ref 22–32)
Calcium: 8.3 mg/dL — ABNORMAL LOW (ref 8.9–10.3)
Chloride: 99 mmol/L (ref 98–111)
Creatinine, Ser: 1.15 mg/dL — ABNORMAL HIGH (ref 0.44–1.00)
GFR, Estimated: 47 mL/min — ABNORMAL LOW (ref >60)
Glucose, Bld: 132 mg/dL — ABNORMAL HIGH (ref 70–99)
Potassium: 4.4 mmol/L (ref 3.5–5.1)
Sodium: 128 mmol/L — ABNORMAL LOW (ref 135–145)

## 2022-01-02 LAB — CBC
HCT: 36 % (ref 36.0–46.0)
Hemoglobin: 12 g/dL (ref 12.0–15.0)
MCH: 29.1 pg (ref 26.0–34.0)
MCHC: 33.3 g/dL (ref 30.0–36.0)
MCV: 87.2 fL (ref 80.0–100.0)
Platelets: 147 10*3/uL — ABNORMAL LOW (ref 150–400)
RBC: 4.13 MIL/uL (ref 3.87–5.11)
RDW: 13.9 % (ref 11.5–15.5)
WBC: 14.5 10*3/uL — ABNORMAL HIGH (ref 4.0–10.5)
nRBC: 0 % (ref 0.0–0.2)

## 2022-01-02 LAB — MAGNESIUM: Magnesium: 2.3 mg/dL (ref 1.7–2.4)

## 2022-01-02 MED ORDER — BISACODYL 10 MG RE SUPP
10.0000 mg | Freq: Once | RECTAL | Status: AC
  Administered 2022-01-02: 10 mg via RECTAL
  Filled 2022-01-02: qty 1

## 2022-01-02 MED ORDER — LOSARTAN POTASSIUM 25 MG PO TABS
12.5000 mg | ORAL_TABLET | Freq: Every day | ORAL | Status: DC
Start: 2022-01-02 — End: 2022-01-03
  Administered 2022-01-02: 12.5 mg via ORAL
  Filled 2022-01-02: qty 1

## 2022-01-02 NOTE — Progress Notes (Signed)
CARDIAC REHAB PHASE I  ? ?[phone] ?Patient sitting up in chair attempting to eat lunch with daughter at side. C/O lack of appetite. Patient returning home with daughter to [facility]. Awaiting PT eval. Per daughter PT attempted to ambulate however patient just received suppository and awaiting relief from constipation. Daughter eager for education. MI booklet reviewed and diet information provided for independent reading. Briefly discussed phase 2 cardiac rehab. General brochure given. Patient will eventually need referral to CR close to daughters home at discharge. Patient encouraged to ambulate as tolerated once PT eval is complete. No transfer order today. Will continue to follow up on Monday.  ? ?Larwance Rote, BSN  ?

## 2022-01-02 NOTE — Progress Notes (Signed)
? ?Progress Note ? ?Patient Name: Kristy Patterson ?Date of Encounter: 01/02/2022 ? ?Primary Cardiologist: Andrez Grime, MD ? ?Subjective  ? ?Sitting in bedside chair having breakfast.  Daughter present today.  She reports no chest pain or shortness of breath.  Feels generally weak. ? ?Inpatient Medications  ?  ?Scheduled Meds: ? aspirin EC  81 mg Oral Daily  ? atorvastatin  80 mg Oral Daily  ? Chlorhexidine Gluconate Cloth  6 each Topical Daily  ? clonazepam  0.5 mg Oral BID  ? clopidogrel  75 mg Oral Q breakfast  ? heparin  5,000 Units Subcutaneous Q8H  ? metoprolol tartrate  25 mg Oral BID  ? sodium chloride flush  3 mL Intravenous Q12H  ? sodium chloride flush  3 mL Intravenous Q12H  ? ?Continuous Infusions: ? sodium chloride 10 mL/hr at 01/01/22 2000  ? sodium chloride 10 mL/hr at 01/01/22 0700  ? sodium chloride    ? nitroGLYCERIN Stopped (01/01/22 0239)  ? ?PRN Meds: ?sodium chloride, sodium chloride, sodium chloride, acetaminophen, alum & mag hydroxide-simeth, morphine injection, nitroGLYCERIN, ondansetron (ZOFRAN) IV, sodium chloride flush  ? ?Vital Signs  ?  ?Vitals:  ? 01/02/22 0400 01/02/22 0500 01/02/22 0600 01/02/22 0700  ?BP:  131/65 132/73 136/68  ?Pulse:      ?Resp: 14 13 (!) 21 15  ?Temp: 98.1 ?F (36.7 ?C)     ?TempSrc:      ?SpO2: 97% 96% 94% 97%  ?Weight:   81.6 kg   ?Height:      ? ? ?Intake/Output Summary (Last 24 hours) at 01/02/2022 0740 ?Last data filed at 01/02/2022 0600 ?Gross per 24 hour  ?Intake 1013.21 ml  ?Output 620 ml  ?Net 393.21 ml  ? ?Filed Weights  ? 12/31/21 0145 01/01/22 0723 01/02/22 0600  ?Weight: 82 kg 82 kg 81.6 kg  ? ? ?Telemetry  ?  ?Sinus rhythm with lead motion artifact.  Personally reviewed. ? ?ECG  ?  ?An ECG dated 01/02/2022 was personally reviewed today and demonstrated:  Sinus rhythm with right bundle branch block, left anterior fascicular block, nonspecific ST-T changes. ? ?Physical Exam  ? ?GEN: No acute distress.   ?Neck: No JVD. ?Cardiac: RRR, no murmur, rub, or  gallop.  ?Respiratory: Nonlabored.  Few rhonchi, no wheezing. ?GI: Soft, nontender, bowel sounds present. ?MS: No edema; No deformity. ?Neuro:  Nonfocal. ?Psych: Alert and oriented x 3. Normal affect. ? ?Labs  ?  ?Chemistry ?Recent Labs  ?Lab 12/30/21 ?2211 12/31/21 ?0429 01/01/22 ?[id number] 01/02/22 ?[id number]  ?NA 129* 130* 130* 128*  ?K 3.3* 4.1 4.3 4.4  ?CL 94* 96* 98 99  ?CO2 23 23 21* 20*  ?GLUCOSE 178* 121* 123* 132*  ?BUN 18 15 16 20   ?CREATININE 1.28* 1.28* 1.36* 1.15*  ?CALCIUM 8.7* 8.8* 8.4* 8.3*  ?PROT 7.3  --   --   --   ?ALBUMIN 3.7  --   --   --   ?AST 57*  --   --   --   ?ALT 18  --   --   --   ?ALKPHOS 104  --   --   --   ?BILITOT 0.5  --   --   --   ?GFRNONAA 41* 41* 38* 47*  ?ANIONGAP 12 11 11 9   ?  ? ?Hematology ?Recent Labs  ?Lab 12/31/21 ?1211 01/01/22 ?[id number] 01/02/22 ?[id number]  ?WBC 11.0* 12.8* 14.5*  ?RBC 4.07 4.13 4.13  ?HGB 11.7* 11.9* 12.0  ?HCT 35.6* 36.6 36.0  ?  MCV 87.5 88.6 87.2  ?MCH 28.7 28.8 29.1  ?MCHC 32.9 32.5 33.3  ?RDW 13.9 14.0 13.9  ?PLT 189 189 147*  ? ? ?Cardiac Enzymes ?Recent Labs  ?Lab 12/30/21 ?2211 12/31/21 ?0429  ?TROPONINIHS 4,097* >24,000*  ? ? ?BNP ?Recent Labs  ?Lab 12/31/21 ?0429  ?BNP 326.2*  ?  ? ?Radiology  ?  ?CARDIAC CATHETERIZATION ? ?Result Date: 01/01/2022 ?  Mid LM to Ost LAD lesion is 80% stenosed.  After orbital atherectomy, a drug-eluting stent was successfully placed using a SYNERGY XD 3.50X16, postdilated to 4 mm.  Optimized with IVUS.   Post intervention, there is a 0% residual stenosis.   Prox LAD lesion is 80% stenosed.  After orbital atherectomy, a drug-eluting stent was successfully placed using a SYNERGY XD 3.0X16; proximal stent postdilated to 4.0 mm.  Optimized with IVUS.   Post intervention, there is a 0% residual stenosis.   Mid Circumflex stent was patent.   Prox RCA lesion is 70% stenosed noted from prior cath.   LV end diastolic pressure is moderately elevated.   There is no aortic valve stenosis. Successful orbital atherectomy and stenting of the distal  left main, ostial LAD and mid LAD.  Continue dual antiplatelet therapy for at least 12 months.  Would suggest clopidogrel monotherapy beyond that time.  Continue aggressive secondary prevention.  She still has a right coronary artery lesion that can be addressed at a later time.  LVEDP was elevated.  She will need diuretics.  Heparin stopped.  Patient was hemodynamically stable throughout.  Hopeful to remove intra-aortic balloon pump later today.  ? ?ECHOCARDIOGRAM COMPLETE ? ?Result Date: 12/31/2021 ?   ECHOCARDIOGRAM REPORT   Patient Name:   Kristy Patterson Date of Exam: 12/31/2021 Medical Rec #:  [id number]  Height:       63.0 in Accession #:    [id number] Weight:       180.8 lb Date of Birth:  July 27, 1936   BSA:          1.852 m? Patient Age:    86 years   BP:           13/78 mmHg Patient Gender: F          HR:           79 bpm. Exam Location:  Inpatient Procedure: 2D Echo, Cardiac Doppler and Color Doppler Indications:     Myocardial infarct  History:         Patient has prior history of Echocardiogram examinations. Risk                  Factors:Hypertension. CKD. S/P PCI. IABP.  Sonographer:     Ross Ludwig RDCS (AE) Referring Phys:  [id number] Harlon Ditty NIPP Diagnosing Phys: [place] IMPRESSIONS  1. Mild anterolateral hypokinesis. Left ventricular ejection fraction, by estimation, is 50 to 55%. The left ventricle has low normal function. There is moderate left ventricular hypertrophy. Left ventricular diastolic parameters are consistent with Grade I diastolic dysfunction (impaired relaxation).  2. Right ventricular systolic function is mildly reduced. The right ventricular size is normal. Tricuspid regurgitation signal is inadequate for assessing PA pressure.  3. The mitral valve is normal in structure. Trivial mitral valve regurgitation.  4. There is moderate calcification of the aortic valve. Aortic valve regurgitation is not visualized.  5. The inferior vena cava is normal in size with greater than 50% respiratory  variability, suggesting right atrial pressure of 3 mmHg. FINDINGS  Left Ventricle: Mild anterolateral  hypokinesis. Left ventricular ejection fraction, by estimation, is 50 to 55%. The left ventricle has low normal function. The left ventricular internal cavity size was normal in size. There is moderate left ventricular  hypertrophy. Left ventricular diastolic parameters are consistent with Grade I diastolic dysfunction (impaired relaxation). Right Ventricle: The right ventricular size is normal. No increase in right ventricular wall thickness. Right ventricular systolic function is mildly reduced. Tricuspid regurgitation signal is inadequate for assessing PA pressure. Left Atrium: Left atrial size was normal in size. Right Atrium: Right atrial size was normal in size. Pericardium: There is no evidence of pericardial effusion. Presence of epicardial fat layer. Mitral Valve: The mitral valve is normal in structure. Trivial mitral valve regurgitation. Tricuspid Valve: The tricuspid valve is normal in structure. Tricuspid valve regurgitation is not demonstrated. Aortic Valve: There is moderate calcification of the aortic valve. Aortic valve regurgitation is not visualized. Aortic valve mean gradient measures 5.0 mmHg. Aortic valve peak gradient measures 8.6 mmHg. Aortic valve area, by VTI measures 1.60 cm?. Pulmonic Valve: The pulmonic valve was not well visualized. Pulmonic valve regurgitation is not visualized. Aorta: The aortic root and ascending aorta are structurally normal, with no evidence of dilitation. Venous: The inferior vena cava is normal in size with greater than 50% respiratory variability, suggesting right atrial pressure of 3 mmHg. IAS/Shunts: No atrial level shunt detected by color flow Doppler.  LEFT VENTRICLE PLAX 2D LVIDd:         4.30 cm   Diastology LVIDs:         3.00 cm   LV e' medial:    5.66 cm/s LV PW:         1.30 cm   LV E/e' medial:  16.4 LV IVS:        1.40 cm   LV e' lateral:   2.72 cm/s  LVOT diam:     1.80 cm   LV E/e' lateral: 34.2 LV SV:         39 LV SV Index:   21 LVOT Area:     2.54 cm?  RIGHT VENTRICLE RV Basal diam:  2.60 cm RV S prime:     93.80 cm/s TAPSE (M-mode): 1.5 cm LEFT ATRIU

## 2022-01-02 NOTE — Evaluation (Signed)
Physical Therapy Evaluation ?Patient Details ?Name: Kristy Patterson ?MRN: [id number] ?DOB: 1936/04/05 ?Today's Date: 01/02/2022 ? ?History of Present Illness ? Pt is a 86 y.o. F who presents 12/31/2021 with STEMI. Found to have hazy 99 % thrombotic hazy lesion in the LCx thought to be the culprit lesion.  With guide catheter found to have 80% distal LMCA, 90% ostial LAD, 80% mid LAD lesion as well as symptoms in RCA in addition to the culprit lesion. S/p PCI of LCx. Significant PMH: HTN, CKD, diverticulitis, recurrent UTIs.  ?Clinical Impression ? PTA, pt lives in a level entry apartment and is independent. Reports difficulty with balance and going up stairs, but denies falls. Pt plans to discharge home with her daughter initially. Pt presents with decreased cardiopulmonary endurance and balance deficits. Pt ambulating 100 ft with a walker at a min guard assist level, SpO2 98% on RA, HR 78-90's, BP 139/72 (91). Suspect steady progress. Recommend HHPT at discharge. ?   ? ?Recommendations for follow up therapy are one component of a multi-disciplinary discharge planning process, led by the attending physician.  Recommendations may be updated based on patient status, additional functional criteria and insurance authorization. ? ?Follow Up Recommendations Home health PT ? ?  ?Assistance Recommended at Discharge PRN  ?Patient can return home with the following ? A little help with walking and/or transfers;A little help with bathing/dressing/bathroom;Assistance with cooking/housework;Assist for transportation;Help with stairs or ramp for entrance ? ?  ?Equipment Recommendations Rolling walker (2 wheels)  ?Recommendations for Other Services ?    ?  ?Functional Status Assessment Patient has had a recent decline in their functional status and demonstrates the ability to make significant improvements in function in a reasonable and predictable amount of time.  ? ?  ?Precautions / Restrictions Precautions ?Precautions:  Fall ?Restrictions ?Weight Bearing Restrictions: No  ? ?  ? ?Mobility ? Bed Mobility ?  ?  ?  ?  ?  ?  ?  ?General bed mobility comments: OOB in chair ?  ? ?Transfers ?Overall transfer level: Needs assistance ?Equipment used: Rolling walker (2 wheels) ?Transfers: Sit to/from Stand ?Sit to Stand: Supervision ?  ?  ?  ?  ?  ?General transfer comment: cues for hand placement ?  ? ?Ambulation/Gait ?Ambulation/Gait assistance: Min guard ?Gait Distance (Feet): 100 Feet ?Assistive device: Rolling walker (2 wheels) ?Gait Pattern/deviations: Step-through pattern, Decreased stride length ?Gait velocity: decreased ?  ?  ?General Gait Details: Mild dynamic instability, requiring min guard assist for safety ? ?Stairs ?  ?  ?  ?  ?  ? ?Wheelchair Mobility ?  ? ?Modified Rankin (Stroke Patients Only) ?  ? ?  ? ?Balance Overall balance assessment: Mild deficits observed, not formally tested ?  ?  ?  ?  ?  ?  ?  ?  ?  ?  ?  ?  ?  ?  ?  ?  ?  ?  ?   ? ? ? ?Pertinent Vitals/Pain Pain Assessment ?Pain Assessment: No/denies pain  ? ? ?Home Living Family/patient expects to be discharged to:: Private residence ?Living Arrangements: Alone ?Available Help at Discharge: Family;Available PRN/intermittently ?Type of Home: Apartment ?Home Access: Level entry ?  ?  ?  ?Home Layout: One level ?Home Equipment: Rolling Walker (2 wheels) (husband's walker in storage) ?Additional Comments: Pt may d/c home with daughter who bought a "sleeper sofa" for downstairs and there is a 1/2 bath. 3 STE, no rail  ?  ?Prior Function Prior Level of  Function : Independent/Modified Independent;Driving ?  ?  ?  ?  ?  ?  ?Mobility Comments: Hx of balance difficulty and vertigo. Denies falls ?ADLs Comments: independent ADL's/IADL's ?  ? ? ?Hand Dominance  ?   ? ?  ?Extremity/Trunk Assessment  ? Upper Extremity Assessment ?Upper Extremity Assessment: Defer to OT evaluation ?  ? ?Lower Extremity Assessment ?Lower Extremity Assessment: RLE deficits/detail;LLE  deficits/detail ?RLE Deficits / Details: Grossly 4/5 ?LLE Deficits / Details: Grossly 4/5 ?  ? ?   ?Communication  ? Communication: No difficulties  ?Cognition Arousal/Alertness: Awake/alert ?Behavior During Therapy: [facility] for tasks assessed/performed ?Overall Cognitive Status: Within Functional Limits for tasks assessed ?  ?  ?  ?  ?  ?  ?  ?  ?  ?  ?  ?  ?  ?  ?  ?  ?  ?  ?  ? ?  ?General Comments   ? ?  ?Exercises    ? ?Assessment/Plan  ?  ?PT Assessment Patient needs continued PT services  ?PT Problem List Decreased strength;Decreased activity tolerance;Decreased balance;Decreased mobility ? ?   ?  ?PT Treatment Interventions Gait training;DME instruction;Stair training;Functional mobility training;Therapeutic activities;Therapeutic exercise;Balance training;Patient/family education   ? ?PT Goals (Current goals can be found in the Care Plan section)  ?Acute Rehab PT Goals ?Patient Stated Goal: back to independence ?PT Goal Formulation: With patient ?Time For Goal Achievement: 01/16/22 ?Potential to Achieve Goals: Good ? ?  ?Frequency Min 3X/week ?  ? ? ?Co-evaluation   ?  ?  ?  ?  ? ? ?  ?AM-PAC PT "6 Clicks" Mobility  ?Outcome Measure Help needed turning from your back to your side while in a flat bed without using bedrails?: A Little ?Help needed moving from lying on your back to sitting on the side of a flat bed without using bedrails?: A Little ?Help needed moving to and from a bed to a chair (including a wheelchair)?: A Little ?Help needed standing up from a chair using your arms (e.g., wheelchair or bedside chair)?: A Little ?Help needed to walk in hospital room?: A Little ?Help needed climbing 3-5 steps with a railing? : A Little ?6 Click Score: 18 ? ?  ?End of Session   ?Activity Tolerance: Patient tolerated treatment well ?Patient left: in chair;with call bell/phone within reach ?Nurse Communication: Mobility status ?PT Visit Diagnosis: Unsteadiness on feet (R26.81);Difficulty in walking, not elsewhere  classified (R26.2) ?  ? ?Time: 1435-1500 ?PT Time Calculation (min) (ACUTE ONLY): 25 min ? ? ?Charges:   PT Evaluation ?$PT Eval Moderate Complexity: 1 Mod ?PT Treatments ?$Therapeutic Activity: 8-22 mins ?  ?   ? ? ?Wyona Almas, PT, DPT ?Acute Rehabilitation Services ?Pager [phone] ?Office [phone] ? ? ?[name] ?01/02/2022, 4:59 PM ? ?

## 2022-01-03 DIAGNOSIS — I2119 ST elevation (STEMI) myocardial infarction involving other coronary artery of inferior wall: Secondary | ICD-10-CM | POA: Diagnosis not present

## 2022-01-03 LAB — CBC
HCT: 37.2 % (ref 36.0–46.0)
Hemoglobin: 12.1 g/dL (ref 12.0–15.0)
MCH: 28.3 pg (ref 26.0–34.0)
MCHC: 32.5 g/dL (ref 30.0–36.0)
MCV: 86.9 fL (ref 80.0–100.0)
Platelets: 142 10*3/uL — ABNORMAL LOW (ref 150–400)
RBC: 4.28 MIL/uL (ref 3.87–5.11)
RDW: 14.2 % (ref 11.5–15.5)
WBC: 14 10*3/uL — ABNORMAL HIGH (ref 4.0–10.5)
nRBC: 0 % (ref 0.0–0.2)

## 2022-01-03 LAB — BASIC METABOLIC PANEL
Anion gap: 9 (ref 5–15)
BUN: 29 mg/dL — ABNORMAL HIGH (ref 8–23)
CO2: 21 mmol/L — ABNORMAL LOW (ref 22–32)
Calcium: 8.5 mg/dL — ABNORMAL LOW (ref 8.9–10.3)
Chloride: 100 mmol/L (ref 98–111)
Creatinine, Ser: 1.26 mg/dL — ABNORMAL HIGH (ref 0.44–1.00)
GFR, Estimated: 42 mL/min — ABNORMAL LOW (ref >60)
Glucose, Bld: 104 mg/dL — ABNORMAL HIGH (ref 70–99)
Potassium: 4.4 mmol/L (ref 3.5–5.1)
Sodium: 130 mmol/L — ABNORMAL LOW (ref 135–145)

## 2022-01-03 MED ORDER — BISACODYL 10 MG RE SUPP
10.0000 mg | Freq: Every day | RECTAL | Status: DC | PRN
  Administered 2022-01-03 – 2022-01-05 (×2): 10 mg via RECTAL
  Filled 2022-01-03 (×2): qty 1

## 2022-01-03 NOTE — Progress Notes (Signed)
? ?Progress Note ? ?Patient Name: Kristy Patterson ?Date of Encounter: 01/03/2022 ? ?Primary Cardiologist: Andrez Grime, MD ? ?Subjective  ? ?Felt somewhat weak yesterday but did work with PT.  Complains of lower abdominal cramping.  States that she has IBS, also history of diverticulosis.  Had some constipation yesterday that improved with Dulcolax suppository. ? ?Inpatient Medications  ?  ?Scheduled Meds: ? aspirin EC  81 mg Oral Daily  ? atorvastatin  80 mg Oral Daily  ? Chlorhexidine Gluconate Cloth  6 each Topical Daily  ? clonazepam  0.5 mg Oral BID  ? clopidogrel  75 mg Oral Q breakfast  ? heparin  5,000 Units Subcutaneous Q8H  ? losartan  12.5 mg Oral Daily  ? metoprolol tartrate  25 mg Oral BID  ? sodium chloride flush  3 mL Intravenous Q12H  ? sodium chloride flush  3 mL Intravenous Q12H  ? ?Continuous Infusions: ? sodium chloride 10 mL/hr at 01/01/22 2000  ? sodium chloride 10 mL/hr at 01/01/22 0700  ? sodium chloride    ? nitroGLYCERIN Stopped (01/01/22 0239)  ? ?PRN Meds: ?sodium chloride, sodium chloride, sodium chloride, acetaminophen, alum & mag hydroxide-simeth, morphine injection, nitroGLYCERIN, ondansetron (ZOFRAN) IV, sodium chloride flush  ? ?Vital Signs  ?  ?Vitals:  ? 01/03/22 0500 01/03/22 0600 01/03/22 0700 01/03/22 0715  ?BP: (!) 113/57 (!) 96/55  127/63  ?Pulse:    91  ?Resp:    20  ?Temp:      ?TempSrc:      ?SpO2: 95% 95% 98% 95%  ?Weight:  81.4 kg    ?Height:      ? ? ?Intake/Output Summary (Last 24 hours) at 01/03/2022 0752 ?Last data filed at 01/03/2022 0700 ?Gross per 24 hour  ?Intake 530 ml  ?Output 650 ml  ?Net -120 ml  ? ?Filed Weights  ? 01/01/22 0723 01/02/22 0600 01/03/22 0600  ?Weight: 82 kg 81.6 kg 81.4 kg  ? ? ?Telemetry  ?  ?Sinus rhythm.  Personally reviewed. ? ?ECG  ?  ?An ECG dated 01/03/2022 was personally reviewed today and demonstrated:  Sinus rhythm with right bundle branch block and left anterior fascicular block, nonspecific ST-T changes. ? ?Physical Exam  ? ?GEN: No  acute distress.   ?Neck: No JVD. ?Cardiac: RRR without significant murmur or gallop.  ?Respiratory: Nonlabored.  Few rhonchi, no wheezing. ?GI: Soft, nontender, bowel sounds present.  No guarding. ?MS: No edema; No deformity. ?Neuro:  Nonfocal. ?Psych: Alert and oriented x 3. Normal affect. ? ?Labs  ?  ?Chemistry ?Recent Labs  ?Lab 12/30/21 ?2211 12/31/21 ?0429 01/01/22 ?[id number] 01/02/22 ?[id number] 01/03/22 ?0107  ?NA 129*   < > 130* 128* 130*  ?K 3.3*   < > 4.3 4.4 4.4  ?CL 94*   < > 98 99 100  ?CO2 23   < > 21* 20* 21*  ?GLUCOSE 178*   < > 123* 132* 104*  ?BUN 18   < > 16 20 29*  ?CREATININE 1.28*   < > 1.36* 1.15* 1.26*  ?CALCIUM 8.7*   < > 8.4* 8.3* 8.5*  ?PROT 7.3  --   --   --   --   ?ALBUMIN 3.7  --   --   --   --   ?AST 57*  --   --   --   --   ?ALT 18  --   --   --   --   ?ALKPHOS 104  --   --   --   --   ?  BILITOT 0.5  --   --   --   --   ?GFRNONAA 41*   < > 38* 47* 42*  ?ANIONGAP 12   < > 11 9 9   ? < > = values in this interval not displayed.  ?  ? ?Hematology ?Recent Labs  ?Lab 01/01/22 ?[id number] 01/02/22 ?[id number] 01/03/22 ?0107  ?WBC 12.8* 14.5* 14.0*  ?RBC 4.13 4.13 4.28  ?HGB 11.9* 12.0 12.1  ?HCT 36.6 36.0 37.2  ?MCV 88.6 87.2 86.9  ?MCH 28.8 29.1 28.3  ?MCHC 32.5 33.3 32.5  ?RDW 14.0 13.9 14.2  ?PLT 189 147* 142*  ? ? ?Cardiac Enzymes ?Recent Labs  ?Lab 12/30/21 ?2211 12/31/21 ?0429  ?TROPONINIHS 4,097* >24,000*  ? ? ?BNP ?Recent Labs  ?Lab 12/31/21 ?0429  ?BNP 326.2*  ?  ? ?Radiology  ?  ?CARDIAC CATHETERIZATION ? ?Result Date: 01/01/2022 ?  Mid LM to Ost LAD lesion is 80% stenosed.  After orbital atherectomy, a drug-eluting stent was successfully placed using a SYNERGY XD 3.50X16, postdilated to 4 mm.  Optimized with IVUS.   Post intervention, there is a 0% residual stenosis.   Prox LAD lesion is 80% stenosed.  After orbital atherectomy, a drug-eluting stent was successfully placed using a SYNERGY XD 3.0X16; proximal stent postdilated to 4.0 mm.  Optimized with IVUS.   Post intervention, there is a 0% residual  stenosis.   Mid Circumflex stent was patent.   Prox RCA lesion is 70% stenosed noted from prior cath.   LV end diastolic pressure is moderately elevated.   There is no aortic valve stenosis. Successful orbital atherectomy and stenting of the distal left main, ostial LAD and mid LAD.  Continue dual antiplatelet therapy for at least 12 months.  Would suggest clopidogrel monotherapy beyond that time.  Continue aggressive secondary prevention.  She still has a right coronary artery lesion that can be addressed at a later time.  LVEDP was elevated.  She will need diuretics.  Heparin stopped.  Patient was hemodynamically stable throughout.  Hopeful to remove intra-aortic balloon pump later today.   ? ?Assessment & Plan  ?  ?1.  Inferior STEMI status post DES to mid circumflex on May 3 at [facility]. ? ?2.  Multivessel/left main CAD status post orbital atherectomy with DES to the distal left main and ostial LAD on May 5 at [facility]  Residual disease includes proximal 70% RCA stenosis to be managed medically for now.  IABP removed yesterday.  LVEF 50 to 55%. ? ?3.  Hyperlipidemia with recent LDL 162.  Started on Lipitor 80 mg daily. ? ?4.  CKD stage IIIb, follow-up creatinine up to 1.26 from 1.15. ? ?5.  History of IBS and reported diverticulosis.  She follows with Dr. Haig Prophet, gastroenterology in [place].  She was planned to undergo a contrasted abdominal pelvic CT as an outpatient however this never occurred given her presentation with ACS.  She has had intermittent lower abdominal cramping and constipation ? ?Discussed with patient and nursing.  Plan to stop Cozaar which was initiated yesterday, will hold off until her blood pressure is further stabilized and she is more mobile.  PT evaluation yesterday recommended home health.  I know that her daughter had some concerns about getting her into an assisted living facility in [place].  Case manager was consulted.  Continue aspirin, Lipitor, Plavix, and Lopressor.   Recheck BMET tomorrow.  Would probably consider abdominal and pelvic CT while she is here before discharge presuming renal function remains stable. ? ?  Signed, ?Rozann Lesches, MD  ?01/03/2022, 7:52 AM    ?

## 2022-01-03 NOTE — Progress Notes (Deleted)
5/6 2300 Pt. arterial line not functional. RN performed troubleshoot at bedside with RT. No waveform present, no blood drawback and waveform no longer correlating with cuff BP. E-link contacted and made aware as this nurse was actively titrating levo and tightly monitoring BP. New orders prescribed. RT to place new arterial line.  ?  ?5/7 0030 RT attempted to place arterial line, ultimately unable to line pt. E-link updated and made aware. RN to continue monitoring pt. BP based on cuff pressures. Advised to call e-link if up-titrating on levo or if cuff pressures read unsustainably low.  ?Levo maintained @10, Vaso @ .04 ? ?

## 2022-01-03 NOTE — Evaluation (Addendum)
Occupational Therapy Evaluation ?Patient Details ?Name: Kristy CosierJoan Patterson ?MRN: [id number] ?DOB: 03/29/1936 ?Today's Date: 01/03/2022 ? ? ?History of Present Illness Pt is a 86 y.o. F who presents 12/31/2021 with STEMI. Found to have hazy 99 % thrombotic hazy lesion in the LCx thought to be the culprit lesion.  With guide catheter found to have 80% distal LMCA, 90% ostial LAD, 80% mid LAD lesion as well as symptoms in RCA in addition to the culprit lesion. S/p PCI of LCx. Significant PMH: HTN, CKD, diverticulitis, recurrent UTIs.  ? ?Clinical Impression ?  ?This 86 yo female admitted and underwent above presents to acute OT with PLOF of being totally independent with all basic ADLs, IADLs, and driving. Currently she is at a setup -min guard A level for basic ADLs. She will continue to benefit from acute OT with follow up HHOT and staying with her daughter.  ?   ? ?Recommendations for follow up therapy are one component of a multi-disciplinary discharge planning process, led by the attending physician.  Recommendations may be updated based on patient status, additional functional criteria and insurance authorization.  ? ?Follow Up Recommendations ? Home health OT  ?  ?Assistance Recommended at Discharge Frequent or constant Supervision/Assistance  ?Patient can return home with the following A little help with walking and/or transfers;A little help with bathing/dressing/bathroom;Assist for transportation;Assistance with cooking/housework ? ?  ?Functional Status Assessment ? Patient has had a recent decline in their functional status and demonstrates the ability to make significant improvements in function in a reasonable and predictable amount of time.  ?Equipment Recommendations ? BSC/3in1  ?  ?   ?Precautions / Restrictions Precautions ?Precautions: Fall  ? ?  ? ?Mobility Bed Mobility ?  ?  ?  ?  ?  ?  ?  ?General bed mobility comments: OOB in chair ?  ? ?Transfers ?Overall transfer level: Needs assistance ?Equipment used:  Rolling walker (2 wheels) ?Transfers: Sit to/from Stand ?Sit to Stand: Min guard ?  ?  ?  ?  ?  ?General transfer comment: cues for hand placement ?  ? ?  ?Balance Overall balance assessment: Mild deficits observed, not formally tested ?  ?  ?  ?  ?  ?  ?  ?  ?  ?  ?  ?  ?  ?  ?  ?  ?  ?  ?   ? ?ADL either performed or assessed with clinical judgement  ? ?ADL Overall ADL's : Needs assistance/impaired ?Eating/Feeding: Independent;Sitting ?  ?Grooming: Min guard;Standing ?  ?Upper Body Bathing: Set up;Sitting ?  ?Lower Body Bathing: Min guard;Sit to/from stand ?  ?Upper Body Dressing : Set up;Sitting ?  ?Lower Body Dressing: Min guard;Sit to/from stand ?  ?Toilet Transfer: Min guard;Ambulation;Rolling walker (2 wheels) ?Toilet Transfer Details (indicate cue type and reason): simulated recliner>walking in hallway with RW>standing at sink to brush teeth>return to sit in recliner ?Toileting- ArchitectClothing Manipulation and Hygiene: Min guard;Sit to/from stand ?  ?  ?  ?  ?   ? ? ? ?Vision Baseline Vision/History: 1 Wears glasses ?Patient Visual Report: No change from baseline ?   ?   ?   ?   ? ?Pertinent Vitals/Pain Pain Assessment ?Pain Assessment: Faces ?Faces Pain Scale: Hurts a little bit ?Pain Location: left groin with crossing legs ?Pain Descriptors / Indicators: Sore, Tightness ?Pain Intervention(s): Limited activity within patient's tolerance, Monitored during session, Repositioned  ? ? ? ?Hand Dominance Right ?  ?Extremity/Trunk Assessment Upper Extremity Assessment ?  Upper Extremity Assessment: Overall WFL for tasks assessed ?  ?  ?  ?  ?  ?Communication Communication ?Communication: No difficulties ?  ?Cognition Arousal/Alertness: Awake/alert ?Behavior During Therapy: [facility] for tasks assessed/performed ?Overall Cognitive Status: Within Functional Limits for tasks assessed ?  ?  ?  ?  ?  ?  ?  ?  ?  ?  ?  ?  ?  ?  ?  ?  ?  ?  ?  ?General Comments  In standing and turning with RW she lifted one hand off of RW to adjust  her glasses and had a slight sway/LOB where she said "whoa" ? ?  ?   ?   ? ? ?Home Living Family/patient expects to be discharged to:: Private residence ?Living Arrangements: Alone ?Available Help at Discharge: Family;Available PRN/intermittently ?Type of Home: Apartment ?Home Access: Level entry ?  ?  ?Home Layout: One level ?  ?  ?Bathroom Shower/Tub: Walk-in shower ?  ?Bathroom Toilet: Standard ?Bathroom Accessibility: Yes ?  ?Home Equipment: Agricultural consultant (2 wheels) ?  ?Additional Comments: Plan is to D/C home with dtr initially then back home to her house ?  ? ?  ?Prior Functioning/Environment Prior Level of Function : Independent/Modified Independent;Driving ?  ?  ?  ?  ?  ?  ?Mobility Comments: Hx of balance difficulty and vertigo. Denies falls ?ADLs Comments: independent ADL's/IADL's ?  ? ?  ?  ?OT Problem List: Decreased range of motion;Impaired balance (sitting and/or standing) ?  ?   ?OT Treatment/Interventions: Self-care/ADL training;DME and/or AE instruction;Patient/family education;Balance training  ?  ?OT Goals(Current goals can be found in the care plan section) Acute Rehab OT Goals ?Patient Stated Goal: to be able to return to her apartment ?OT Goal Formulation: With patient ?Time For Goal Achievement: 01/17/22 ?Potential to Achieve Goals: Good ?ADL Goals ?Pt Will Perform Grooming: Independently;standing ?Pt Will Perform Upper Body Bathing: Independently;sitting ?Pt Will Perform Lower Body Bathing: with modified independence;with adaptive equipment;sit to/from stand ?Pt Will Perform Upper Body Dressing: Independently;sitting ?Pt Will Perform Lower Body Dressing: with modified independence;sit to/from stand;with adaptive equipment ?Pt Will Transfer to Toilet: with modified independence;ambulating;bedside commode (over toilet) ?Pt Will Perform Toileting - Clothing Manipulation and hygiene: with modified independence;sit to/from stand ?Additional ADL Goal #1: Pt will be Mod I in and OOB for basic ADLs  (increased time)  ?OT Frequency: Min 2X/week ?  ? ?   ?AM-PAC OT "6 Clicks" Daily Activity     ?Outcome Measure Help from another person eating meals?: None ?Help from another person taking care of personal grooming?: A Little ?Help from another person toileting, which includes using toliet, bedpan, or urinal?: A Little ?Help from another person bathing (including washing, rinsing, drying)?: A Little ?Help from another person to put on and taking off regular upper body clothing?: A Little ?Help from another person to put on and taking off regular lower body clothing?: A Little ?6 Click Score: 19 ?  ?End of Session Equipment Utilized During Treatment: Rolling walker (2 wheels) ? ?Activity Tolerance: Patient tolerated treatment well ?Patient left: in chair;with call bell/phone within reach ? ?OT Visit Diagnosis: Other abnormalities of gait and mobility (R26.89);Unsteadiness on feet (R26.81);Pain ?Pain - Right/Left: Left ?Pain - part of body:  (groin with crossing legs)  ?              ?Time: 4944-9675 ?OT Time Calculation (min): 27 min ?Charges:  OT General Charges ?$OT Visit: 1 Visit ?OT Evaluation ?$OT Eval  Moderate Complexity: 1 Mod ?OT Treatments ?$Self Care/Home Management : 8-22 mins ? ?Ignacia Palma, OTR/L ?Acute Rehab Services ?Pager [phone] ?Office [phone] ? ? ? ?Evette Georges ?01/03/2022, 3:27 PM ?

## 2022-01-04 ENCOUNTER — Encounter (HOSPITAL_COMMUNITY): Payer: Self-pay | Admitting: Interventional Cardiology

## 2022-01-04 DIAGNOSIS — N1832 Chronic kidney disease, stage 3b: Secondary | ICD-10-CM

## 2022-01-04 DIAGNOSIS — E785 Hyperlipidemia, unspecified: Secondary | ICD-10-CM | POA: Diagnosis not present

## 2022-01-04 DIAGNOSIS — R1030 Lower abdominal pain, unspecified: Secondary | ICD-10-CM

## 2022-01-04 DIAGNOSIS — K5909 Other constipation: Secondary | ICD-10-CM

## 2022-01-04 DIAGNOSIS — I25110 Atherosclerotic heart disease of native coronary artery with unstable angina pectoris: Secondary | ICD-10-CM | POA: Diagnosis not present

## 2022-01-04 LAB — BASIC METABOLIC PANEL
Anion gap: 9 (ref 5–15)
BUN: 32 mg/dL — ABNORMAL HIGH (ref 8–23)
CO2: 20 mmol/L — ABNORMAL LOW (ref 22–32)
Calcium: 8.6 mg/dL — ABNORMAL LOW (ref 8.9–10.3)
Chloride: 103 mmol/L (ref 98–111)
Creatinine, Ser: 1.45 mg/dL — ABNORMAL HIGH (ref 0.44–1.00)
GFR, Estimated: 36 mL/min — ABNORMAL LOW (ref >60)
Glucose, Bld: 97 mg/dL (ref 70–99)
Potassium: 4.5 mmol/L (ref 3.5–5.1)
Sodium: 132 mmol/L — ABNORMAL LOW (ref 135–145)

## 2022-01-04 LAB — CBC
HCT: 39.4 % (ref 36.0–46.0)
Hemoglobin: 12.6 g/dL (ref 12.0–15.0)
MCH: 28.4 pg (ref 26.0–34.0)
MCHC: 32 g/dL (ref 30.0–36.0)
MCV: 88.9 fL (ref 80.0–100.0)
Platelets: 215 10*3/uL (ref 150–400)
RBC: 4.43 MIL/uL (ref 3.87–5.11)
RDW: 14.4 % (ref 11.5–15.5)
WBC: 10 10*3/uL (ref 4.0–10.5)
nRBC: 0 % (ref 0.0–0.2)

## 2022-01-04 MED ORDER — LEVOTHYROXINE SODIUM 25 MCG PO TABS
125.0000 ug | ORAL_TABLET | Freq: Every day | ORAL | Status: DC
  Administered 2022-01-04 – 2022-01-08 (×4): 125 ug via ORAL
  Filled 2022-01-04 (×5): qty 1

## 2022-01-04 MED ORDER — MAGNESIUM HYDROXIDE 400 MG/5ML PO SUSP
30.0000 mL | Freq: Once | ORAL | Status: DC
  Filled 2022-01-04: qty 30

## 2022-01-04 MED ORDER — BISACODYL 10 MG RE SUPP
10.0000 mg | Freq: Once | RECTAL | Status: DC
  Filled 2022-01-04: qty 1

## 2022-01-04 NOTE — Progress Notes (Signed)
CARDIAC REHAB PHASE I  ? ?Pt in BR with NT then to recliner, generally fatigued. Declined ambulation for now. Discussed with pt and daughter NTG and CRPII. Will refer to [facility].  ?[id number] ? ?Ethelda Chick CES, ACSM ?01/04/2022 ?2:35 PM ? ? ? ? ?

## 2022-01-04 NOTE — Progress Notes (Signed)
Physical Therapy Treatment ?Patient Details ?Name: Kristy Patterson ?MRN: [id number] ?DOB: Jun 22, 1936 ?Today's Date: 01/04/2022 ? ? ?History of Present Illness 86 y.o. F who presents 12/31/2021 with STEMI. Found to have hazy 99 % thrombotic hazy lesion in the LCx thought to be the culprit lesion.  With guide catheter found to have 80% distal LMCA, 90% ostial LAD, 80% mid LAD lesion as well as symptoms in RCA in addition to the culprit lesion. S/p PCI of LCx. Significant PMH: HTN, CKD, diverticulitis, recurrent UTIs. ? ?  ?PT Comments  ? ? Pt progressing well towards her physical therapy goals. Focus on gait training with Rollator vs RW. Pt with preference for RW for increased stability currently. Pt ambulating 100 ft at a supervision level. Worked on lateral stepping with varying hand support for strengthening/balance. Pt continues with decreased cardiopulmonary endurance,  balance deficits, and weakness. Will benefit from follow up [place].  ?  ?Recommendations for follow up therapy are one component of a multi-disciplinary discharge planning process, led by the attending physician.  Recommendations may be updated based on patient status, additional functional criteria and insurance authorization. ? ?Follow Up Recommendations ? Home health PT ?  ?  ?Assistance Recommended at Discharge PRN  ?Patient can return home with the following A little help with walking and/or transfers;A little help with bathing/dressing/bathroom;Assistance with cooking/housework;Assist for transportation;Help with stairs or ramp for entrance ?  ?Equipment Recommendations ? Rolling walker (2 wheels)  ?  ?Recommendations for Other Services   ? ? ?  ?Precautions / Restrictions Precautions ?Precautions: Fall ?Restrictions ?Weight Bearing Restrictions: No  ?  ? ?Mobility ? Bed Mobility ?  ?  ?  ?  ?  ?  ?  ?  ?  ? ?Transfers ?  ?  ?  ?Sit to Stand: Supervision ?  ?  ?  ?  ?  ?  ?  ? ?Ambulation/Gait ?Ambulation/Gait assistance: Supervision ?Gait Distance  (Feet): 100 Feet ?Assistive device: Rolling walker (2 wheels), Rollator (4 wheels) ?Gait Pattern/deviations: Step-through pattern, Decreased stride length ?Gait velocity: decreased ?  ?  ?General Gait Details: Trialed Rollator first ~50 ft, then RW second 50 ft. Pt with preference for RW for increased stability, demonstrates increased gait speed. Cues for walker proximity ? ? ?Stairs ?  ?  ?  ?  ?  ? ? ?Wheelchair Mobility ?  ? ?Modified Rankin (Stroke Patients Only) ?  ? ? ?  ?Balance   ?  ?  ?  ?  ?  ?  ?  ?  ?  ?  ?  ?  ?  ?  ?  ?  ?  ?  ?  ? ?  ?Cognition   ?  ?  ?  ?  ?  ?  ?  ?  ?  ?  ?  ?  ?  ?  ?  ?  ?  ?  ?  ?  ?  ? ?  ?Exercises Other Exercises ?Other Exercises: Lateral stepping to R/L progressing to BUE support, to single UE to no UE ? ?  ?General Comments General comments (skin integrity, edema, etc.): Daughter present throughout ?  ?  ? ?Pertinent Vitals/Pain    ? ? ?Home Living   ?  ?  ?  ?  ?  ?  ?  ?  ?  ?   ?  ?Prior Function    ?  ?  ?   ? ?PT Goals (current goals can now  be found in the care plan section) Acute Rehab PT Goals ?Patient Stated Goal: back to independence ?Potential to Achieve Goals: Good ?Progress towards PT goals: Progressing toward goals ? ?  ?Frequency ? ? ? Min 3X/week ? ? ? ?  ?PT Plan Current plan remains appropriate  ? ? ?Co-evaluation   ?  ?  ?  ?  ? ?  ?AM-PAC PT "6 Clicks" Mobility   ?Outcome Measure ? Help needed turning from your back to your side while in a flat bed without using bedrails?: None ?Help needed moving from lying on your back to sitting on the side of a flat bed without using bedrails?: A Little ?Help needed moving to and from a bed to a chair (including a wheelchair)?: A Little ?Help needed standing up from a chair using your arms (e.g., wheelchair or bedside chair)?: A Little ?Help needed to walk in hospital room?: A Little ?Help needed climbing 3-5 steps with a railing? : A Little ?6 Click Score: 19 ? ?  ?End of Session Equipment Utilized During  Treatment: Gait belt ?Activity Tolerance: Patient tolerated treatment well ?Patient left: in chair;with call bell/phone within reach ?Nurse Communication: Mobility status ?PT Visit Diagnosis: Unsteadiness on feet (R26.81);Difficulty in walking, not elsewhere classified (R26.2) ?  ? ? ?Time: 1032-1100 ?PT Time Calculation (min) (ACUTE ONLY): 28 min ? ?Charges:  $Gait Training: 8-22 mins ?$Therapeutic Activity: 8-22 mins          ?          ? ?Kristy Patterson, PT, DPT ?Acute Rehabilitation Services ?Pager [phone] ?Office [phone] ? ? ? ?Kristy Patterson ?01/04/2022, 12:57 PM ? ?

## 2022-01-04 NOTE — Progress Notes (Signed)
Occupational Therapy Treatment ?Patient Details ?Name: Kristy Patterson ?MRN: [id number] ?DOB: [birth year] ?Today's Date: 01/04/2022 ? ? ?History of present illness 86 y.o. F who presents 12/31/2021 with STEMI. Found to have hazy 99 % thrombotic hazy lesion in the LCx thought to be the culprit lesion.  With guide catheter found to have 80% distal LMCA, 90% ostial LAD, 80% mid LAD lesion as well as symptoms in RCA in addition to the culprit lesion. S/p PCI of LCx. Significant PMH: HTN, CKD, diverticulitis, recurrent UTIs. ?  ?OT comments ? Pt progressing towards established OT goals. Providing education on use of AE for LB ADLs. Pt demonstrating understanding for donning/doffing socks. Pt verbalized understanding for donning pants, shoes, and toilet hygiene with AE. Continue to recommend dc to home with HHOT and will continue to follow acutely as admitted.   ? ?Recommendations for follow up therapy are one component of a multi-disciplinary discharge planning process, led by the attending physician.  Recommendations may be updated based on patient status, additional functional criteria and insurance authorization. ?   ?Follow Up Recommendations ? Home health OT  ?  ?Assistance Recommended at Discharge Frequent or constant Supervision/Assistance  ?Patient can return home with the following ? A little help with walking and/or transfers;A little help with bathing/dressing/bathroom;Assist for transportation;Assistance with cooking/housework ?  ?Equipment Recommendations ? BSC/3in1  ?  ?Recommendations for Other Services   ? ?  ?Precautions / Restrictions Precautions ?Precautions: Fall ?Restrictions ?Weight Bearing Restrictions: No  ? ? ?  ? ?Mobility Bed Mobility ?  ?  ?  ?  ?  ?  ?  ?General bed mobility comments: OOB in chair ?  ? ?Transfers ?Overall transfer level: Needs assistance ?Equipment used: Rolling walker (2 wheels) ?Transfers: Sit to/from Stand ?Sit to Stand: Min guard ?  ?  ?  ?  ?  ?General transfer comment: Min Guard A  for safety ?  ?  ?Balance Overall balance assessment: Mild deficits observed, not formally tested ?  ?  ?  ?  ?  ?  ?  ?  ?  ?  ?  ?  ?  ?  ?  ?  ?  ?  ?   ? ?ADL either performed or assessed with clinical judgement  ? ?ADL Overall ADL's : Needs assistance/impaired ?  ?  ?  ?  ?  ?  ?  ?Lower Body Bathing Details (indicate cue type and reason): Education on use of long handled sponge for LB bathing ?  ?  ?Lower Body Dressing: Supervision/safety;Set up;Sit to/from stand;Cueing for compensatory techniques;Cueing for sequencing;With adaptive equipment ?Lower Body Dressing Details (indicate cue type and reason): Providing education on use of reacher, sock aid, and shoe horn for LB dressing. Pt donnig/doffing socks with AE. verbalizing understanding of donning pants with reacher. ?  ?  ?  ?Toileting - Clothing Manipulation Details (indicate cue type and reason): Providing education on AE ?  ?  ?  ?General ADL Comments: Providing education on use of AE for LB ADLs and toileting. ?  ? ?Extremity/Trunk Assessment Upper Extremity Assessment ?Upper Extremity Assessment: Overall WFL for tasks assessed ?  ?Lower Extremity Assessment ?Lower Extremity Assessment: Defer to PT evaluation ?RLE Deficits / Details: Grossly 4/5 ?LLE Deficits / Details: Grossly 4/5 ?  ?  ?  ? ?Vision   ?  ?  ?Perception   ?  ?Praxis   ?  ? ?Cognition Arousal/Alertness: Awake/alert ?Behavior During Therapy: [facility] for tasks assessed/performed ?Overall Cognitive Status: Within  Functional Limits for tasks assessed ?  ?  ?  ?  ?  ?  ?  ?  ?  ?  ?  ?  ?  ?  ?  ?  ?  ?  ?  ?   ?Exercises   ? ?  ?Shoulder Instructions   ? ? ?  ?General Comments Daughter present throughout  ? ? ?Pertinent Vitals/ Pain       Pain Assessment ?Pain Assessment: Faces ?Faces Pain Scale: Hurts a little bit ?Pain Location: left groin with crossing legs ?Pain Descriptors / Indicators: Sore, Tightness ?Pain Intervention(s): Monitored during session, Limited activity within patient's  tolerance, Repositioned ? ?Home Living   ?  ?  ?  ?  ?  ?  ?  ?  ?  ?  ?  ?  ?  ?  ?  ?  ?  ?  ? ?  ?Prior Functioning/Environment    ?  ?  ?  ?   ? ?Frequency ? Min 2X/week  ? ? ? ? ?  ?Progress Toward Goals ? ?OT Goals(current goals can now be found in the care plan section) ? Progress towards OT goals: Progressing toward goals ? ?Acute Rehab OT Goals ?OT Goal Formulation: With patient ?Time For Goal Achievement: 01/17/22 ?Potential to Achieve Goals: Good ?ADL Goals ?Pt Will Perform Grooming: Independently;standing ?Pt Will Perform Upper Body Bathing: Independently;sitting ?Pt Will Perform Lower Body Bathing: with modified independence;with adaptive equipment;sit to/from stand ?Pt Will Perform Upper Body Dressing: Independently;sitting ?Pt Will Perform Lower Body Dressing: with modified independence;sit to/from stand;with adaptive equipment ?Pt Will Transfer to Toilet: with modified independence;ambulating;bedside commode (over toilet) ?Pt Will Perform Toileting - Clothing Manipulation and hygiene: with modified independence;sit to/from stand ?Additional ADL Goal #1: Pt will be Mod I in and OOB for basic ADLs (increased time)  ?Plan Discharge plan remains appropriate   ? ?Co-evaluation ? ? ?   ?  ?  ?  ?  ? ?  ?AM-PAC OT "6 Clicks" Daily Activity     ?Outcome Measure ? ? Help from another person eating meals?: None ?Help from another person taking care of personal grooming?: A Little ?Help from another person toileting, which includes using toliet, bedpan, or urinal?: A Little ?Help from another person bathing (including washing, rinsing, drying)?: A Little ?Help from another person to put on and taking off regular upper body clothing?: A Little ?Help from another person to put on and taking off regular lower body clothing?: A Little ?6 Click Score: 19 ? ?  ?End of Session Equipment Utilized During Treatment: Rolling walker (2 wheels);Other (comment) (AE) ? ?OT Visit Diagnosis: Other abnormalities of gait and  mobility (R26.89);Unsteadiness on feet (R26.81);Pain ?Pain - Right/Left: Left ?Pain - part of body:  (groin with crossing legs) ?  ?Activity Tolerance Patient tolerated treatment well ?  ?Patient Left in chair;with call bell/phone within reach;with family/visitor present ?  ?Nurse Communication Mobility status ?  ? ?   ? ?Time: 5329-9242 ?OT Time Calculation (min): 23 min ? ?Charges: OT General Charges ?$OT Visit: 1 Visit ?OT Treatments ?$Self Care/Home Management : 23-37 mins ? ?Montague Corella MSOT, OTR/L ?Acute Rehab ?Pager: [phone] ?Office: [phone] ? ?Rudi Bunyard M Allysen Lazo ?01/04/2022, 12:18 PM ? ? ?
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
## 2022-01-04 NOTE — Progress Notes (Signed)
Pt just ambulated with PT/OT and is exhausted. Discussed with pt and daughter MI, stents, Plavix, diet, and exercise. Receptive. Will f/u for ambulation. ?1130-1205 ?Ethelda Chick CES, ACSM ?12:05 PM ?01/04/2022 ? ?

## 2022-01-04 NOTE — Progress Notes (Signed)
? ?Progress Note ? ?Patient Name: Kristy Patterson ?Date of Encounter: 01/04/2022 ? ?Primary Cardiologist: Andrez Grime, MD ? ?Subjective  ? ?Her only complaint is abdominal pain. She feels that this is related to her diverticulitis. No chest pain or dyspnea ? ?Inpatient Medications  ?  ?Scheduled Meds: ? aspirin EC  81 mg Oral Daily  ? atorvastatin  80 mg Oral Daily  ? Chlorhexidine Gluconate Cloth  6 each Topical Daily  ? clonazepam  0.5 mg Oral BID  ? clopidogrel  75 mg Oral Q breakfast  ? heparin  5,000 Units Subcutaneous Q8H  ? metoprolol tartrate  25 mg Oral BID  ? sodium chloride flush  3 mL Intravenous Q12H  ? sodium chloride flush  3 mL Intravenous Q12H  ? ?Continuous Infusions: ? sodium chloride 10 mL/hr at 01/01/22 2000  ? sodium chloride 10 mL/hr at 01/01/22 0700  ? sodium chloride    ? nitroGLYCERIN Stopped (01/01/22 0239)  ? ?PRN Meds: ?sodium chloride, sodium chloride, sodium chloride, acetaminophen, alum & mag hydroxide-simeth, bisacodyl, morphine injection, nitroGLYCERIN, ondansetron (ZOFRAN) IV, sodium chloride flush  ? ?Vital Signs  ?  ?Vitals:  ? 01/04/22 0400 01/04/22 0500 01/04/22 0600 01/04/22 0700  ?BP: 138/61 128/64 (!) 124/56 (!) 133/52  ?Pulse:      ?Resp:      ?Temp: 97.7 ?F (36.5 ?C)     ?TempSrc: Oral     ?SpO2: 96% 96% 91% 95%  ?Weight:      ?Height:      ? ? ?Intake/Output Summary (Last 24 hours) at 01/04/2022 0709 ?Last data filed at 01/04/2022 0400 ?Gross per 24 hour  ?Intake 473 ml  ?Output 350 ml  ?Net 123 ml  ? ?Filed Weights  ? 01/01/22 0723 01/02/22 0600 01/03/22 0600  ?Weight: 82 kg 81.6 kg 81.4 kg  ? ? ?Telemetry  ?  ?sinus.  Personally reviewed. ? ?ECG  ?  ?No AM EKG ? ?Physical Exam  ? ? ?General: Well developed, well nourished, NAD  ?HEENT: OP clear, mucus membranes moist  ?SKIN: warm, dry. No rashes. ?Neuro: No focal deficits  ?Musculoskeletal: Muscle strength 5/5 all ext  ?Psychiatric: Mood and affect normal  ?Neck: No JVD ?Lungs:Clear bilaterally, no wheezes, rhonci,  crackles ?Cardiovascular: Regular rate and rhythm. No murmurs, gallops or rubs. ?Abdomen:Soft. Bowel sounds present.  ?Extremities: No lower extremity edema. ? ? ?Labs  ?  ?Chemistry ?Recent Labs  ?Lab 12/30/21 ?2211 12/31/21 ?0429 01/02/22 ?[id number] 01/03/22 ?0107 01/04/22 ?0031  ?NA 129*   < > 128* 130* 132*  ?K 3.3*   < > 4.4 4.4 4.5  ?CL 94*   < > 99 100 103  ?CO2 23   < > 20* 21* 20*  ?GLUCOSE 178*   < > 132* 104* 97  ?BUN 18   < > 20 29* 32*  ?CREATININE 1.28*   < > 1.15* 1.26* 1.45*  ?CALCIUM 8.7*   < > 8.3* 8.5* 8.6*  ?PROT 7.3  --   --   --   --   ?ALBUMIN 3.7  --   --   --   --   ?AST 57*  --   --   --   --   ?ALT 18  --   --   --   --   ?ALKPHOS 104  --   --   --   --   ?BILITOT 0.5  --   --   --   --   ?GFRNONAA 41*   < >  47* 42* 36*  ?ANIONGAP 12   < > 9 9 9   ? < > = values in this interval not displayed.  ?  ? ?Hematology ?Recent Labs  ?Lab 01/02/22 ?[id number] 01/03/22 ?0107 01/04/22 ?0031  ?WBC 14.5* 14.0* 10.0  ?RBC 4.13 4.28 4.43  ?HGB 12.0 12.1 12.6  ?HCT 36.0 37.2 39.4  ?MCV 87.2 86.9 88.9  ?MCH 29.1 28.3 28.4  ?MCHC 33.3 32.5 32.0  ?RDW 13.9 14.2 14.4  ?PLT 147* 142* 215  ? ? ?Cardiac Enzymes ?Recent Labs  ?Lab 12/30/21 ?2211 12/31/21 ?0429  ?TROPONINIHS 4,097* >24,000*  ? ? ?BNP ?Recent Labs  ?Lab 12/31/21 ?0429  ?BNP 326.2*  ?  ? ?Radiology  ?  ?No results found. ? ?Assessment & Plan  ?  ?1.  CAD/Inferior STEMI: She was admitted to [facility] with an inferior STEMI on 12/30/21. The culprit vessel was the Circumflex which was treated with a drug eluting stent. She was transferred to [facility] and had orbital atherectomy of the distal left main and ostial LAD with stenting on 01/01/22. She has a residual moderate RCA stenosis that will be treated medically.  ?- No chest pain today ?-Continue ASA, Plavix, statin and beta blocker. . ? ?2. Hyperlipidemia: She has been started on a high intensity statin.  ? ?3. CKD stage IIIb: Creatinine up to 1.4 today. Will follow ? ?4. History of IBS and reported diverticulosis:  She  follows with Dr. Haig Prophet, gastroenterology in [place].  She was planned to undergo a contrasted abdominal pelvic CT as an outpatient however this never occurred given her presentation with ACS.  She has had intermittent lower abdominal cramping and constipation. She is now complaining of moderately severe lower abdominal pain. -I will ask our GI team to see her today. Consider contrasted CT of the abd/pelvis before discharge if renal function normalizes. Given bump in her creatinine today, I would not do this today ? ?5. Dispo: Home health per PT. Her daughter would like for her to go to assisted living. Case management has been consulted.  ? ?Will transfer to telemetry today.  ? ? ?Signed, ?Lauree Chandler, MD  ?01/04/2022, 7:09 AM    ?

## 2022-01-05 DIAGNOSIS — I2119 ST elevation (STEMI) myocardial infarction involving other coronary artery of inferior wall: Secondary | ICD-10-CM | POA: Diagnosis not present

## 2022-01-05 DIAGNOSIS — I5032 Chronic diastolic (congestive) heart failure: Secondary | ICD-10-CM

## 2022-01-05 DIAGNOSIS — I5031 Acute diastolic (congestive) heart failure: Secondary | ICD-10-CM

## 2022-01-05 LAB — BASIC METABOLIC PANEL
Anion gap: 5 (ref 5–15)
BUN: 23 mg/dL (ref 8–23)
CO2: 24 mmol/L (ref 22–32)
Calcium: 8.8 mg/dL — ABNORMAL LOW (ref 8.9–10.3)
Chloride: 105 mmol/L (ref 98–111)
Creatinine, Ser: 1.33 mg/dL — ABNORMAL HIGH (ref 0.44–1.00)
GFR, Estimated: 39 mL/min — ABNORMAL LOW (ref >60)
Glucose, Bld: 114 mg/dL — ABNORMAL HIGH (ref 70–99)
Potassium: 4 mmol/L (ref 3.5–5.1)
Sodium: 134 mmol/L — ABNORMAL LOW (ref 135–145)

## 2022-01-05 MED ORDER — FUROSEMIDE 40 MG PO TABS
40.0000 mg | ORAL_TABLET | Freq: Every day | ORAL | Status: DC
  Administered 2022-01-05 – 2022-01-08 (×4): 40 mg via ORAL
  Filled 2022-01-05 (×4): qty 1

## 2022-01-05 NOTE — Care Management Important Message (Signed)
Important Message ? ?Patient Details  ?Name: Kristy Patterson ?MRN: [id number] ?Date of Birth: 1936-01-30 ? ? ?Medicare Important Message Given:  Yes ? ? ? ? ?Renie Ora ?01/05/2022, 8:08 AM ?

## 2022-01-05 NOTE — Discharge Summary (Addendum)
?Discharge Summary  ?  ?Patient ID: Kristy Patterson ?MRN: [id number]; DOB: 24-May-1936 ? ?Admit date: 12/31/2021 ?Discharge date: 01/08/2022 ? ?PCP:  Leonel Ramsay, MD ?  ?[facility] Prov[facility] ?Cardiologist:  New to [facility]. She would like to follow-up with our group in [place]. ? ?Discharge Diagnoses  ?  ?Principal Problem: ?  Acute ST elevation myocardial infarction (STEMI) of inferior wall (HCC) ?Active Problems: ?  CAD (coronary artery disease) ?  Essential hypertension ?  Hyperlipidemia with target LDL less than 70 ?  CKD (chronic kidney disease), stage III ([place]) ?  Hypothyroidism ?  Irritable bowel syndrome with constipation ?  Diverticulosis of colon ?  Acute diastolic heart failure ([place]) ?  Distended bladder ?  Hydroureteronephrosis ?  Urinary retention ? ? ? ?Diagnostic Studies/Procedures  ?  ?Left Cardiac Catheterization 12/30/2021: ?  Mid LM lesion is 80% stenosed. ?  Ost LAD lesion is 95% stenosed. ?  Prox LAD lesion is 80% stenosed. ?  Prox Cx to Mid Cx lesion is 99% stenosed. ?  Prox RCA lesion is 70% stenosed. ?  A drug-eluting stent was successfully placed using a Greenleaf 2.75X22. ?  Post intervention, there is a 0% residual stenosis. ?  The left ventricular systolic function is normal. ?  LV end diastolic pressure is normal. ?  There is mild aortic valve stenosis. ?  ?Conclusion: ?Severe three-vessel coronary artery disease including 80% distal LMCA, 95% ostial LAD, 80% mid LAD, 70% RCA. ?Culprit lesion is 99% thrombotic occlusion of the mid left circumflex ?Elevated LVEDP of approximately 30 mmHg ?Normal left ventricular systolic function ?Successful PCI to the mid left circumflex with placement of a 2.75 x 22 mm Onyx DES with excellent angiographic result and TIMI-3 flow ?40 cc right femoral IABP placed for coronary perfusion with plans for revascularization of the distal left main in the next few days ?  ?Recommendations: ?1.  Dual antiplatelet therapy with aspirin and Plavix for 12  months, ideally longer ?2.  Emergent transfer to [facility] with IABP in place for consideration of high risk PCI to the left main coronary artery ?3.  Recommend initiation of heparin for IABP on transfer to [facility] ? ?Diagnostic ?Dominance: Right ?Intervention ? ? ?_____________ ? ?Echocardiogram 12/31/2021: ?Impressions: ? 1. Mild anterolateral hypokinesis. Left ventricular ejection fraction, by  ?estimation, is 50 to 55%. The left ventricle has low normal function.  ?There is moderate left ventricular hypertrophy. Left ventricular diastolic  ?parameters are consistent with  ?Grade I diastolic dysfunction (impaired relaxation).  ? 2. Right ventricular systolic function is mildly reduced. The right  ?ventricular size is normal. Tricuspid regurgitation signal is inadequate  ?for assessing PA pressure.  ? 3. The mitral valve is normal in structure. Trivial mitral valve  ?regurgitation.  ? 4. There is moderate calcification of the aortic valve. Aortic valve  ?regurgitation is not visualized.  ? 5. The inferior vena cava is normal in size with greater than 50%  ?respiratory variability, suggesting right atrial pressure of 3 mmHg.  ?_______________ ? ?Coronary Stent Intervention 01/01/2022: ?  Mid LM to Ost LAD lesion is 80% stenosed.  After orbital atherectomy, a drug-eluting stent was successfully placed using a SYNERGY XD 3.50X16, postdilated to 4 mm.  Optimized with IVUS. ?  Post intervention, there is a 0% residual stenosis. ?  Prox LAD lesion is 80% stenosed.  After orbital atherectomy, a drug-eluting stent was successfully placed using a SYNERGY XD 3.0X16; proximal stent postdilated to 4.0 mm.  Optimized with IVUS. ?  Post intervention, there is a 0% residual stenosis. ?  Mid Circumflex stent was patent. ?  Prox RCA lesion is 70% stenosed noted from prior cath. ?  LV end diastolic pressure is moderately elevated. ?  There is no aortic valve stenosis. ?  ?Successful orbital atherectomy and stenting of the  distal left main, ostial LAD and mid LAD.  Continue dual antiplatelet therapy for at least 12 months.  Would suggest clopidogrel monotherapy beyond that time.  Continue aggressive secondary prevention.  She still has a right coronary artery lesion that can be addressed at a later time.  LVEDP was elevated.  She will need diuretics.  Heparin stopped.  Patient was hemodynamically stable throughout.  Hopeful to remove intra-aortic balloon pump later today. ? ?Diagnostic ?Dominance: Right ?Intervention ? ? ?  ?History of Present Illness   ?  ?Kristy Patterson is a 86 y.o. female with hypertension, hypothyroidism, CKD stage III, GERD, IBS, diverticulitis, and recurrent UTIs but no known cardiac history prior to this admission. Patient presented to [facility] on 12/30/2021 with chest pain and was found to have inferior ST elevations on EKG. Code STEMI was called and patient underwent emergent cardiac catheterization with Dr. Corky Sox at [facility]. Cath showed severe 3 vessel CAD with 80% stenosis of left main, 95% stenosis of ostial LAD followed by 80% stenosis of proximal LAD, 99% stenosis of proximal to mid LCX, and 70% stenosis of proximal RCA. Patient underwent emergent PCI with DES to LCX due to abrupt closure. Intra-aortic balloon pump was placed and patient was transferred to [facility] for treatment of left main disease. ? ?Hospital Course  ?   ?Consultants: CT Surgery and GI ? ?Acute STEMI ?CAD ?Patient was transferred from [facility] to [facility] on 12/30/2021 for consideration of high-risk PCI after presenting with STEMI and being found to have severe 3 vessel CAD including 80% of the left main as well severe ostial and mid LAD disease and severe proximal LCX disease as described above. Patient underwent successful PCI with DES to LCX lesion at [facility] and had intra-aortic balloon pump placed and then was transferred to [facility] for consideration of high-risk PCI of left main. Echo on 12/31/2021 showed LVEF of 50-55% with mild  anterolateral hypokinesis and grade 1 diastolic dysfunction. RV systolic function was mildly reduced. She was seen by CT surgeon and felt to be poor candidate for surgery and safest option was felt to e PCI of left main and LAD. Patient was taken back to the cath lab on 01/01/2022 and underwent successful orbital atherectomy and stenting of the distal left main, ostial LAD, and mid LAD. Residual RCA disease was treated medically. Balloon pump was able to be removed later that day. She tolerated procedure well with no further chest pain. She will require uninterrupted DAPT with Aspirin and Plavix for at least 12 months. Home Diltiazem stopped and she was started on Lopressor and Imdur. She will be discharged on Lopressor 50mg  twice daily, Imdur 15mg  daily, and Lipitor 80mg  daily. ? ?Acute Diastolic CHF ?Patient intermittent volume overloaded on admission. Echo showed  50-55% with mild anterolateral hypokinesis and grade 1 diastolic dysfunction. RV systolic function was mildly reduced.  She received one dose of IV Lasix and then was later started on PO Lasix 40mg  daily. Will continue this on discharge. ? ?Hypertension ?BP mostly well controlled. Home Diltiazem and Losartan stopped and she was started on Lopressor 50mg  twice daily and Imdur 15mg  daily instead. She  was also started on Lasix 40mg  daily for mild volume overload which we will continue at discharge. ? ?Hyperlipidemia ?Lipid panel this admission: Total Cholesterol 241, Triglycerides 104, HDL 58, LDL 162. Started on Lipitor 80mg  daily. Will need repeat lipid panel and LFTs in 6-8 weeks ? ?Hypothyroidism ?Continue home Synthroid.  ? ?CKD Stage III ?Creatinine 1.28 on admission and has been fluctuating some this admission. Creatinine 1.54 on day of discharge. Baseline creatinine around 1.1 to 1.3. Some of this may be due to urinary retention with hydroureteronephrosis. She is going home with a Foley. Home Losartan stopped. Would recommended repeating BMET at  follow-up visit. ? ?IBS with Constipation ?History of Diverticulitis ?Follows with Dr. Haig Prophet in [place] and had plans to undergo a contrasted abdominal/pelvic CT scan as an outpatient but this never ha

## 2022-01-05 NOTE — Progress Notes (Addendum)
? ?       Daily Rounding Note ? ?01/05/2022, 9:50 AM ? LOS: 5 days  ? ?SUBJECTIVE:   ?Chief complaint:    Chronic abdominal pain ? ?Feels tired.  Never got the Dulcolax suppository or milk of magnesia because she did not feel like taking it right after she had eaten last night and now she has had breakfast and did not want to take it right after breakfast.  She anticipates taking these medications after working with OT/PT this morning. ?Now says she is feeling discomfort in the right upper quadrant.  Tolerating solid food but appetite reduced.  Surmises that last bowel movement was probably 2 days ago on Sunday. ? ? ?OBJECTIVE:        ? Vital signs in last 24 hours:    ?Temp:  [97.8 ?F (36.6 ?C)-98.6 ?F (37 ?C)] 97.8 ?F (36.6 ?C) (05/09 0446) ?Pulse Rate:  [84-100] 100 (05/08 2005) ?Resp:  [16-19] 16 (05/09 0446) ?BP: (133-153)/(57-85) 135/57 (05/09 0446) ?SpO2:  [93 %-99 %] 99 % (05/09 0446) ?Weight:  [80.5 kg-81.4 kg] 80.5 kg (05/09 0136) ?Last BM Date : 01/02/22 ?Filed Weights  ? 01/03/22 0600 01/04/22 1257 01/05/22 0136  ?Weight: 81.4 kg 81.4 kg 80.5 kg  ? ?General: Obese, depressed.  Sitting on commode.  Does not appear uncomfortable. ?Heart: RRR. ?Chest: Clear bilaterally.  No labored breathing or cough ?Abdomen: Soft.  Minimal discomfort/tenderness in the right upper abdomen.  Bowel sounds active. ?Extremities: No CCE. ?Neuro/Psych: Affect depressed but pleasant, cooperative.  Fully alert and oriented. ? ?Intake/Output from previous day: ?05/08 0701 - 05/09 0700 ?In: 870 [P.O.:870] ?Out: 575 [Urine:575] ? ?Intake/Output this shift: ?No intake/output data recorded. ? ?Lab Results: ?Recent Labs  ?  01/03/22 ?0107 01/04/22 ?0031  ?WBC 14.0* 10.0  ?HGB 12.1 12.6  ?HCT 37.2 39.4  ?PLT 142* 215  ? ?BMET ?Recent Labs  ?  01/03/22 ?0107 01/04/22 ?0031  ?NA 130* 132*  ?K 4.4 4.5  ?CL 100 103  ?CO2 21* 20*  ?GLUCOSE 104* 97  ?BUN 29* 32*  ?CREATININE 1.26* 1.45*   ?CALCIUM 8.5* 8.6*  ? ?LFT ?No results for input(s): PROT, ALBUMIN, AST, ALT, ALKPHOS, BILITOT, BILIDIR, IBILI in the last 72 hours. ?PT/INR ?No results for input(s): LABPROT, INR in the last 72 hours. ?Hepatitis Panel ?No results for input(s): HEPBSAG, HCVAB, HEPAIGM, HEPBIGM in the last 72 hours. ? ?Studies/Results: ?No results found. ? ?ASSESMENT:  ? ?  Chronic lower abd pain.  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   ? ?AKI.  Baseline CKD stage IIIb.  No BUN/creat today.   ? ?  IBS, constipation predominate but spells of diarrhea occur after periods of constipation.  Given Dulcolax PR and MOM po yesterday.   ? ?  Hyponatremia.   ? ?  Inferior STEMI w CFX DES 5/3.  Atherectomy left main and ostial LAD and further stenting 5/5.  On aspirin, Plavix. ? ? ?PLAN  ? ?  CTAP w IV, po, rectal contrast when renal fx improves.  I have ordered bmet for today.  If she is still here we will obtain BMET in the morning as well.  If unable to obtain the CT scan before she is ready for discharge, she will need to have Dr. Haig Prophet, her GI in [place], reschedule this.  Previously scheduled CT was postponed/canceled due to admission with cardiac issues. ? ? ? ?Kristy Patterson  01/05/2022, 9:50 AM ?Phone [phone]  ? ?  I have taken an interval history, thoroughly reviewed the chart and examined the patient. I agree with the Advanced Practitioner's note, impression and recommendations, and have recorded additional findings, impressions and recommendations below. ?I performed a substantive portion of this encounter (>50% time spent), including a complete performance of the medical decision making. ? ?My additional thoughts are as follows: ? ?Patient's GI symptoms and abdominal exam similar to yesterday.  She is still not had a bowel movement despite a dose of milk magnesia this morning and required suppository this afternoon. ?Cardiology note also reviewed, and even with mild  increase in the creatinine, they are recommending outpatient CTAP along with close follow-up with her regular GI physician. ? ?I ordered a tapwater enema and spoke with the nurse about that.  If no results from that overnight, we will do a rectal exam tomorrow to see if she has an impaction requiring further enema treatment. ? ? ?Kristy Patterson ?Office:[phone] ? ?

## 2022-01-05 NOTE — Progress Notes (Addendum)
Scant amount of blood noted in toilet after attempt to have bowel movement. Patient states that she has a known history of hemorrhoids but this is the first she has experienced bleeding. ? ?Patient deferred tap water enema stating "I don't want to do it anymore". ?

## 2022-01-05 NOTE — TOC Transition Note (Addendum)
Transition of Care (TOC) - CM/SW Discharge Note ? ? ?Patient Details  ?Name: Kristy Patterson ?MRN: [id number] ?Date of Birth: 11-25-1935 ? ?Transition of Care (TOC) CM/SW Contact:  ?Leone Haven, RN ?Phone Number: ?01/05/2022, 4:25 PM ? ? ?Clinical Narrative:    ?NCM spoke with patient, offered choice, she has no preference, NCM made referral to [facility] for HHPT,  she will be going to her daughter's address of [address] [place] [address].  She will also need a rolling walker , she is ok with [employer] supplying this for her. NCM made referral to [facility] with [facility]. Daughter will transport her home at dc.  ? ? ?Final next level of care: Home w Home Health Services ?Barriers to Discharge: No Barriers Identified ? ? ?Patient Goals and CMS Choice ?Patient states their goals for this hospitalization and ongoing recovery are:: return to daughters home ?CMS Medicare.gov Compare Post Acute Care list provided to:: Patient ?Choice offered to / list presented to : Patient ? ?Discharge Placement ?  ?           ?  ?  ?  ?  ? ?Discharge Plan and Services ?  ?  ?           ?DME Arranged: Walker rolling, Bedside commode ?DME Agency: [employer] ?Date DME Agency Contacted: 01/05/22 ?Time DME Agency Contacted: 1624 ?Representative spoke with at DME Agency: Leavy Cella ?HH Arranged: PT ?HH Agency: [facility] ?Date HH Agency Contacted: 01/05/22 ?Time HH Agency Contacted: 1625 ?Representative spoke with at [facility] Agency: Kandee Keen ? ?Social Determinants of Health (SDOH) Interventions ?  ? ? ?Readmission Risk Interventions ?   ? View : No data to display.  ?  ?  ?  ? ? ? ? ? ?

## 2022-01-05 NOTE — Assessment & Plan Note (Signed)
Elevated LVEDP on initial presentation -- IABP in place.   ?Will probably need IV Lasix, but will hold for now pending reassessment of rneal function & measurement of LVEDP during PCI. ?

## 2022-01-05 NOTE — Progress Notes (Signed)
? ?Progress Note ? ?Patient Name: Kristy Patterson ?Date of Encounter: 01/05/2022 ? ?Primary Cardiologist: Armando Reichert, MD ? ?Subjective  ? ?No chest pain. Mild abdominal pain.  ? ?Inpatient Medications  ?  ?Scheduled Meds: ? aspirin EC  81 mg Oral Daily  ? atorvastatin  80 mg Oral Daily  ? bisacodyl  10 mg Rectal Once  ? Chlorhexidine Gluconate Cloth  6 each Topical Daily  ? clopidogrel  75 mg Oral Q breakfast  ? heparin  5,000 Units Subcutaneous Q8H  ? levothyroxine  125 mcg Oral Q0600  ? magnesium hydroxide  30 mL Oral Once  ? metoprolol tartrate  25 mg Oral BID  ? sodium chloride flush  3 mL Intravenous Q12H  ? sodium chloride flush  3 mL Intravenous Q12H  ? ?Continuous Infusions: ? sodium chloride 10 mL/hr at 01/01/22 2000  ? sodium chloride 10 mL/hr at 01/01/22 0700  ? sodium chloride    ? ?PRN Meds: ?sodium chloride, sodium chloride, sodium chloride, acetaminophen, alum & mag hydroxide-simeth, bisacodyl, morphine injection, nitroGLYCERIN, ondansetron (ZOFRAN) IV, sodium chloride flush  ? ?Vital Signs  ?  ?Vitals:  ? 01/04/22 2005 01/05/22 8882 01/05/22 0136 01/05/22 0446  ?BP: (!) 143/70 133/74  (!) 135/57  ?Pulse: 100     ?Resp: 19 17  16   ?Temp: 98.6 ?F (37 ?C) 98.4 ?F (36.9 ?C)  97.8 ?F (36.6 ?C)  ?TempSrc: Oral Oral  Oral  ?SpO2: 97% 94%  99%  ?Weight:   80.5 kg   ?Height:      ? ? ?Intake/Output Summary (Last 24 hours) at 01/05/2022 1030 ?Last data filed at 01/05/2022 0600 ?Gross per 24 hour  ?Intake 720 ml  ?Output 575 ml  ?Net 145 ml  ? ?Filed Weights  ? 01/03/22 0600 01/04/22 1257 01/05/22 0136  ?Weight: 81.4 kg 81.4 kg 80.5 kg  ? ? ?Telemetry  ?  ?Sinus- Personally reviewed. ? ?ECG  ?  ?No AM EKG ? ?Physical Exam  ? ? ?General: Well developed, well nourished, NAD  ?HEENT: OP clear, mucus membranes moist  ?SKIN: warm, dry. No rashes. ?Neuro: No focal deficits  ?Musculoskeletal: Muscle strength 5/5 all ext  ?Psychiatric: Mood and affect normal  ?Neck: No JVD ?Lungs:Clear bilaterally, no wheezes, rhonci,  crackles ?Cardiovascular: Regular rate and rhythm. No murmurs, gallops or rubs. ?Abdomen:Soft. Bowel sounds present.  ?Extremities: No lower extremity edema. ? ? ?Labs  ?  ?Chemistry ?Recent Labs  ?Lab 12/30/21 ?2211 12/31/21 ?0429 01/02/22 ?8003 01/03/22 ?0107 01/04/22 ?0031  ?NA 129*   < > 128* 130* 132*  ?K 3.3*   < > 4.4 4.4 4.5  ?CL 94*   < > 99 100 103  ?CO2 23   < > 20* 21* 20*  ?GLUCOSE 178*   < > 132* 104* 97  ?BUN 18   < > 20 29* 32*  ?CREATININE 1.28*   < > 1.15* 1.26* 1.45*  ?CALCIUM 8.7*   < > 8.3* 8.5* 8.6*  ?PROT 7.3  --   --   --   --   ?ALBUMIN 3.7  --   --   --   --   ?AST 57*  --   --   --   --   ?ALT 18  --   --   --   --   ?ALKPHOS 104  --   --   --   --   ?BILITOT 0.5  --   --   --   --   ?  GFRNONAA 41*   < > 47* 42* 36*  ?ANIONGAP 12   < > 9 9 9   ? < > = values in this interval not displayed.  ?  ? ?Hematology ?Recent Labs  ?Lab 01/02/22 ?[id number] 01/03/22 ?0107 01/04/22 ?0031  ?WBC 14.5* 14.0* 10.0  ?RBC 4.13 4.28 4.43  ?HGB 12.0 12.1 12.6  ?HCT 36.0 37.2 39.4  ?MCV 87.2 86.9 88.9  ?MCH 29.1 28.3 28.4  ?MCHC 33.3 32.5 32.0  ?RDW 13.9 14.2 14.4  ?PLT 147* 142* 215  ? ? ?Cardiac Enzymes ?Recent Labs  ?Lab 12/30/21 ?2211 12/31/21 ?0429  ?TROPONINIHS 4,097* >24,000*  ? ? ?BNP ?Recent Labs  ?Lab 12/31/21 ?0429  ?BNP 326.2*  ?  ? ?Radiology  ?  ?No results found. ? ?Assessment & Plan  ?  ?1.  CAD/Inferior STEMI: She was admitted to [facility] with an inferior STEMI on 12/30/21. The culprit vessel was the Circumflex which was treated with a drug eluting stent. She was transferred to [facility] and had orbital atherectomy of the distal left main and ostial LAD with stenting on 01/01/22. She has a residual moderate RCA stenosis that will be treated medically.  ?- No chest pain ?- will continue ASA, Plavix, beta blocker and statin.  ? ?2. Hyperlipidemia: Continue statin ? ?3. CKD stage IIIb: BMET pending today  ? ?4. History of IBS and reported diverticulosis:  She follows with Dr. Haig Prophet, gastroenterology in [place].   She was planned to undergo a contrasted abdominal pelvic CT as an outpatient however this never occurred given her presentation with ACS.  She has had intermittent lower abdominal cramping and constipation. She is now complaining of moderately severe lower abdominal pain. Appreciate recs of our GI team yesterday. We will plan to treat her with Dulcolax and milk of magnesia. Her CT scans will be done as an outpatient.   ? ?5. Dispo: Home health per PT. She states that she cannot go home today. She will be going home with her daughter tomorrow in [place]. We will plan d/c home tomorrow.  ? ? ?Signed, ?Lauree Chandler, MD  ?01/05/2022, 10:30 AM    ?

## 2022-01-05 NOTE — Progress Notes (Signed)
Physical Therapy Treatment ?Patient Details ?Name: Kristy Patterson ?MRN: [id number] ?DOB: Feb 02, 1936 ?Today's Date: 01/05/2022 ? ? ?History of Present Illness 86 y.o. F who presents 12/31/2021 with STEMI. Found to have hazy 99 % thrombotic hazy lesion in the LCx thought to be the culprit lesion.  With guide catheter found to have 80% distal LMCA, 90% ostial LAD, 80% mid LAD lesion as well as symptoms in RCA in addition to the culprit lesion. S/p PCI of LCx. Significant PMH: HTN, CKD, diverticulitis, recurrent UTIs. ? ?  ?PT Comments  ? ? Patient progressing well towards PT goals. Reports abdominal discomfort today due to needing to have a BM. Session focused on stair training and progressive ambulation. Pt continues to prefer RW over rollator. Daughter was not able to be present for family training today due to an emergency but would like to come tomorrow to practice stairs with patient. Requires Min A to ascend/descend steps with HHA for support to simulate rail. HR ranging from 80s-115 bpm with activity with rest breaks needed. Encouraged walking as much as tolerated. Will follow. ?   ?Recommendations for follow up therapy are one component of a multi-disciplinary discharge planning process, led by the attending physician.  Recommendations may be updated based on patient status, additional functional criteria and insurance authorization. ? ?Follow Up Recommendations ? Home health PT ?  ?  ?Assistance Recommended at Discharge PRN  ?Patient can return home with the following A little help with walking and/or transfers;A little help with bathing/dressing/bathroom;Assistance with cooking/housework;Assist for transportation;Help with stairs or ramp for entrance ?  ?Equipment Recommendations ? Rolling walker (2 wheels)  ?  ?Recommendations for Other Services   ? ? ?  ?Precautions / Restrictions Precautions ?Precautions: Fall ?Restrictions ?Weight Bearing Restrictions: No  ?  ? ?Mobility ? Bed Mobility ?Overal bed mobility: Needs  Assistance ?Bed Mobility: Supine to Sit ?  ?  ?Supine to sit: Min assist, HOB elevated ?  ?  ?General bed mobility comments: Assist with trunk to get to EOB, due to abdominal discomfort. ?  ? ?Transfers ?Overall transfer level: Needs assistance ?Equipment used: Rolling walker (2 wheels), Rollator (4 wheels) ?Transfers: Sit to/from Stand ?Sit to Stand: Supervision ?  ?  ?  ?  ?  ?General transfer comment: SUpervision for safety, cues for hand placement when using rollator esp. Stood from [employer], from [facility] x1, from rollator seat x2, transferred to recliner post session. ?  ? ?Ambulation/Gait ?Ambulation/Gait assistance: Supervision ?Gait Distance (Feet): 130 Feet (x2 bouts) ?Assistive device: Rolling walker (2 wheels), Rollator (4 wheels) ?Gait Pattern/deviations: Step-through pattern, Decreased stride length ?Gait velocity: .97 ft/sec ?Gait velocity interpretation: <1.31 ft/sec, indicative of household ambulator ?  ?General Gait Details: Slow, steady gait with rollator on first trial and then RW for second half, 1 seated rest break before stairs and 1 after stairs. HR 90-115 bpm throughout. ? ? ?Stairs ?Stairs: Yes ?Stairs assistance: Min assist ?Stair Management: No rails, Forwards, One rail Left ?Number of Stairs: 3 ?General stair comments: 1 UE support and Min A to ascend stairs and use of rail and HHA to descend stairs. Pt afraid of heights. Daughter unable to be here for family training on stairs due to emergencyb but will let nurse know when daughter will be here next who will relay to PT. ? ? ?Wheelchair Mobility ?  ? ?Modified Rankin (Stroke Patients Only) ?  ? ? ?  ?Balance Overall balance assessment: Needs assistance ?Sitting-balance support: Feet supported, No upper extremity supported ?Sitting  balance-Leahy Scale: Good ?Sitting balance - Comments: Able to perform pericare without difficulty. ?  ?Standing balance support: During functional activity ?Standing balance-Leahy Scale: Fair ?Standing balance  comment: Does better with UE support for walking, able to stand at sink and wash hands without difficulty. ?  ?  ?  ?  ?  ?  ?  ?  ?  ?  ?  ?  ? ?  ?Cognition Arousal/Alertness: Awake/alert ?Behavior During Therapy: [facility] for tasks assessed/performed ?Overall Cognitive Status: Within Functional Limits for tasks assessed ?  ?  ?  ?  ?  ?  ?  ?  ?  ?  ?  ?  ?  ?  ?  ?  ?  ?  ?  ? ?  ?Exercises   ? ?  ?General Comments General comments (skin integrity, edema, etc.): HR 90-115 bpm during activity. ?  ?  ? ?Pertinent Vitals/Pain Pain Assessment ?Pain Assessment: Faces ?Faces Pain Scale: Hurts little more ?Pain Location: discomfort in abdomen ?Pain Descriptors / Indicators: Discomfort ?Pain Intervention(s): Monitored during session  ? ? ?Home Living   ?  ?  ?  ?  ?  ?  ?  ?  ?  ?   ?  ?Prior Function    ?  ?  ?   ? ?PT Goals (current goals can now be found in the care plan section) Progress towards PT goals: Progressing toward goals ? ?  ?Frequency ? ? ? Min 3X/week ? ? ? ?  ?PT Plan Current plan remains appropriate  ? ? ?Co-evaluation   ?  ?  ?  ?  ? ?  ?AM-PAC PT "6 Clicks" Mobility   ?Outcome Measure ? Help needed turning from your back to your side while in a flat bed without using bedrails?: None ?Help needed moving from lying on your back to sitting on the side of a flat bed without using bedrails?: A Little ?Help needed moving to and from a bed to a chair (including a wheelchair)?: A Little ?Help needed standing up from a chair using your arms (e.g., wheelchair or bedside chair)?: A Little ?Help needed to walk in hospital room?: A Little ?Help needed climbing 3-5 steps with a railing? : A Little ?6 Click Score: 19 ? ?  ?End of Session Equipment Utilized During Treatment: Gait belt ?Activity Tolerance: Patient tolerated treatment well ?Patient left: in chair;with call bell/phone within reach ?Nurse Communication: Mobility status ?PT Visit Diagnosis: Unsteadiness on feet (R26.81);Difficulty in walking, not elsewhere  classified (R26.2) ?  ? ? ?Time: 9233-0076 ?PT Time Calculation (min) (ACUTE ONLY): 40 min ? ?Charges:  $Gait Training: 23-37 mins ?$Therapeutic Activity: 8-22 mins          ?          ? ?Vale Haven, PT, DPT ?Acute Rehabilitation Services ?Secure chat preferred ?Office [phone] ? ? ? ? ? ?Blake Divine A Ciena Sampley ?01/05/2022, 12:30 PM ? ?

## 2022-01-06 ENCOUNTER — Inpatient Hospital Stay (HOSPITAL_COMMUNITY): Payer: Medicare Other

## 2022-01-06 DIAGNOSIS — I2119 ST elevation (STEMI) myocardial infarction involving other coronary artery of inferior wall: Secondary | ICD-10-CM | POA: Diagnosis not present

## 2022-01-06 DIAGNOSIS — Z8719 Personal history of other diseases of the digestive system: Secondary | ICD-10-CM

## 2022-01-06 LAB — BASIC METABOLIC PANEL
Anion gap: 5 (ref 5–15)
BUN: 24 mg/dL — ABNORMAL HIGH (ref 8–23)
CO2: 24 mmol/L (ref 22–32)
Calcium: 8.8 mg/dL — ABNORMAL LOW (ref 8.9–10.3)
Chloride: 105 mmol/L (ref 98–111)
Creatinine, Ser: 1.41 mg/dL — ABNORMAL HIGH (ref 0.44–1.00)
GFR, Estimated: 37 mL/min — ABNORMAL LOW (ref >60)
Glucose, Bld: 110 mg/dL — ABNORMAL HIGH (ref 70–99)
Potassium: 4.1 mmol/L (ref 3.5–5.1)
Sodium: 134 mmol/L — ABNORMAL LOW (ref 135–145)

## 2022-01-06 MED ORDER — IOHEXOL 300 MG/ML  SOLN
75.0000 mL | Freq: Once | INTRAMUSCULAR | Status: AC | PRN
  Administered 2022-01-06: 75 mL via INTRAVENOUS

## 2022-01-06 MED ORDER — METOPROLOL TARTRATE 50 MG PO TABS
50.0000 mg | ORAL_TABLET | Freq: Two times a day (BID) | ORAL | Status: DC
  Administered 2022-01-06 – 2022-01-08 (×4): 50 mg via ORAL
  Filled 2022-01-06 (×5): qty 1

## 2022-01-06 MED ORDER — ISOSORBIDE MONONITRATE ER 30 MG PO TB24
15.0000 mg | ORAL_TABLET | Freq: Every day | ORAL | Status: DC
  Administered 2022-01-06 – 2022-01-07 (×2): 15 mg via ORAL
  Filled 2022-01-06 (×2): qty 1

## 2022-01-06 MED ORDER — CHLORHEXIDINE GLUCONATE CLOTH 2 % EX PADS
6.0000 | MEDICATED_PAD | Freq: Every day | CUTANEOUS | Status: DC
  Administered 2022-01-06 – 2022-01-08 (×3): 6 via TOPICAL

## 2022-01-06 MED ORDER — ISOSORBIDE MONONITRATE ER 30 MG PO TB24: 30.0000 mg | ORAL_TABLET | Freq: Every day | ORAL | Status: DC

## 2022-01-06 MED ORDER — SODIUM CHLORIDE 0.9 % IV SOLN: INTRAVENOUS | Status: AC

## 2022-01-06 MED ORDER — LIDOCAINE HCL URETHRAL/MUCOSAL 2 % EX GEL
1.0000 "application " | Freq: Once | CUTANEOUS | Status: DC
  Filled 2022-01-06 (×2): qty 6

## 2022-01-06 NOTE — Progress Notes (Addendum)
[facility] Progress Note ? ?Chief Complaint: Abdominal pain and constipation ? ?History: ? ?She remains about the same today regarding crampy lower abdominal pain, bloating and feeling of distention.  She was straining for bowel movements last evening, had 3-4 small ones and then some blood per rectum, so she refused the tapwater enema late last evening. ?Due to the symptoms her oral intake is decreased.  She is going to try some prune juice shortly followed by some Maalox. ? ?ROS: ?Cardiovascular: Denies chest pain ?Respiratory: Denies dyspnea ?Urinary: Chronically feels difficulty passing urine ? ?Objective: ? ? ?Current Facility-Administered Medications:  ?  0.9 %  sodium chloride infusion, , Intravenous, PRN, Corky Crafts, MD, Last Rate: 10 mL/hr at 01/01/22 2000, Rate Verify at 01/01/22 2000 ?  0.9 %  sodium chloride infusion, , Intravenous, PRN, Corky Crafts, MD, Last Rate: 10 mL/hr at 01/01/22 0700, Infusion Verify at 01/01/22 0700 ?  0.9 %  sodium chloride infusion, 250 mL, Intravenous, PRN, Corky Crafts, MD ?  acetaminophen (TYLENOL) tablet 650 mg, 650 mg, Oral, Q4H PRN, Corky Crafts, MD, 650 mg at 01/06/22 0809 ?  alum & mag hydroxide-simeth (MAALOX/MYLANTA) 200-200-20 MG/5ML suspension 30 mL, 30 mL, Oral, Q2H PRN, Corky Crafts, MD, 30 mL at 01/06/22 0851 ?  aspirin EC tablet 81 mg, 81 mg, Oral, Daily, Corky Crafts, MD, 81 mg at 01/05/22 9326 ?  atorvastatin (LIPITOR) tablet 80 mg, 80 mg, Oral, Daily, Corky Crafts, MD, 80 mg at 01/05/22 0947 ?  bisacodyl (DULCOLAX) suppository 10 mg, 10 mg, Rectal, Daily PRN, Jonelle Sidle, MD, 10 mg at 01/05/22 1512 ?  bisacodyl (DULCOLAX) suppository 10 mg, 10 mg, Rectal, Once, Gribbin, Sarah J, PA-C ?  clopidogrel (PLAVIX) tablet 75 mg, 75 mg, Oral, Q breakfast, Lance Muss S, MD, 75 mg at 01/06/22 0809 ?  furosemide (LASIX) tablet 40 mg, 40 mg, Oral, Daily, Kathleene Hazel, MD, 40 mg at  01/05/22 1507 ?  heparin injection 5,000 Units, 5,000 Units, Subcutaneous, Q8H, Corky Crafts, MD, 5,000 Units at 01/05/22 2259 ?  levothyroxine (SYNTHROID) tablet 125 mcg, 125 mcg, Oral, Q0600, Kathleene Hazel, MD, 125 mcg at 01/05/22 0617 ?  magnesium hydroxide (MILK OF MAGNESIA) suspension 30 mL, 30 mL, Oral, Once, Gribbin, Sarah J, PA-C ?  metoprolol tartrate (LOPRESSOR) tablet 25 mg, 25 mg, Oral, BID, Eldridge Dace, Jayadeep S, MD, 25 mg at 01/05/22 2259 ?  morphine (PF) 2 MG/ML injection 1 mg, 1 mg, Intravenous, Q2H PRN, Marykay Lex, MD, 1 mg at 01/01/22 1845 ?  nitroGLYCERIN (NITROSTAT) SL tablet 0.4 mg, 0.4 mg, Sublingual, Q5 Min x 3 PRN, Corky Crafts, MD ?  ondansetron [facility]) injection 4 mg, 4 mg, Intravenous, Q6H PRN, Corky Crafts, MD, 4 mg at 01/01/22 0549 ?  sodium chloride flush (NS) 0.9 % injection 3 mL, 3 mL, Intravenous, Q12H, Corky Crafts, MD, 3 mL at 01/04/22 0920 ?  sodium chloride flush (NS) 0.9 % injection 3 mL, 3 mL, Intravenous, Q12H, Lance Muss S, MD, 3 mL at 01/06/22 0019 ?  sodium chloride flush (NS) 0.9 % injection 3 mL, 3 mL, Intravenous, PRN, Corky Crafts, MD ? ? sodium chloride 10 mL/hr at 01/01/22 2000  ? sodium chloride 10 mL/hr at 01/01/22 0700  ? sodium chloride    ?  ? ?Vital signs in last 24 hrs: ?Vitals:  ? 01/05/22 1948 01/06/22 0348  ?BP: 107/87 132/62  ?Pulse: (!) 103 93  ?Resp:  17 17  ?Temp: 98.4 ?F (36.9 ?C) 98.1 ?F (36.7 ?C)  ?SpO2: 95% 96%  ? ? ?Intake/Output Summary (Last 24 hours) at 01/06/2022 0853 ?Last data filed at 01/06/2022 0019 ?Gross per 24 hour  ?Intake 803 ml  ?Output 350 ml  ?Net 453 ml  ? ? ? ?Physical Exam ?Not acutely ill-appearing ?HEENT: sclera anicteric, oral mucosa without lesions ?Neck: supple, no thyromegaly, JVD or lymphadenopathy ?Cardiac: RRR without murmurs, S1S2 heard, no peripheral edema ?Pulm: clear to auscultation bilaterally, normal RR and effort noted ?Abdomen: soft, diffuse mid to lower  tenderness, with active bowel sounds. No guarding ?Skin; warm and dry, no jaundice ?Rectal exam was performed in the presence of the patient's nurse.  Redundant perianal skin folds, mildly decreased sphincter tone, scant red blood on the glove, no palpable internal lesions and specifically no impaction felt. ?Recent Labs: ? ? ?  Latest Ref Rng & Units 01/04/2022  ? 12:31 AM 01/03/2022  ?  1:07 AM 01/02/2022  ? 12:57 AM  ?CBC  ?WBC 4.0 - 10.5 K/uL 10.0   14.0   14.5    ?Hemoglobin 12.0 - 15.0 g/dL 16.112.6   09.612.1   04.512.0    ?Hematocrit 36.0 - 46.0 % 39.4   37.2   36.0    ?Platelets 150 - 400 K/uL 215   142   147    ? ? ?Recent Labs  ?Lab 12/30/21 ?2211  ?INR 0.9  ? ? ?  Latest Ref Rng & Units 01/06/2022  ?  4:09 AM 01/05/2022  ? 10:16 AM 01/04/2022  ? 12:31 AM  ?CMP  ?Glucose 70 - 99 mg/dL 409110   811114   97    ?BUN 8 - 23 mg/dL 24   23   32    ?Creatinine 0.44 - 1.00 mg/dL 9.141.41   7.821.33   9.561.45    ?Sodium 135 - 145 mmol/L 134   134   132    ?Potassium 3.5 - 5.1 mmol/L 4.1   4.0   4.5    ?Chloride 98 - 111 mmol/L 105   105   103    ?CO2 22 - 32 mmol/L 24   24   20     ?Calcium 8.9 - 10.3 mg/dL 8.8   8.8   8.6    ? ? ? ?Radiologic studies: ? ? ?Assessment & Plan  ?Assessment: ?Chronic lower abdominal pain and constipation with history of diverticulitis and perhaps colovaginal fistula. ? ?Admission with MI, status post PCI, on DAPT ?Cardiovascular stability since coronary interventions. ? ? ?She still has significant abdominal pain and constipation.  I think it would be best for her to have the CT abdomen and pelvis prior to discharge to be sure she does not have active diverticulitis or other complication, especially considering her weakened condition after MI. ?Creatinine fairly stable at 1.4 today, and perhaps this is her current baseline. ? ?Regarding the bleeding, it is benign anorectal bleeding from constipation and does not require cessation of aspirin or Plavix. ? ?I had originally felt the CT scan should be with oral, IV and rectal  contrast.  However, with this rectal bleeding, I would no longer advocate the rectal contrast. ?If her cardiologist is agreeable to her receiving IV contrast for the scan, then it should be done today.  Incidentally, the oral contrast will also likely help relieve the constipation. ? ? ?Charlie PitterHenry L Danis III ?Office: [phone] ? ?Addendum: ? ?I communicated with Dr. Clifton JamesMcAlhany by chat message and he  was agreeable to the CT scan with oral and IV contrast, so I have placed the order. ?She will get IV fluids before hand ? - Amada Jupiter, MD ?   [facility] ?9:14 AM ? ?

## 2022-01-06 NOTE — Progress Notes (Signed)
Physical Therapy Treatment ?Patient Details ?Name: Kristy Patterson ?MRN: [id number] ?DOB: 02/16/1936 ?Today's Date: 01/06/2022 ? ? ?History of Present Illness 86 y.o. F who presents 12/31/2021 with STEMI. Found to have hazy 99 % thrombotic hazy lesion in the LCx thought to be the culprit lesion.  With guide catheter found to have 80% distal LMCA, 90% ostial LAD, 80% mid LAD lesion as well as symptoms in RCA in addition to the culprit lesion. S/p PCI of LCx. Significant PMH: HTN, CKD, diverticulitis, recurrent UTIs. ? ?  ?PT Comments  ? ? Pt received in supine, c/o discomfort/bladder and abdominal tightness and pressure and agreeable to work with PTA with goal of getting OOB and up to [facility]. Pt limited due to bladder pressure/discomfort with pre-gait tasks at bedside/near [facility] and bladder urgency, asking for increased time to sit up on [facility] at end of session. Pt with call bell in reach and agreeable to call for assist once ready. Pt continues to benefit from PT services to progress toward functional mobility goals.    ?Recommendations for follow up therapy are one component of a multi-disciplinary discharge planning process, led by the attending physician.  Recommendations may be updated based on patient status, additional functional criteria and insurance authorization. ? ?Follow Up Recommendations ? Home health PT ?  ?  ?Assistance Recommended at Discharge PRN  ?Patient can return home with the following A little help with walking and/or transfers;A little help with bathing/dressing/bathroom;Assistance with cooking/housework;Assist for transportation;Help with stairs or ramp for entrance ?  ?Equipment Recommendations ? Rolling walker (2 wheels)  ?  ?Recommendations for Other Services   ? ? ?  ?Precautions / Restrictions Precautions ?Precautions: Fall ?Restrictions ?Weight Bearing Restrictions: No  ?  ? ?Mobility ? Bed Mobility ?Overal bed mobility: Needs Assistance ?Bed Mobility: Supine to Sit ?  ?  ?Supine to sit: Min assist ?   ?  ?General bed mobility comments: increased time/effort, use of bed rail ?  ? ?Transfers ?Overall transfer level: Needs assistance ?Equipment used: Rolling walker (2 wheels) ?Transfers: Sit to/from Stand, Bed to chair/wheelchair/BSC ?Sit to Stand: Min guard ?  ?Step pivot transfers: Min guard ?  ?  ?  ?General transfer comment: from EOB<>BSC and STS x 3 from [facility] for clean-up then needing to sit due to abdominal pressure/bladder pressure ?  ? ?Ambulation/Gait ?  ?  ?  ?  ?  ?  ?Pre-gait activities: pivotal steps at bedside ~60ft and standing marching x10 reps between urination attempts. ?General Gait Details: pt defer until after toileting ? ? ?Stairs ?  ?  ?  ?  ?  ? ? ?Wheelchair Mobility ?  ? ?Modified Rankin (Stroke Patients Only) ?  ? ? ?  ?Balance Overall balance assessment: Mild deficits observed, not formally tested ?  ?  ?  ?  ?  ?  ?  ?  ?  ?  ?  ?  ?  ?  ?  ?  ?  ?  ?  ? ?  ?Cognition Arousal/Alertness: Awake/alert ?Behavior During Therapy: [facility] for tasks assessed/performed, Anxious ?  ?  ?  ?  ?  ?  ?  ?  ?  ?  ?  ?  ?  ?  ?  ?  ?  ?General Comments: Pt at times with mild STM deficits/confusion as to safe hand placement, pt distracted due to pressure in bladder, reports some relief after sitting on BSC. ?  ?  ? ?  ?Exercises   ? ?  ?  General Comments General comments (skin integrity, edema, etc.): HR 100 bpm sitting, no acute s/sx distress other than abdominal/bladder pressure/pain ?  ?  ? ?Pertinent Vitals/Pain Pain Assessment ?Pain Assessment: Faces ?Faces Pain Scale: Hurts even more ?Pain Location: discomfort in abdomen/bladder ?Pain Descriptors / Indicators: Tightness, Discomfort, Pressure ?Pain Intervention(s): Limited activity within patient's tolerance, Monitored during session, Repositioned  ? ? ? ?PT Goals (current goals can now be found in the care plan section) Acute Rehab PT Goals ?Patient Stated Goal: back to independence ?PT Goal Formulation: With patient ?Time For Goal Achievement:  01/16/22 ?Progress towards PT goals: Progressing toward goals ? ?  ?Frequency ? ? ? Min 3X/week ? ? ? ?  ?PT Plan Current plan remains appropriate  ? ? ?   ?AM-PAC PT "6 Clicks" Mobility   ?Outcome Measure ? Help needed turning from your back to your side while in a flat bed without using bedrails?: None ?Help needed moving from lying on your back to sitting on the side of a flat bed without using bedrails?: A Little ?Help needed moving to and from a bed to a chair (including a wheelchair)?: A Little ?Help needed standing up from a chair using your arms (e.g., wheelchair or bedside chair)?: A Little ?Help needed to walk in hospital room?: A Little ?Help needed climbing 3-5 steps with a railing? : A Little ?6 Click Score: 19 ? ?  ?End of Session Equipment Utilized During Treatment: Gait belt ?Activity Tolerance: Patient limited by pain;Other (comment) (bladder discomfort/urinary urgency limiting) ?Patient left: in chair;with call bell/phone within reach;Other (comment) (on [facility]) ?Nurse Communication: Mobility status ?PT Visit Diagnosis: Unsteadiness on feet (R26.81);Difficulty in walking, not elsewhere classified (R26.2) ?  ? ? ?Time: 1700-1710 ?PT Time Calculation (min) (ACUTE ONLY): 10 min ? ?Charges:  $Therapeutic Activity: 8-22 mins          ?          ? ?Erroll Wilbourne P., PTA ?Acute Rehabilitation Services ?Secure Chat Preferred 9a-5:30pm ?Office: [phone]  ? ? ?Angellee Cohill M Derrico Zhong ?01/06/2022, 6:23 PM ? ?

## 2022-01-06 NOTE — Progress Notes (Addendum)
? ?Progress Note ? ?Patient Name: Kristy Patterson ?Date of Encounter: 01/06/2022 ? ?[facility] Cardiologist: Andrez Grime, MD  ? ?Subjective  ? ?Patient had another episode of lower substernal/epigastric chest pressure about 1 hour ago. This was a brief episode and she did not require sublingual Nitro but she states it felt like the pain that brought her into the ED. EKG showed Normal sinus rhythm mild ST depressions and T wave inversions in leads V2-V5.  T wave changes are more prominent in V2-V3 compared to last EKG but otherwise no significant changes. She continues to have mild lower abdominal soreness. She also reports multiple episode of bright red blood per rectum after trying to have a bowel movement overnight. GI has recommended proceed with abdominal/pelvic CT scan today. ? ?Inpatient Medications  ?  ?Scheduled Meds: ? aspirin EC  81 mg Oral Daily  ? atorvastatin  80 mg Oral Daily  ? bisacodyl  10 mg Rectal Once  ? clopidogrel  75 mg Oral Q breakfast  ? furosemide  40 mg Oral Daily  ? heparin  5,000 Units Subcutaneous Q8H  ? levothyroxine  125 mcg Oral Q0600  ? magnesium hydroxide  30 mL Oral Once  ? metoprolol tartrate  25 mg Oral BID  ? sodium chloride flush  3 mL Intravenous Q12H  ? sodium chloride flush  3 mL Intravenous Q12H  ? ?Continuous Infusions: ? sodium chloride 10 mL/hr at 01/01/22 2000  ? sodium chloride 10 mL/hr at 01/01/22 0700  ? sodium chloride    ? ?PRN Meds: ?sodium chloride, sodium chloride, sodium chloride, acetaminophen, alum & mag hydroxide-simeth, bisacodyl, morphine injection, nitroGLYCERIN, ondansetron (ZOFRAN) IV, sodium chloride flush  ? ?Vital Signs  ?  ?Vitals:  ? 01/05/22 0446 01/05/22 1121 01/05/22 1948 01/06/22 0348  ?BP: (!) 135/57 120/64 107/87 132/62  ?Pulse:  87 (!) 103 93  ?Resp: 16 17 17 17   ?Temp: 97.8 ?F (36.6 ?C) 97.9 ?F (36.6 ?C) 98.4 ?F (36.9 ?C) 98.1 ?F (36.7 ?C)  ?TempSrc: Oral Oral Oral Oral  ?SpO2: 99% 99% 95% 96%  ?Weight:    80.6 kg  ?Height:       ? ? ?Intake/Output Summary (Last 24 hours) at 01/06/2022 0926 ?Last data filed at 01/06/2022 0019 ?Gross per 24 hour  ?Intake 803 ml  ?Output 350 ml  ?Net 453 ml  ? ? ?  01/06/2022  ?  3:48 AM 01/05/2022  ?  1:36 AM 01/04/2022  ? 12:57 PM  ?Last 3 Weights  ?Weight (lbs) 177 lb 9.6 oz 177 lb 6.4 oz 179 lb 7.3 oz  ?Weight (kg) 80.559 kg 80.468 kg 81.4 kg  ?   ? ?Telemetry  ?  ?Sinus rhythm with rates in the 70s to 110s. - Personally Reviewed ? ?ECG  ?  ?Normal sinus rhythm, rate 95 bpm, with RBBB and LAFB as well as mild ST depressions and T wave inversions in leads V2-V5.  T wave changes are more prominent in V2-V3 compared to last EKG but otherwise no significant changes.  - Personally Reviewed ? ?Physical Exam  ? ?GEN: 86 year old Caucasian female. No acute distress.   ?Neck: No JVD. ?Cardiac: Mildly tachycardic with normal rhythm. No murmurs, rubs, or gallops.  ?Respiratory: Clear to auscultation bilaterally. No wheezes, rhonchi, or rales. ?GI: Soft and mild tenderness with palpation of lower abdomen. Bowel sounds present. ?MS: Trace to 1+ pitting edema of bilateral lower extremities. No deformity. ?Skin: Warm and dry. ?Neuro:  No focal deficits. ?Psych:  Normal affect. Responds appropriately. ? ?Labs  ?  ?High Sensitivity Troponin:   ?Recent Labs  ?Lab 12/30/21 ?2211 12/31/21 ?0429  ?TROPONINIHS 4,097* >24,000*  ?   ?Chemistry ?Recent Labs  ?Lab 12/30/21 ?2211 12/31/21 ?0429 01/01/22 ?[id number] 01/02/22 ?[id number] 01/03/22 ?0107 01/04/22 ?0031 01/05/22 ?1016 01/06/22 ?0409  ?NA 129* 130*   < > 128*   < > 132* 134* 134*  ?K 3.3* 4.1   < > 4.4   < > 4.5 4.0 4.1  ?CL 94* 96*   < > 99   < > 103 105 105  ?CO2 23 23   < > 20*   < > 20* 24 24  ?GLUCOSE 178* 121*   < > 132*   < > 97 114* 110*  ?BUN 18 15   < > 20   < > 32* 23 24*  ?CREATININE 1.28* 1.28*   < > 1.15*   < > 1.45* 1.33* 1.41*  ?CALCIUM 8.7* 8.8*   < > 8.3*   < > 8.6* 8.8* 8.8*  ?MG  --  2.2  --  2.3  --   --   --   --   ?PROT 7.3  --   --   --   --   --   --   --   ?ALBUMIN  3.7  --   --   --   --   --   --   --   ?AST 57*  --   --   --   --   --   --   --   ?ALT 18  --   --   --   --   --   --   --   ?ALKPHOS 104  --   --   --   --   --   --   --   ?BILITOT 0.5  --   --   --   --   --   --   --   ?GFRNONAA 41* 41*   < > 47*   < > 36* 39* 37*  ?ANIONGAP 12 11   < > 9   < > 9 5 5   ? < > = values in this interval not displayed.  ?  ?Lipids  ?Recent Labs  ?Lab 12/31/21 ?0429  ?CHOL 241*  ?TRIG 104  ?HDL 58  ?LDLCALC 162*  ?CHOLHDL 4.2  ?  ?Hematology ?Recent Labs  ?Lab 01/02/22 ?[id number] 01/03/22 ?0107 01/04/22 ?0031  ?WBC 14.5* 14.0* 10.0  ?RBC 4.13 4.28 4.43  ?HGB 12.0 12.1 12.6  ?HCT 36.0 37.2 39.4  ?MCV 87.2 86.9 88.9  ?MCH 29.1 28.3 28.4  ?MCHC 33.3 32.5 32.0  ?RDW 13.9 14.2 14.4  ?PLT 147* 142* 215  ? ?Thyroid No results for input(s): TSH, FREET4 in the last 168 hours.  ?BNP ?Recent Labs  ?Lab 12/31/21 ?0429  ?BNP 326.2*  ?  ?DDimer No results for input(s): DDIMER in the last 168 hours.  ? ?Radiology  ?  ?No results found. ? ?Cardiac Studies  ? ?Left Cardiac Catheterization 12/30/2021: ?  Mid LM lesion is 80% stenosed. ?  Ost LAD lesion is 95% stenosed. ?  Prox LAD lesion is 80% stenosed. ?  Prox Cx to Mid Cx lesion is 99% stenosed. ?  Prox RCA lesion is 70% stenosed. ?  A drug-eluting stent was successfully placed using a [place] 2.75X22. ?  Post intervention, there is a 0% residual stenosis. ?  The left ventricular systolic function is normal. ?  LV end diastolic pressure is normal. ?  There is mild aortic valve stenosis. ?  ?Conclusion: ?Severe three-vessel coronary artery disease including 80% distal LMCA, 95% ostial LAD, 80% mid LAD, 70% RCA. ?Culprit lesion is 99% thrombotic occlusion of the mid left circumflex ?Elevated LVEDP of approximately 30 mmHg ?Normal left ventricular systolic function ?Successful PCI to the mid left circumflex with placement of a 2.75 x 22 mm Onyx DES with excellent angiographic result and TIMI-3 flow ?40 cc right femoral IABP placed for coronary  perfusion with plans for revascularization of the distal left main in the next few days ?  ?Recommendations: ?1.  Dual antiplatelet therapy with aspirin and Plavix for 12 months, ideally longer ?2.  Emergent transfer to [facility] with IABP in place for consideration of high risk PCI to the left main coronary artery ?3.  Recommend initiation of heparin for IABP on transfer to [facility] ?  ?Diagnostic ?Dominance: Right ?Intervention ?  ?_____________ ?  ?Echocardiogram 12/31/2021: ?Impressions: ? 1. Mild anterolateral hypokinesis. Left ventricular ejection fraction, by  ?estimation, is 50 to 55%. The left ventricle has low normal function.  ?There is moderate left ventricular hypertrophy. Left ventricular diastolic  ?parameters are consistent with  ?Grade I diastolic dysfunction (impaired relaxation).  ? 2. Right ventricular systolic function is mildly reduced. The right  ?ventricular size is normal. Tricuspid regurgitation signal is inadequate  ?for assessing PA pressure.  ? 3. The mitral valve is normal in structure. Trivial mitral valve  ?regurgitation.  ? 4. There is moderate calcification of the aortic valve. Aortic valve  ?regurgitation is not visualized.  ? 5. The inferior vena cava is normal in size with greater than 50%  ?respiratory variability, suggesting right atrial pressure of 3 mmHg.  ?_______________ ?  ?Coronary Stent Intervention 01/01/2022: ?  Mid LM to Ost LAD lesion is 80% stenosed.  After orbital atherectomy, a drug-eluting stent was successfully placed using a SYNERGY XD 3.50X16, postdilated to 4 mm.  Optimized with IVUS. ?  Post intervention, there is a 0% residual stenosis. ?  Prox LAD lesion is 80% stenosed.  After orbital atherectomy, a drug-eluting stent was successfully placed using a SYNERGY XD 3.0X16; proximal stent postdilated to 4.0 mm.  Optimized with IVUS. ?  Post intervention, there is a 0% residual stenosis. ?  Mid Circumflex stent was patent. ?  Prox RCA lesion is 70%  stenosed noted from prior cath. ?  LV end diastolic pressure is moderately elevated. ?  There is no aortic valve stenosis. ?  ?Successful orbital atherectomy and stenting of the distal left main, ostial LAD a

## 2022-01-06 NOTE — Progress Notes (Signed)
Physical Therapy Treatment ?Patient Details ?Name: Kristy Patterson ?MRN: [id number] ?DOB: December 06, 1935 ?Today's Date: 01/06/2022 ? ? ?History of Present Illness 86 y.o. F who presents 12/31/2021 with STEMI. Found to have hazy 99 % thrombotic hazy lesion in the LCx thought to be the culprit lesion.  With guide catheter found to have 80% distal LMCA, 90% ostial LAD, 80% mid LAD lesion as well as symptoms in RCA in addition to the culprit lesion. S/p PCI of LCx. Significant PMH: HTN, CKD, diverticulitis, recurrent UTIs. ? ?  ?PT Comments  ? ? PTA called back into room to assist pt off BSC as nursing staff not available at time of session. Pt at this time reporting slightly decreased abdominal pressure and agreeable to gait trial in hallway using RW. Pt needing min guard for safety due to anxiety and increased discomfort for transfers and gait with safety cues needed. Pt requesting to return to supine at end of session and per RN, staff plan to re-attempt catheter insertion later in evening so best to remain supine at this time. Pt continues to benefit from PT services to progress toward functional mobility goals. Plan to continue stair training if family present next date as schedule permits.  ?Recommendations for follow up therapy are one component of a multi-disciplinary discharge planning process, led by the attending physician.  Recommendations may be updated based on patient status, additional functional criteria and insurance authorization. ? ?Follow Up Recommendations ? Home health PT ?  ?  ?Assistance Recommended at Discharge PRN  ?Patient can return home with the following A little help with walking and/or transfers;A little help with bathing/dressing/bathroom;Assistance with cooking/housework;Assist for transportation;Help with stairs or ramp for entrance ?  ?Equipment Recommendations ? Rolling walker (2 wheels)  ?  ?Recommendations for Other Services   ? ? ?  ?Precautions / Restrictions Precautions ?Precautions:  Fall ?Restrictions ?Weight Bearing Restrictions: No  ?  ? ?Mobility ? Bed Mobility ?Overal bed mobility: Needs Assistance ?Bed Mobility: Sit to Supine ?  ?  ?Supine to sit: Min assist ?Sit to supine: Min guard, HOB elevated ?  ?General bed mobility comments: increased time/effort, use of bed rail ?  ? ?Transfers ?Overall transfer level: Needs assistance ?Equipment used: Rolling walker (2 wheels) ?Transfers: Sit to/from Stand, Bed to chair/wheelchair/BSC ?Sit to Stand: Min guard ?  ?Step pivot transfers: Min guard ?  ?  ?  ?General transfer comment: from [facility] x 5 reps with improved carryover of safe hand placement with repetition ?  ? ?Ambulation/Gait ?Ambulation/Gait assistance: Min guard ?Gait Distance (Feet): 200 Feet ?Assistive device: Rolling walker (2 wheels), Rollator (4 wheels) ?Gait Pattern/deviations: Step-through pattern, Decreased stride length ?  ?  ?Pre-gait activities: pivotal steps at bedside ~20ft and standing marching x10 reps between urination attempts. ?General Gait Details: cues for activity pacing and improved heel strike, gait speed slow variable ~ 0.3-0.5 m/s but able to increase speed slightly with cues; HR observed up to 114 bpm ? ? ?Stairs ?Stairs:  (defer, pt c/o abdominal discomfort and fatigue, daughter not present for training at time of session; plan to trial next date prior to DC) ?  ?  ?  ?  ? ? ?Wheelchair Mobility ?  ? ?Modified Rankin (Stroke Patients Only) ?  ? ? ?  ?Balance Overall balance assessment: Mild deficits observed, not formally tested ?  ?  ?  ?  ?  ?  ?  ?  ?  ?  ?  ?  ?  ?  ?  ?  ?  ?  ?  ? ?  ?  Cognition Arousal/Alertness: Awake/alert ?Behavior During Therapy: [facility] for tasks assessed/performed, Anxious ?  ?  ?  ?  ?  ?  ?  ?  ?  ?  ?  ?  ?  ?  ?  ?  ?  ?General Comments: Pt at times with mild STM deficits/confusion as to safe hand placement, pt distracted due to pressure in bladder, reports some relief after sitting on BSC. ?  ?  ? ?  ?Exercises Other Exercises ?Other  Exercises: Serial STS x 5 using arms ? ?  ?General Comments General comments (skin integrity, edema, etc.): HR to 114 bpm ?  ?  ? ?Pertinent Vitals/Pain Pain Assessment ?Pain Assessment: Faces ?Faces Pain Scale: Hurts even more ?Pain Location: discomfort in abdomen/bladder ?Pain Descriptors / Indicators: Tightness, Discomfort, Pressure ?Pain Intervention(s): Monitored during session, Repositioned  ? ? ? ?PT Goals (current goals can now be found in the care plan section) Acute Rehab PT Goals ?Patient Stated Goal: back to independence ?PT Goal Formulation: With patient ?Time For Goal Achievement: 01/16/22 ?Progress towards PT goals: Progressing toward goals ? ?  ?Frequency ? ? ? Min 3X/week ? ? ? ?  ?PT Plan Current plan remains appropriate  ? ? ?   ?AM-PAC PT "6 Clicks" Mobility   ?Outcome Measure ? Help needed turning from your back to your side while in a flat bed without using bedrails?: None ?Help needed moving from lying on your back to sitting on the side of a flat bed without using bedrails?: A Little ?Help needed moving to and from a bed to a chair (including a wheelchair)?: A Little ?Help needed standing up from a chair using your arms (e.g., wheelchair or bedside chair)?: A Little ?Help needed to walk in hospital room?: A Little ?Help needed climbing 3-5 steps with a railing? : A Little ?6 Click Score: 19 ? ?  ?End of Session Equipment Utilized During Treatment: Gait belt ?Activity Tolerance: Patient tolerated treatment well ?Patient left: with call bell/phone within reach;Other (comment);in bed;with bed alarm set (heels floated; pt refusing to sit up in recliner to eat as she has too much pain, RN aware) ?Nurse Communication: Mobility status;Other (comment) (pain score) ?PT Visit Diagnosis: Unsteadiness on feet (R26.81);Difficulty in walking, not elsewhere classified (R26.2) ?  ? ? ?Time: 1720-1745 ?PT Time Calculation (min) (ACUTE ONLY): 25 min ? ?Charges:  $Gait Training: 8-22 mins ?         ?           ? ?Olajuwon Fosdick P., PTA ?Acute Rehabilitation Services ?Secure Chat Preferred 9a-5:30pm ?Office: [phone]  ? ? ?Kristy Patterson ?01/06/2022, 6:32 PM ? ?

## 2022-01-06 NOTE — Progress Notes (Signed)
PT Cancellation Note ? ?Patient Details ?Name: Adilee Lemme ?MRN: [id number] ?DOB: 11/03/1935 ? ? ?Cancelled Treatment:    Reason Eval/Treat Not Completed: (P) Patient at procedure or test/unavailable (pt working with OT on first attempt, then off floor for CT scan.) Will continue efforts per PT plan of care as schedule permits. ? ? ?Kelty Szafran M Anabela Crayton ?01/06/2022, 3:09 PM ? ? ?

## 2022-01-06 NOTE — Plan of Care (Signed)
?  Problem: Clinical Measurements: ?Goal: Will remain free from infection ?Outcome: Progressing ?Goal: Respiratory complications will improve ?Outcome: Progressing ?Goal: Cardiovascular complication will be avoided ?Outcome: Progressing ?  ?Problem: Coping: ?Goal: Level of anxiety will decrease ?Outcome: Progressing ?  ?Problem: Elimination: ?Goal: Will not experience complications related to bowel motility ?Outcome: Progressing ?  ?Problem: Pain Managment: ?Goal: General experience of comfort will improve ?Outcome: Progressing ?  ?Problem: Safety: ?Goal: Ability to remain free from injury will improve ?Outcome: Progressing ?  ?Problem: Skin Integrity: ?Goal: Risk for impaired skin integrity will decrease ?Outcome: Progressing ?  ?Problem: Education: ?Goal: Understanding of cardiac disease, CV risk reduction, and recovery process will improve ?Outcome: Progressing ?Goal: Understanding of medication regimen will improve ?Outcome: Progressing ?  ?

## 2022-01-06 NOTE — Progress Notes (Signed)
CT abdomen and pelvis with oral and IV contrast was done today with report summary as follows: ? ?"Moderate bilateral hydroureteronephrosis likely due to a markedly ?distended urinary bladder. No obstructing lesion identified such as ?a stone. ?  ?Colonic diverticulosis.  No evidence of acute diverticulitis. ?  ?Normal appendix.  No bowel obstruction." ? ?I will message Dr. Clifton James about this so a urinary catheter can be ordered. ? ? - Amada Jupiter, MD ?   [facility] ? ? ?

## 2022-01-06 NOTE — Therapy (Signed)
Occupational Therapy Treatment ?Patient Details ?Name: Kristy Patterson ?MRN: [id number] ?DOB: 30-Dec-1935 ?Today's Date: 01/06/2022 ? ? ?History of present illness 86 y.o. F who presents 12/31/2021 with STEMI. Found to have hazy 99 % thrombotic hazy lesion in the LCx thought to be the culprit lesion.  With guide catheter found to have 80% distal LMCA, 90% ostial LAD, 80% mid LAD lesion as well as symptoms in RCA in addition to the culprit lesion. S/p PCI of LCx. Significant PMH: HTN, CKD, diverticulitis, recurrent UTIs. ?  ?OT comments ? Pt received seated EOB. She reports she is having some continued abdominal discomfort and recently received tylenol to help with discomfort. Pt's daughter at bedside and discussing modifications to ADLs, recommended equip and medication management. Daughter and pt both receptive of teaching and expressing appreciation for education. Contacted Case manager regarding need for [facility] as well as daughter reporting she has not heard from home health agency regarding start of HHPT and HHOT services prior to d/c. Will continue to follow acutely to progress indep with ADLs.  ? ?Recommendations for follow up therapy are one component of a multi-disciplinary discharge planning process, led by the attending physician.  Recommendations may be updated based on patient status, additional functional criteria and insurance authorization. ?   ?Follow Up Recommendations ? Home health OT  ?  ?Assistance Recommended at Discharge Frequent or constant Supervision/Assistance  ?Patient can return home with the following ? Assist for transportation;Direct supervision/assist for medications management;A little help with walking and/or transfers;A little help with bathing/dressing/bathroom ?  ?Equipment Recommendations ? BSC/3in1  ?  ?   ?Precautions / Restrictions Precautions ?Precautions: Fall  ? ? ?  ? ?Mobility Bed Mobility ?  ?  ?  ?General bed mobility comments: received seated EOB ?  ? ? ?  ?  ?   ? ?ADL either  performed or assessed with clinical judgement  ? ?ADL   ?  ? Education in modified uses for [facility] as well as adaptation to only half bath accessibility once d/c to daughter's home. Educated in fall prevention strategies with pt independently identifying use of night lights for night time toileting needs. Discussed where to acquire [facility] if not covered by insurance as well as how to seek reimbursement if [facility] cannot be delivered prior to d/c from hospital. Pt and daughter verbalized understanding. Education in role of OT and importance of HHOT follow-up.  ? ?Medication management: pt reports she no longer feels she can keep up with her medications without a comprehensive list. Encouraged this continued practice with a copy to remain in her purse to ensure it is with her at all times. Additionally educated pt and daughter in different types of pill box with multiple slots for each day to accommodate medications taken multiple times during the day or with morning or evening meals.  ?  ? ? ? ?Cognition Arousal/Alertness: Awake/alert ?Behavior During Therapy: [facility] for tasks assessed/performed ?  ?  ?  ?  ?   ?   ?   ?   ? ? ?Pertinent Vitals/ Pain       Pain Assessment ?Pain Assessment: 0-10 ?Pain Score: 4  ?Pain Location: discomfort in abdomen ? ?   ?   ? ?Frequency ? Min 2X/week  ? ? ? ? ?  ?Progress Toward Goals ? ?OT Goals(current goals can now be found in the care plan section) ? Progress towards OT goals: Progressing toward goals ? ?Acute Rehab OT Goals ?Patient Stated Goal: d/c home with  daughter ?OT Goal Formulation: With patient ?Potential to Achieve Goals: Good  ?Plan Discharge plan remains appropriate   ? ?   ?AM-PAC OT "6 Clicks" Daily Activity     ?Outcome Measure ? ? Help from another person eating meals?: None ?Help from another person taking care of personal grooming?: None ?Help from another person toileting, which includes using toliet, bedpan, or urinal?: A Little ?Help from another person bathing (including  washing, rinsing, drying)?: A Little ?Help from another person to put on and taking off regular upper body clothing?: A Little ?Help from another person to put on and taking off regular lower body clothing?: A Little ?6 Click Score: 20 ? ?  ?End of Session   ? ?OT Visit Diagnosis: Other abnormalities of gait and mobility (R26.89);Unsteadiness on feet (R26.81);Pain ?  ?Activity Tolerance No increased pain ?  ?Patient Left with family/visitor present;Other (comment) (sitting EOB) ?  ?Nurse Communication Other (comment) (request for medications to be filled prior to d/c) ?  ? ?   ? ?Time: 1425-1450 ?OT Time Calculation (min): 25 min ? ?Charges: OT General Charges ?$OT Visit: 1 Visit ?OT Treatments ?$Self Care/Home Management : 23-37 mins ? ? ?Daryl Eastern, OTR/L ?01/06/2022, 2:58 PM ? ? ?

## 2022-01-07 LAB — BASIC METABOLIC PANEL
Anion gap: 9 (ref 5–15)
BUN: 20 mg/dL (ref 8–23)
CO2: 23 mmol/L (ref 22–32)
Calcium: 8.9 mg/dL (ref 8.9–10.3)
Chloride: 104 mmol/L (ref 98–111)
Creatinine, Ser: 1.5 mg/dL — ABNORMAL HIGH (ref 0.44–1.00)
GFR, Estimated: 34 mL/min — ABNORMAL LOW (ref >60)
Glucose, Bld: 108 mg/dL — ABNORMAL HIGH (ref 70–99)
Potassium: 4.1 mmol/L (ref 3.5–5.1)
Sodium: 136 mmol/L (ref 135–145)

## 2022-01-07 NOTE — Progress Notes (Signed)
Mobility Specialist Progress Note: ? ? 01/07/22 1100  ?Mobility  ?Activity Ambulated with assistance in hallway  ?Level of Assistance Standby assist, set-up cues, supervision of patient - no hands on  ?Assistive Device Front wheel walker  ?Distance Ambulated (ft) 300 ft  ?Activity Response Tolerated well  ?$Mobility charge 1 Mobility  ? ?Pt eager for mobility session. Required minG throughout ambulation. No c/o throughout session. Pt back in room, with NT present to assist with bath.  ? ?Addison Lank ?Acute Rehab ?Phone: [phone] ?Office Phone: [phone] ? ?

## 2022-01-07 NOTE — Progress Notes (Signed)
No diverticulitis, but urinary retention seen on CTAP - results communicated to Cardiology yesterday afternoon. ? ?Patient should follow up with her [facility] clinic GI physician after discharge re: chronic constipation. ? ?Our service is signing off - thanks for involving [place] in her care. ? ?-  - Amada Jupiter, MD ?   [facility] ? ?

## 2022-01-07 NOTE — Progress Notes (Signed)
Pt agreed to removed coude. At same time NP came to educate patient on the reasons the foley should be removed. Pt was compliant and understood. Pt tolerated removal well. Denies pain. ?

## 2022-01-07 NOTE — Progress Notes (Signed)
Mobility Specialist Progress Note: ? ? 01/07/22 1650  ?Mobility  ?Activity Ambulated with assistance in hallway  ?Level of Assistance Standby assist, set-up cues, supervision of patient - no hands on  ?Assistive Device Front wheel walker  ?Distance Ambulated (ft) 550 ft  ?Activity Response Tolerated well  ?$Mobility charge 1 Mobility  ? ?Met pt ambulating in hallway with RW. Ambulated with no c/o throughout. Pt back in room with all needs met. ? ?Nelta Numbers ?Acute Rehab ?Secure Chat or ?Office Phone: [phone] ? ?

## 2022-01-07 NOTE — Progress Notes (Signed)
? ?Progress Note ? ?Patient Name: Kristy Patterson ?Date of Encounter: 01/07/2022 ? ?[facility] Cardiologist: Andrez Grime, MD  ? ?Subjective  ? ?She feels better today. NO chest pain. Abdominal pain is better ? ?Inpatient Medications  ?  ?Scheduled Meds: ? aspirin EC  81 mg Oral Daily  ? atorvastatin  80 mg Oral Daily  ? bisacodyl  10 mg Rectal Once  ? Chlorhexidine Gluconate Cloth  6 each Topical Daily  ? clopidogrel  75 mg Oral Q breakfast  ? furosemide  40 mg Oral Daily  ? heparin  5,000 Units Subcutaneous Q8H  ? isosorbide mononitrate  15 mg Oral Daily  ? levothyroxine  125 mcg Oral Q0600  ? lidocaine  1 application. Urethral Once  ? magnesium hydroxide  30 mL Oral Once  ? metoprolol tartrate  50 mg Oral BID  ? sodium chloride flush  3 mL Intravenous Q12H  ? sodium chloride flush  3 mL Intravenous Q12H  ? ?Continuous Infusions: ? sodium chloride 10 mL/hr at 01/01/22 2000  ? sodium chloride 10 mL/hr at 01/01/22 0700  ? sodium chloride    ? ?PRN Meds: ?sodium chloride, sodium chloride, sodium chloride, acetaminophen, alum & mag hydroxide-simeth, bisacodyl, nitroGLYCERIN, ondansetron (ZOFRAN) IV, sodium chloride flush  ? ?Vital Signs  ?  ?Vitals:  ? 01/06/22 1955 01/06/22 2202 01/07/22 0526 01/07/22 0718  ?BP: 140/65 (!) 152/69 117/69 (!) 119/53  ?Pulse: 87 (!) 106 87 83  ?Resp: 18 18 19 18   ?Temp: 98.3 ?F (36.8 ?C) 98.2 ?F (36.8 ?C) 98.2 ?F (36.8 ?C) 97.6 ?F (36.4 ?C)  ?TempSrc: Oral Oral Oral Oral  ?SpO2: 100% 100%  98%  ?Weight:   79 kg   ?Height:   5\' 3"  (1.6 m)   ? ? ?Intake/Output Summary (Last 24 hours) at 01/07/2022 0956 ?Last data filed at 01/07/2022 [id number] ?Gross per 24 hour  ?Intake 2185.58 ml  ?Output 2950 ml  ?Net -764.42 ml  ? ? ?  01/07/2022  ?  5:26 AM 01/06/2022  ?  3:48 AM 01/05/2022  ?  1:36 AM  ?Last 3 Weights  ?Weight (lbs) 174 lb 3.2 oz 177 lb 9.6 oz 177 lb 6.4 oz  ?Weight (kg) 79.017 kg 80.559 kg 80.468 kg  ?   ? ?Telemetry  ?  ?sinus - Personally Reviewed ? ?ECG  ?  ?No AM EKG ? ?Physical Exam   ? ?General: Well developed, well nourished, NAD  ?HEENT: OP clear, mucus membranes moist  ?SKIN: warm, dry. No rashes. ?Neuro: No focal deficits  ?Musculoskeletal: Muscle strength 5/5 all ext  ?Psychiatric: Mood and affect normal  ?Neck: No JVD ?Lungs:Clear bilaterally, no wheezes, rhonci, crackles ?Cardiovascular: Regular rate and rhythm. No murmurs, gallops or rubs. ?Abdomen:Soft. Bowel sounds present. Non-tender.  ?Extremities: No lower extremity edema.  ? ?Labs  ?  ?High Sensitivity Troponin:   ?Recent Labs  ?Lab 12/30/21 ?2211 12/31/21 ?0429  ?TROPONINIHS 4,097* >24,000*  ?   ?Chemistry ?Recent Labs  ?Lab 01/02/22 ?[id number] 01/03/22 ?0107 01/04/22 ?0031 01/05/22 ?1016 01/06/22 ?0409  ?NA 128*   < > 132* 134* 134*  ?K 4.4   < > 4.5 4.0 4.1  ?CL 99   < > 103 105 105  ?CO2 20*   < > 20* 24 24  ?GLUCOSE 132*   < > 97 114* 110*  ?BUN 20   < > 32* 23 24*  ?CREATININE 1.15*   < > 1.45* 1.33* 1.41*  ?CALCIUM 8.3*   < > 8.6* 8.8*  8.8*  ?MG 2.3  --   --   --   --   ?GFRNONAA 47*   < > 36* 39* 37*  ?ANIONGAP 9   < > 9 5 5   ? < > = values in this interval not displayed.  ?  ?Lipids  ?No results for input(s): CHOL, TRIG, HDL, LABVLDL, LDLCALC, CHOLHDL in the last 168 hours. ?  ?Hematology ?Recent Labs  ?Lab 01/02/22 ?[id number] 01/03/22 ?0107 01/04/22 ?0031  ?WBC 14.5* 14.0* 10.0  ?RBC 4.13 4.28 4.43  ?HGB 12.0 12.1 12.6  ?HCT 36.0 37.2 39.4  ?MCV 87.2 86.9 88.9  ?MCH 29.1 28.3 28.4  ?MCHC 33.3 32.5 32.0  ?RDW 13.9 14.2 14.4  ?PLT 147* 142* 215  ? ?Thyroid No results for input(s): TSH, FREET4 in the last 168 hours.  ?BNP ?No results for input(s): BNP, PROBNP in the last 168 hours. ?  ?DDimer No results for input(s): DDIMER in the last 168 hours.  ? ?Radiology  ?  ?CT ABDOMEN PELVIS W CONTRAST ? 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 RADIATION DOSE REDUCTION: This exam was performed according to the departmental dose-optimization program which includes automated exposure control, adjustment of the mA and/or kV according to patient size and/or use of iterative reconstruction technique. CONTRAST:  15mL OMNIPAQUE IOHEXOL 300 MG/ML  SOLN COMPARISON:  CT 06/03/2021 FINDINGS: Lower chest: No acute abnormality. Hepatobiliary: No focal liver abnormality is seen. The gallbladder is unremarkable. Pancreas: Unremarkable. No pancreatic ductal dilatation or surrounding inflammatory changes. Spleen: Normal in size without focal abnormality. Adrenals/Urinary Tract: Adrenal glands are unremarkable. There is moderate bilateral hydroureteronephrosis likely due to a markedly distended urinary bladder. There is no obstructing stone visualized. Stable bilateral renal cysts. Stomach/Bowel: Small hiatal hernia. The stomach is otherwise within normal limits. There is no evidence of bowel obstruction.The appendix is normal. Extensive colonic diverticulosis. No evidence of acute diverticulitis. Vascular/Lymphatic: Aortoiliac atherosclerosis. No AAA. No lymphadenopathy. Reproductive: Prior hysterectomy. Small punctate foci of gas within the vaginal cuff. Other: No bowel containing hernia.  No ascites.  No free air Musculoskeletal: No acute osseous abnormality. No suspicious lytic or blastic lesions. Multilevel degenerative changes of the spine. There is mild focal linear stranding in the right groin anterior to the femoral vessels new from prior exam. IMPRESSION: Moderate bilateral hydroureteronephrosis likely due to a markedly distended urinary bladder. No obstructing lesion identified such as a stone. Colonic diverticulosis.  No evidence of acute diverticulitis. Normal appendix.  No bowel obstruction. Electronically Signed   By: Maurine Simmering M.D.    On: 01/06/2022 15:50   ? ?Cardiac Studies  ? ?Left Cardiac Catheterization 12/30/2021: ?  Mid LM lesion is 80% stenosed. ?  Ost LAD lesion is 95% stenosed. ?  Prox LAD lesion is 80% stenosed. ?  Prox Cx to Mid Cx lesion is 99% stenosed. ?  Prox RCA lesion is 70% stenosed. ?  A drug-eluting stent was successfully placed using a Crystal [id number]. ?  Post intervention, there is a 0% residual stenosis. ?  The left ventricular systolic function is normal. ?  LV end diastolic pressure is normal. ?  There is mild aortic valve stenosis. ?  ?Conclusion: ?Severe three-vessel coronary artery disease including 80% distal LMCA, 95% ostial LAD,  80% mid LAD, 70% RCA. ?Culprit lesion is 99% thrombotic occlusion of the mid left circumflex ?Elevated LVEDP of approximately 30 mmHg ?Normal left ventricular systolic function ?Successful PCI to the mid left circumflex with placement of a 2.75 x 22 mm Onyx DES with excellent angiographic result and TIMI-3 flow ?40 cc right femoral IABP placed for coronary perfusion with plans for revascularization of the distal left main in the next few days ?  ?Recommendations: ?1.  Dual antiplatelet therapy with aspirin and Plavix for 12 months, ideally longer ?2.  Emergent transfer to [facility] with IABP in place for consideration of high risk PCI to the left main coronary artery ?3.  Recommend initiation of heparin for IABP on transfer to [facility] ?  ?Diagnostic ?Dominance: Right ?Intervention ?  ?_____________ ?  ?Echocardiogram 12/31/2021: ?Impressions: ? 1. Mild anterolateral hypokinesis. Left ventricular ejection fraction, by  ?estimation, is 50 to 55%. The left ventricle has low normal function.  ?There is moderate left ventricular hypertrophy. Left ventricular diastolic  ?parameters are consistent with  ?Grade I diastolic dysfunction (impaired relaxation).  ? 2. Right ventricular systolic function is mildly reduced. The right  ?ventricular size is normal. Tricuspid  regurgitation signal is inadequate  ?for assessing PA pressure.  ? 3. The mitral valve is normal in structure. Trivial mitral valve  ?regurgitation.  ? 4. There is moderate calcification of the aortic valve. Ao

## 2022-01-07 NOTE — Progress Notes (Signed)
Late entry     01/06/22 ?Yesterday Dr. Julianne Handler gave an order to insert a foley after the CT of the abdomen was completed. An attempt was made to insert a 16 [place] Foley and a bladder scan was done that showed over 998 mL of urine were in the bladder. An attempt was made to do and in and out cath but that was unsuccessful as well. ? ?After attempting to do both an IN an OUT intermittent cath and place a 16 French foley, doctor was notified of the unsuccessful attempts. ?At 51 Dr. Julianne Handler gave me a verbal order to place a coude Foleybecause the patient had stated that they had pelvic surgery in the past. The special team of nurses were called to place the coude. Placement was successful and patient voided over 1350 ml immediately.  ? ?Patient no longer had any pain and felt relief from the pressure and tightness in the hypogastric region.  ?

## 2022-01-07 NOTE — Progress Notes (Signed)
Physical Therapy Treatment ?Patient Details ?Name: Kristy Patterson ?MRN: [id number] ?DOB: 08/31/1935 ?Today's Date: 01/07/2022 ? ? ?History of Present Illness 86 y.o. F who presents 12/31/2021 with STEMI. Found to have hazy 99 % thrombotic hazy lesion in the LCx thought to be the culprit lesion.  With guide catheter found to have 80% distal LMCA, 90% ostial LAD, 80% mid LAD lesion as well as symptoms in RCA in addition to the culprit lesion. S/p PCI of LCx. Significant PMH: HTN, CKD, diverticulitis, recurrent UTIs. ? ?  ?PT Comments  ? ? Treatment limited today secondary to pt reporting fatigue/weakness after not sleeping well and already amb x 30 min per patient.  Agreeable to transfer to [facility] only at this time and is able to do so with supervision/mod I.  Pt reports she wants to wait to do further stair training until her daughter can be present, but unfortunately she has told her daughter not to come today as she lives 1 hr away and pt no longer believes she will be D/Cing today due to new foley catheter.  PT to continue to f/u to encourage functional mobility and verify safety with stairs.  ?Recommendations for follow up therapy are one component of a multi-disciplinary discharge planning process, led by the attending physician.  Recommendations may be updated based on patient status, additional functional criteria and insurance authorization. ? ?Follow Up Recommendations ? Home health PT ?  ?  ?Assistance Recommended at Discharge Set up Supervision/Assistance  ?Patient can return home with the following A little help with walking and/or transfers;Assistance with cooking/housework;Help with stairs or ramp for entrance ?  ?Equipment Recommendations ? Rolling walker (2 wheels)  ?  ?Recommendations for Other Services   ? ? ?  ?Precautions / Restrictions Precautions ?Precautions: Fall  ?  ? ?Mobility ? Bed Mobility ?  ?  ?  ?  ?  ?  ?  ?  ?  ? ?Transfers ?Overall transfer level: Needs assistance ?Equipment used: Rolling walker  (2 wheels) ?Transfers: Sit to/from Stand ?Sit to Stand: Supervision ?Stand pivot transfers: Modified independent (Device/Increase time) ?  ?  ?  ?  ?General transfer comment: Pt performs sit > stand with supervision and takes steps to commode with mod I.  VCs for safety with hand placement and pt educated on hanging foley on RW and how to properly use clip to prevent foley from getting tangled or tripping patient.  Demos understanding.   Pt reports she has been transferring herself back and forth to chair and feels comfortable transferring herself back to bed after use of commode. ?  ? ?Ambulation/Gait ?  ?  ?  ?  ?  ?  ?  ?  ? ? ?Stairs ?  ?  ?  ?  ?  ? ? ?Wheelchair Mobility ?  ? ?Modified Rankin (Stroke Patients Only) ?  ? ? ?  ?Balance Overall balance assessment: Mild deficits observed, not formally tested ?  ?  ?  ?  ?  ?  ?  ?  ?  ?  ?  ?  ?  ?  ?  ?  ?  ?  ?  ? ?  ?Cognition Arousal/Alertness: Awake/alert ?Behavior During Therapy: [facility] for tasks assessed/performed ?Overall Cognitive Status: Within Functional Limits for tasks assessed ?  ?  ?  ?  ?  ?  ?  ?  ?  ?  ?  ?  ?  ?  ?  ?  ?General Comments: Pt  is sitting up EOB when PT arrives.  States she is feeling tired and weak and has been up since 4:30 am.  Reports that ambulated x 30 min in room earlier by herself and is ready to rest.  PT educates pt that there was a note in her chart about working on stairs prior to D/Cing home, but pt states she already practiced that with HHA and feels good with it.  Pt also reports that she wanted her daughter present for the next time she did stairs, but that she has told her daughter not to come today because she no longer believes she is going to be D/C'd today with the catheter and her daughter lives 1 hr away.  Pt does request that PT assist her with getting to [facility]. ?  ?  ? ?  ?Exercises Other Exercises ?Other Exercises: Pt declines to do any therex at this time. ? ?  ?General Comments General comments (skin  integrity, edema, etc.): Pt has questions re: HHPT and PT reaches out to CM to help answer questions. ?  ?  ? ?Pertinent Vitals/Pain Pain Assessment ?Pain Assessment: Faces ?Faces Pain Scale: Hurts a little bit ?Pain Location: Patient reports a little residual vaginal pain from insertion of foley catheter. ?Pain Descriptors / Indicators: Discomfort ?Pain Intervention(s): Limited activity within patient's tolerance  ? ? ?Home Living   ?  ?  ?  ?  ?  ?  ?  ?  ?  ?   ?  ?Prior Function    ?  ?  ?   ? ?PT Goals (current goals can now be found in the care plan section) Progress towards PT goals: Progressing toward goals ? ?  ?Frequency ? ? ? Min 3X/week ? ? ? ?  ?PT Plan Current plan remains appropriate  ? ? ?Co-evaluation   ?  ?  ?  ?  ? ?  ?AM-PAC PT "6 Clicks" Mobility   ?Outcome Measure ? Help needed turning from your back to your side while in a flat bed without using bedrails?: A Little ?Help needed moving from lying on your back to sitting on the side of a flat bed without using bedrails?: A Little ?Help needed moving to and from a bed to a chair (including a wheelchair)?: None ?Help needed standing up from a chair using your arms (e.g., wheelchair or bedside chair)?: A Little ?Help needed to walk in hospital room?: A Little ?Help needed climbing 3-5 steps with a railing? : A Little ?6 Click Score: 19 ? ?  ?End of Session   ?Activity Tolerance: Patient tolerated treatment well ?Patient left: Other (comment);with call bell/phone within reach (on [facility]) ?  ?  ?  ? ? ?Time: 8127-5170 ?PT Time Calculation (min) (ACUTE ONLY): 11 min ? ?Charges:  $Therapeutic Activity: 8-22 mins          ?          ? ?Shaylynn Nulty A. Njeri Vicente, PT, DPT ?Acute Rehabilitation Services ?Office: [phone]  ? ? ?Brian Zeitlin A Chloris Marcoux ?01/07/2022, 9:36 AM ? ?

## 2022-01-07 NOTE — Progress Notes (Addendum)
Called to see the patient at bedside, she had refused Foley discontinuation all day for void trial. She states she is worried about failure from void trial and thinking if Foley can be maintained. 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 She was constipated but did move her bowel twice since yesterday.  ? ?CTAP 01/06/22 showed Moderate bilateral hydroureteronephrosis likely due to a markedly distended urinary bladder. No obstructing lesion identified such as a stone. No other acute findings.  ? ?BMP today with overall stable Cr 1.5 , slightly rising from her baseline CKD III 1.3-1.4 ranges.  ? ?Discussed with the patient that void trial is appropriate for her care, she was urinating without issue prior to this admission, CTAP without obstructing stones, constipation has resolved, encouraged the patient to  ambulate and recurrently attempt to urinate, would best to avoid discharge with Foley as there is high risk of CAUTI. If she continue to not able void in the next 12 hours, we will have to re-insert Foley. She is agreeable with above plan.  ? ?Curbside urology later today, doubt retention can be resolved, recommend discharge with Foley if patient can't urinate and refer to urology outpatient for follow up, no medical therapy or urgent intervention recommended at this time.  ? ? ?

## 2022-01-08 ENCOUNTER — Telehealth: Payer: Self-pay | Admitting: *Deleted

## 2022-01-08 ENCOUNTER — Other Ambulatory Visit (HOSPITAL_COMMUNITY): Payer: Self-pay

## 2022-01-08 DIAGNOSIS — N3289 Other specified disorders of bladder: Secondary | ICD-10-CM

## 2022-01-08 DIAGNOSIS — I5031 Acute diastolic (congestive) heart failure: Secondary | ICD-10-CM

## 2022-01-08 DIAGNOSIS — N1330 Unspecified hydronephrosis: Secondary | ICD-10-CM

## 2022-01-08 DIAGNOSIS — R339 Retention of urine, unspecified: Secondary | ICD-10-CM

## 2022-01-08 LAB — BASIC METABOLIC PANEL
Anion gap: 12 (ref 5–15)
BUN: 17 mg/dL (ref 8–23)
CO2: 23 mmol/L (ref 22–32)
Calcium: 9.1 mg/dL (ref 8.9–10.3)
Chloride: 99 mmol/L (ref 98–111)
Creatinine, Ser: 1.54 mg/dL — ABNORMAL HIGH (ref 0.44–1.00)
GFR, Estimated: 33 mL/min — ABNORMAL LOW (ref >60)
Glucose, Bld: 108 mg/dL — ABNORMAL HIGH (ref 70–99)
Potassium: 3.9 mmol/L (ref 3.5–5.1)
Sodium: 134 mmol/L — ABNORMAL LOW (ref 135–145)

## 2022-01-08 MED ORDER — ISOSORBIDE MONONITRATE ER 30 MG PO TB24
15.0000 mg | ORAL_TABLET | Freq: Every day | ORAL | 2 refills | Status: DC
  Filled 2022-01-08: qty 15, 30d supply, fill #0

## 2022-01-08 MED ORDER — NITROGLYCERIN 0.4 MG SL SUBL
0.4000 mg | SUBLINGUAL_TABLET | SUBLINGUAL | 2 refills | Status: AC | PRN
  Filled 2022-01-08: qty 25, 14d supply, fill #0

## 2022-01-08 MED ORDER — FUROSEMIDE 40 MG PO TABS
40.0000 mg | ORAL_TABLET | Freq: Every day | ORAL | 2 refills | Status: DC
  Filled 2022-01-08: qty 30, 30d supply, fill #0

## 2022-01-08 MED ORDER — PANTOPRAZOLE SODIUM 40 MG PO TBEC
40.0000 mg | DELAYED_RELEASE_TABLET | Freq: Every day | ORAL | 2 refills | Status: AC
  Filled 2022-01-08: qty 30, 30d supply, fill #0

## 2022-01-08 MED ORDER — LIDOCAINE HCL URETHRAL/MUCOSAL 2 % EX GEL
1.0000 "application " | Freq: Once | CUTANEOUS | Status: AC
  Administered 2022-01-08: 1 via URETHRAL
  Filled 2022-01-08: qty 6

## 2022-01-08 MED ORDER — POTASSIUM CHLORIDE 20 MEQ PO PACK
20.0000 meq | PACK | Freq: Once | ORAL | Status: AC
  Administered 2022-01-08: 20 meq via ORAL
  Filled 2022-01-08: qty 1

## 2022-01-08 MED ORDER — METOPROLOL TARTRATE 50 MG PO TABS
50.0000 mg | ORAL_TABLET | Freq: Two times a day (BID) | ORAL | 2 refills | Status: DC
Start: 2022-01-08 — End: 2022-03-29
  Filled 2022-01-08: qty 60, 30d supply, fill #0

## 2022-01-08 MED ORDER — CLOPIDOGREL BISULFATE 75 MG PO TABS
75.0000 mg | ORAL_TABLET | Freq: Every day | ORAL | 3 refills | Status: AC
  Filled 2022-01-08: qty 90, 90d supply, fill #0

## 2022-01-08 MED ORDER — PANTOPRAZOLE SODIUM 40 MG PO TBEC
40.0000 mg | DELAYED_RELEASE_TABLET | Freq: Every day | ORAL | Status: DC
  Administered 2022-01-08: 40 mg via ORAL
  Filled 2022-01-08: qty 1

## 2022-01-08 MED ORDER — ASPIRIN 81 MG PO TBEC
81.0000 mg | DELAYED_RELEASE_TABLET | Freq: Every day | ORAL | 3 refills | Status: AC
  Filled 2022-01-08: qty 90, 90d supply, fill #0

## 2022-01-08 MED ORDER — ATORVASTATIN CALCIUM 80 MG PO TABS
80.0000 mg | ORAL_TABLET | Freq: Every day | ORAL | 2 refills | Status: DC
  Filled 2022-01-08: qty 30, 30d supply, fill #0

## 2022-01-08 NOTE — Plan of Care (Signed)

## 2022-01-08 NOTE — Progress Notes (Signed)
Pt on toilet, c/o pressure from urine retention. Has been ambulating well with staff and independently with RW in room. Reviewed exercise at home and CRPII.  ?[phone] ?Ethelda Chick CES, ACSM ?9:21 AM ?01/08/2022 ? ?

## 2022-01-08 NOTE — Progress Notes (Signed)
Mobility Specialist Progress Note: ? ? 01/08/22 0900  ?Mobility  ?Activity Ambulated independently in room  ?Level of Assistance Standby assist, set-up cues, supervision of patient - no hands on  ?Assistive Device None  ?Distance Ambulated (ft) 100 ft  ?Activity Response Tolerated well  ?$Mobility charge 1 Mobility  ? ?Pt received ambulating in room with no AD. In poor spirits this am about having to put catheter back in. Declined hallway ambulation at this time. Pt left in room with urology team present.  ? ?Addison Lank ?Acute Rehab ?Secure Chat or ?Office Phone: [phone] ? ?

## 2022-01-08 NOTE — Telephone Encounter (Signed)
-----   Message from Corrin Parker, [place] sent at 01/08/2022  1:58 PM EDT ----- ?Regarding: [facility] Follow-Up ?Patient was discharged today after being admitted for  STEMI. She needs a TOC visit. She already has an appointment with Ward Givens, NP, scheduled for 01/13/2022. She just needs a TOC phone call. ? ?Thank you! ?Callie ? ?

## 2022-01-08 NOTE — TOC Transition Note (Signed)
Transition of Care (TOC) - CM/SW Discharge Note ? ? ?Patient Details  ?Name: Kristy Patterson ?MRN: [id number] ?Date of Birth: 07/12/1936 ? ?Transition of Care (TOC) CM/SW Contact:  ?Leone Haven, RN ?Phone Number: ?01/08/2022, 10:50 AM ? ? ?Clinical Narrative:    ?NCM spoke with patient, offered choice, she has no preference, NCM made referral to [facility] for HHPT,  she will be going to her daughter's address of [address] [place] [address].  She will also need a rolling walker , she is ok with [employer] supplying this for her. NCM made referral to [facility] with [employer]. Daughter will transport her home at dc.  Staff RN needs to put in foley prior to discharge today. Daughter is at bedside. ?  ? ? ?Final next level of care: Home w Home Health Services ?Barriers to Discharge: No Barriers Identified ? ? ?Patient Goals and CMS Choice ?Patient states their goals for this hospitalization and ongoing recovery are:: return to daughters home ?CMS Medicare.gov Compare Post Acute Care list provided to:: Patient ?Choice offered to / list presented to : Patient ? ?Discharge Placement ?  ?           ?  ?  ?  ?  ? ?Discharge Plan and Services ?  ?  ?           ?DME Arranged: Walker rolling ?DME Agency: [employer] ?Date DME Agency Contacted: 01/05/22 ?Time DME Agency Contacted: 1624 ?Representative spoke with at DME Agency: Leavy Cella ?HH Arranged: PT,OT, RN ?HH Agency: [facility] ?Date HH Agency Contacted: 01/05/22 ?Time HH Agency Contacted: 1625 ?Representative spoke with at [facility] Agency: Kandee Keen ? ?Social Determinants of Health (SDOH) Interventions ?  ? ? ?Readmission Risk Interventions ?   ? View : No data to display.  ?  ?  ?  ? ? ? ? ? ?

## 2022-01-08 NOTE — Progress Notes (Signed)
Pt alert and oriented x4. Pt output 75 mL during shift. Pt bladder scanned; approximately 838 mL. MD notified. Pt aware and verbalized understanding teaching.  ?

## 2022-01-08 NOTE — Care Management Important Message (Signed)
Important Message ? ?Patient Details  ?Name: Kristy Patterson ?MRN: [id number] ?Date of Birth: [birth year] ? ? ?Medicare Important Message Given:  Yes ? ? ? ? ?Renie Ora ?01/08/2022, 7:44 AM ?

## 2022-01-08 NOTE — Discharge Instructions (Addendum)
Medications Changes: ?- START Aspirin 81mg  daily and Plavix 75mg  daily. These medications are very important in helping keep the new stents in your heart open. ?- START Isosorbide mononitrate (Imdur) 15mg  daily. This helps with residual chest pain. ?- START Atorvastatin (Lipitor) 80mg  daily. This helps with your cholesterol. ?- START Lasix 40mg  daily. This is a diuretic and helps keep fluid off. ?- STOP Diltiazem and START Metoprolol tartrate (Lopressor) 50mg  twice daily instead. This medication is better after having a heart attack.  ?- STOP Omeprazole and START Protonix 40mg  daily instead for reflux. ?- STOP Losartan. ? ?Please continue all other home medications as described elsewhere on your paperwork. ? ?Post STEMI: ?NO HEAVY LIFTING X 4 WEEKS. ?NO SEXUAL ACTIVITY X 4 WEEKS. ?NO DRIVING X 2 WEEKS. ?NO SOAKING BATHS, HOT TUBS, POOLS, ETC., X 7 DAYS. ? ?Radial Site Care: ?Refer to this sheet in the next few weeks. These instructions provide you with information on caring for yourself after your procedure. Your caregiver may also give you more specific instructions. Your treatment has been planned according to current medical practices, but problems sometimes occur. Call your caregiver if you have any problems or questions after your procedure. ?HOME CARE INSTRUCTIONS ?You may shower the day after the procedure. Remove the bandage (dressing) and gently wash the site with plain soap and water. Gently pat the site dry.  ?Do not apply powder or lotion to the site.  ?Do not submerge the affected site in water for 3 to 5 days.  ?Inspect the site at least twice daily.  ?Do not flex or bend the affected arm for 24 hours.  ?No lifting over 5 pounds (2.3 kg) for 5 days after your procedure.  ?Do not drive home if you are discharged the same day of the procedure. Have someone else drive you.  ?What to expect: ?Any bruising will usually fade within 1 to 2 weeks.  ?Blood that collects in the tissue (hematoma) may be painful  to the touch. It should usually decrease in size and tenderness within 1 to 2 weeks.  ?SEEK IMMEDIATE MEDICAL CARE IF: ?You have unusual pain at the radial site.  ?You have redness, warmth, swelling, or pain at the radial site.  ?You have drainage (other than a small amount of blood on the dressing).  ?You have chills.  ?You have a fever or persistent symptoms for more than 72 hours.  ?You have a fever and your symptoms suddenly get worse.  ?Your arm becomes pale, cool, tingly, or numb.  ?You have heavy bleeding from the site. Hold pressure on the site.  ? ?

## 2022-01-12 NOTE — Telephone Encounter (Signed)
Left voicemail message on both home and mobile numbers to confirm appointment for tomorrow here in our office at 10:05 am with instructions to call back if they should have any questions.  ?

## 2022-01-13 ENCOUNTER — Ambulatory Visit: Payer: Federal, State, Local not specified - PPO | Admitting: Nurse Practitioner

## 2022-01-13 ENCOUNTER — Telehealth (HOSPITAL_COMMUNITY): Payer: Self-pay

## 2022-01-13 ENCOUNTER — Other Ambulatory Visit
Admission: RE | Admit: 2022-01-13 | Discharge: 2022-01-13 | Disposition: A | Discharge status: Home / Self Care | Payer: Federal, State, Local not specified - PPO | Attending: Nurse Practitioner | Admitting: Nurse Practitioner

## 2022-01-13 ENCOUNTER — Encounter: Payer: Self-pay | Admitting: Nurse Practitioner

## 2022-01-13 VITALS — BP 122/60 | HR 81 | Ht 63.0 in | Wt 167.4 lb

## 2022-01-13 DIAGNOSIS — I10 Essential (primary) hypertension: Secondary | ICD-10-CM | POA: Diagnosis not present

## 2022-01-13 DIAGNOSIS — N183 Chronic kidney disease, stage 3 unspecified: Secondary | ICD-10-CM | POA: Insufficient documentation

## 2022-01-13 DIAGNOSIS — I2510 Atherosclerotic heart disease of native coronary artery without angina pectoris: Secondary | ICD-10-CM | POA: Diagnosis not present

## 2022-01-13 DIAGNOSIS — I2119 ST elevation (STEMI) myocardial infarction involving other coronary artery of inferior wall: Secondary | ICD-10-CM

## 2022-01-13 DIAGNOSIS — I255 Ischemic cardiomyopathy: Secondary | ICD-10-CM

## 2022-01-13 DIAGNOSIS — E785 Hyperlipidemia, unspecified: Secondary | ICD-10-CM

## 2022-01-13 LAB — BASIC METABOLIC PANEL
Anion gap: 9 (ref 5–15)
BUN: 21 mg/dL (ref 8–23)
CO2: 28 mmol/L (ref 22–32)
Calcium: 9.1 mg/dL (ref 8.9–10.3)
Chloride: 98 mmol/L (ref 98–111)
Creatinine, Ser: 1.44 mg/dL — ABNORMAL HIGH (ref 0.44–1.00)
GFR, Estimated: 36 mL/min — ABNORMAL LOW (ref >60)
Glucose, Bld: 114 mg/dL — ABNORMAL HIGH (ref 70–99)
Potassium: 3.7 mmol/L (ref 3.5–5.1)
Sodium: 135 mmol/L (ref 135–145)

## 2022-01-13 NOTE — Patient Instructions (Signed)
Medication Instructions:  ?No changes at this time.  ? ?*If you need a refill on your cardiac medications before your next appointment, please call your pharmacy* ? ? ?Lab Work: ?BMET today over at the [facility] and check in at registration.  ? ?If you have labs (blood work) drawn today and your tests are completely normal, you will receive your results only by: ?MyChart Message (if you have MyChart) OR ?A paper copy in the mail ?If you have any lab test that is abnormal or we need to change your treatment, we will call you to review the results. ? ? ?Testing/Procedures: ?None ? ? ?Follow-Up: ?At [facility], you and your health needs are our priority.  As part of our continuing mission to provide you with exceptional heart care, we have created designated Provider Care Teams.  These Care Teams include your primary Cardiologist (physician) and Advanced Practice Providers (APPs -  Physician Assistants and Nurse Practitioners) who all work together to provide you with the care you need, when you need it. ? ? ?Your next appointment:   ?1 month(s) ? ?The format for your next appointment:   ?In Person ? ?Provider:   ?Deri Fuelling, MD ? ? ? ? ? ?Important Information About Sugar ? ? ? ? ?  ?

## 2022-01-13 NOTE — Progress Notes (Signed)
? ? ?Office Visit  ?  ?Patient Name: Kristy Patterson ?Date of Encounter: 01/13/2022 ? ?Primary Care Provider:  Leonel Ramsay, MD ?Primary Cardiologist:  Glenetta Hew, MD ? ?Chief Complaint  ?  ?86 year old female with a history of hypertension, hyperlipidemia, hypothyroidism, stage III chronic kidney disease, GERD, irritable bowel syndrome, diverticulitis and recurrent UTIs, who presents for follow-up after recent inferior STEMI status post multivessel PCI, complicated by acute HFpEF and urinary retention with hydroureteronephrosis requiring Foley placement at discharge. ? ?Past Medical History  ?  ?Past Medical History:  ?Diagnosis Date  ? Acquired hypothyroidism 10/06/2015  ? Anemia   ? CAD (coronary artery disease)   ? a. 12/31/2021 Inf STEMI/PCI: LM 44m, LAD 95ost, 79m, LCX 99p/m (2.75x22 Onyx Frontier DES), RCA 70p; b. 01/01/2022 Staged PCI: Mid LM/Ost LAD (3.5x16 Synergy XD DES), Prox LAD (3.0x16 Synergy XD DES).  ? Chronic heart failure with preserved ejection fraction (HFpEF) ([place])   ? a. 12/2021 Echo: EF 50-55%, omd LVH, GrI DD, triv MR.  ? Diverticulitis   ? Diverticulosis   ? GERD (gastroesophageal reflux disease)   ? Hydroureteronephrosis   ? a. 12/2021 - post MI/PCI abd pain-->urinary retention.  ? Hypertension   ? Hypertensive urgency 02/22/2016  ? Irritable bowel syndrome (IBS)   ? Precordial chest pain 02/23/2016  ? Recurrent UTI 09/18/2015  ? Renal disorder   ? Sleep apnea   ? Stage 3 chronic kidney disease ([place])   ? Thyroid disease   ? ?Past Surgical History:  ?Procedure Laterality Date  ? ABDOMINAL HYSTERECTOMY  1971  ? COLON SURGERY  2010  ? colorectal  ? CORONARY STENT INTERVENTION N/A 01/01/2022  ? Procedure: CORONARY STENT INTERVENTION;  Surgeon: Jettie Booze, MD;  Location: [place] [facility];  Service: Cardiovascular;  Laterality: N/A;  ? CORONARY/GRAFT ACUTE MI REVASCULARIZATION Right 12/30/2021  ? DES x1 to Cx, IABP placed  ? CORONARY/GRAFT ACUTE MI REVASCULARIZATION N/A 12/30/2021  ?  Procedure: Coronary/Graft Acute MI Revascularization;  Surgeon: Andrez Grime, MD;  Location: [place] [facility];  Service: Cardiovascular;  Laterality: N/A;  ? [place]  ? IABP INSERTION N/A 12/30/2021  ? Procedure: IABP Insertion;  Surgeon: Andrez Grime, MD;  Location: [place] [facility];  Service: Cardiovascular;  Laterality: N/A;  ? INTRAVASCULAR ULTRASOUND/IVUS N/A 01/01/2022  ? Procedure: Intravascular Ultrasound/IVUS;  Surgeon: Jettie Booze, MD;  Location: [place] [facility];  Service: Cardiovascular;  Laterality: N/A;  ? LEFT HEART CATH N/A 01/01/2022  ? Procedure: Left Heart Cath;  Surgeon: Jettie Booze, MD;  Location: [place] [facility];  Service: Cardiovascular;  Laterality: N/A;  ? LEFT HEART CATH AND CORONARY ANGIOGRAPHY N/A 12/30/2021  ? Procedure: LEFT HEART CATH AND CORONARY ANGIOGRAPHY;  Surgeon: Andrez Grime, MD;  Location: [place] [facility];  Service: Cardiovascular;  Laterality: N/A;  ? PELVIC FLOOR REPAIR    ? anterior colporrhaphy, posterior colporraphy and colpoperineorrhaphy  ? ? ?Allergies ? ?Allergies  ?Allergen Reactions  ? Fd&C Blue #1 (Brilliant Blue Fcf) Nausea Only  ? Isopto Atropine [Atropine] Swelling  ?  Dried me out "made my throat close up"  ? Miralax [Polyethylene Glycol] Other (See Comments)  ?  Severe stomach upset  ? Aldactone [Spironolactone] Nausea Only and Other (See Comments)  ?  Lip numbness ?Constipation  ? Banophen [Diphenhydramine Hcl] Nausea Only and Palpitations  ? Compazine [Prochlorperazine Edisylate] Anxiety  ?  Gets the shakes   ? Iodinated Contrast Media Palpitations  ?  States she's not to have contrast because of kidneys ?GI distress  ? Norvasc [Amlodipine] Palpitations  ? ? ?History of Present Illness  ?  ?86 year old female with above complex past medical history including CAD, hypertension, hyperlipidemia, hypothyroidism, stage III chronic kidney disease, GERD, irritable bowel syndrome, diverticulitis, and  recurrent urinary tract infections.  She presented to [facility] on May 3 with chest pain and inferior STEMI.  She underwent diagnostic catheterization revealing severe multivessel coronary artery disease involving the left main, LAD, circumflex, and RCA.  The circumflex was felt to be the infarct vessel, and this was treated with a drug-eluting stent.  In the setting of residual severe disease, intra-aortic balloon pump was placed and the patient was transferred to [facility] for further evaluation.  Echocardiogram on May 4 showed an EF of 50 to 55% with mild anterolateral hypokinesis and grade 1 diastolic dysfunction.  RV systolic function was mildly reduced.  She was seen by CT surgery and felt to be a high risk surgical candidate and therefore, she was taken back to the Cath Lab on May 5 underwent successful orbital atherectomy and drug-eluting stent placement to the distal left main/ostial LAD, with a second stent placed in the mid LAD.  Residual RCA disease was felt to be medically managed.  Balloon pump was removed at a later she was placed on medical therapy including aspirin, Plavix, beta-blocker, nitrate, and high potency statin therapy.  Post cath, she reported abdominal discomfort.  She was also seen by GI and CT of the abdomen and pelvis showed diverticulosis without diverticulitis or bowel obstruction.  She was also incidentally noted to have urinary retention with bilateral hydroureteronephrosis.  She was seen by urology and a coud? catheter was placed with plan for outpatient urology follow-up. ? ?Since discharge, she has generally felt well from a cardiac standpoint.  She has not been experiencing chest pain or dyspnea.  She is working with PT and OT and at this point, activity is fairly limited.  She is bothered by having the foley in place and having to carry around the foley bag, and believes she would be more active without it.  She is looking forward to cardiac rehabilitation but does  not feel like she is at a point where she would tolerate it currently.  She has been tolerating medications well.  She denies palpitations, PND, orthopnea, dizziness, syncope, or early satiety.  She has been dealing with mild lower extremity swelling but notes that she sits throughout much of the day. ? ?Home Medications  ?  ?Current Outpatient Medications  ?Medication Sig Dispense Refill  ? acetaminophen (TYLENOL) 500 MG tablet Take 500 mg by mouth every 6 (six) hours as needed for mild pain.    ? aspirin 81 MG EC tablet Take 1 tablet (81 mg total) by mouth daily. Swallow whole. 90 tablet 3  ? atorvastatin (LIPITOR) 80 MG tablet Take 1 tablet (80 mg total) by mouth daily. 30 tablet 2  ? Cholecalciferol (VITAMIN D3) 25 MCG (1000 UT) CAPS Take 1 capsule by mouth daily.    ? clopidogrel (PLAVIX) 75 MG tablet Take 1 tablet (75 mg total) by mouth daily with breakfast. 90 tablet 3  ? cyanocobalamin 1000 MCG tablet Take 1,000 mcg by mouth every 30 (thirty) days.    ? fexofenadine (ALLEGRA) 180 MG tablet Take 180 mg by mouth daily as needed for allergies.    ? furosemide (LASIX) 40 MG tablet Take 1 tablet (40 mg total) by mouth daily.  30 tablet 2  ? isosorbide mononitrate (IMDUR) 30 MG 24 hr tablet Take 1/2 tablet (15 mg total) by mouth daily. 15 tablet 2  ? levothyroxine (SYNTHROID) 125 MCG tablet Take 125 mcg by mouth daily.    ? metoprolol tartrate (LOPRESSOR) 50 MG tablet Take 1 tablet (50 mg total) by mouth 2 (two) times daily. 60 tablet 2  ? nitroGLYCERIN (NITROSTAT) 0.4 MG SL tablet Place 1 tablet (0.4 mg total) under the tongue every 5 (five) minutes x 3 doses as needed for chest pain. 25 tablet 2  ? pantoprazole (PROTONIX) 40 MG tablet Take 1 tablet (40 mg total) by mouth daily. 30 tablet 2  ? Polyvinyl Alcohol-Povidone (REFRESH OP) Place 1 drop into both eyes 2 (two) times daily as needed (dry eyes).    ? ?No current facility-administered medications for this visit.  ?  ? ?Review of Systems  ?  ?Mild right  greater than left ankle edema.  Still weak and requiring physical therapy.  Bothered by having a Foley.  Somewhat chronic constipation.  She denies chest pain, dyspnea, palpitations, PND, orthopnea, dizziness, sy

## 2022-01-13 NOTE — Telephone Encounter (Signed)
Per phase I cardiac rehab, fax cardiac rehab referral to [facility] cardiac rehab. ?

## 2022-01-20 ENCOUNTER — Encounter: Payer: Self-pay | Admitting: Urology

## 2022-01-20 ENCOUNTER — Ambulatory Visit: Payer: Federal, State, Local not specified - PPO | Admitting: Urology

## 2022-01-20 VITALS — BP 138/76 | HR 70 | Ht 63.0 in | Wt 165.0 lb

## 2022-01-20 DIAGNOSIS — R339 Retention of urine, unspecified: Secondary | ICD-10-CM

## 2022-01-20 DIAGNOSIS — N1330 Unspecified hydronephrosis: Secondary | ICD-10-CM

## 2022-01-20 DIAGNOSIS — I2510 Atherosclerotic heart disease of native coronary artery without angina pectoris: Secondary | ICD-10-CM

## 2022-01-20 HISTORY — DX: Atherosclerotic heart disease of native coronary artery without angina pectoris: I25.10

## 2022-01-20 LAB — BLADDER SCAN AMB NON-IMAGING: Scan Result: 900

## 2022-01-20 NOTE — Progress Notes (Unsigned)
Catheter Removal  Patient is present today for a catheter removal.  57ml of water was drained from the balloon. A 16 coude foley cath was removed from the bladder no complications were noted . Patient tolerated well.  Performed by: Elberta Leatherwood CMA  Follow up/ Additional notes: this afternoon

## 2022-01-20 NOTE — Progress Notes (Unsigned)
Simple Catheter Placement  Due to urinary retention patient is present today for a foley cath placement.  Patient was cleaned and prepped in a sterile fashion with betadine. A 16 FR foley catheter was inserted, urine return was noted  927ml, urine was yellow in color.  The balloon was filled with 10cc of sterile water.  A night bag was attached for drainage. Patient was also given a night bag to take home and was given instruction on how to change from one bag to another.  Patient was given instruction on proper catheter care.  Patient tolerated well, no complications were noted   Performed by: Gaspar Cola CMA

## 2022-01-20 NOTE — Progress Notes (Unsigned)
01/20/2022 12:54 PM   Kristy Patterson 12/24/1935 [id number]  Referring provider: Leonel Ramsay, MD [place],  [place] [id number]  Chief Complaint  Patient presents with   Other    HPI: Kristy Patterson is a 86 y.o. female referred for evaluation of urinary retention.  She presents today with her daughter.  Hospitalized at [facility] May 4-12 2023 for acute STEMI and underwent coronary stent intervention CT performed for abdominal pain during that hospitalization showed bladder distention and moderate bilateral hydronephrosis/hydroureter to the bladder Foley catheter was placed 5/10 but removed 5/11 with recurrent retention.  Catheter has been indwelling since that time.  No previous history of urinary retention though several year history of OAB symptoms. A CT performed October 2022 showed no bladder distention or hydronephrosis She underwent pelvic floor surgery while living in [place] in 2013.  She states mesh was not used   PMH: Past Medical History:  Diagnosis Date   Acquired hypothyroidism 10/06/2015   Anemia    CAD (coronary artery disease)    a. 12/31/2021 Inf STEMI/PCI: LM 59m, LAD 95ost, 64m, LCX 99p/m (2.75x22 Onyx Frontier DES), RCA 70p; b. 01/01/2022 Staged PCI: Mid LM/Ost LAD (3.5x16 Synergy XD DES), Prox LAD (3.0x16 Synergy XD DES).   Chronic heart failure with preserved ejection fraction (HFpEF) ([place])    a. 12/2021 Echo: EF 50-55%, omd LVH, GrI DD, triv MR.   Diverticulitis    Diverticulosis    GERD (gastroesophageal reflux disease)    Hydroureteronephrosis    a. 12/2021 - post MI/PCI abd pain-->urinary retention.   Hypertension    Hypertensive urgency 02/22/2016   Irritable bowel syndrome (IBS)    Precordial chest pain 02/23/2016   Recurrent UTI 09/18/2015   Renal disorder    Sleep apnea    Stage 3 chronic kidney disease ([place])    Thyroid disease     Surgical History: Past Surgical History:  Procedure Laterality Date   ABDOMINAL HYSTERECTOMY   1971   COLON SURGERY  2010   colorectal   CORONARY STENT INTERVENTION N/A 01/01/2022   Procedure: CORONARY STENT INTERVENTION;  Surgeon: Jettie Booze, MD;  Location: [place] [facility];  Service: Cardiovascular;  Laterality: N/A;   CORONARY/GRAFT ACUTE MI REVASCULARIZATION Right 12/30/2021   DES x1 to Cx, IABP placed   CORONARY/GRAFT ACUTE MI REVASCULARIZATION N/A 12/30/2021   Procedure: Coronary/Graft Acute MI Revascularization;  Surgeon: Andrez Grime, MD;  Location: [place] [facility];  Service: Cardiovascular;  Laterality: N/A;   HEMORRHOID SURGERY  1967   IABP INSERTION N/A 12/30/2021   Procedure: IABP Insertion;  Surgeon: Andrez Grime, MD;  Location: [place] [facility];  Service: Cardiovascular;  Laterality: N/A;   INTRAVASCULAR ULTRASOUND/IVUS N/A 01/01/2022   Procedure: Intravascular Ultrasound/IVUS;  Surgeon: Jettie Booze, MD;  Location: [place] [facility];  Service: Cardiovascular;  Laterality: N/A;   LEFT HEART CATH N/A 01/01/2022   Procedure: Left Heart Cath;  Surgeon: Jettie Booze, MD;  Location: [place] [facility];  Service: Cardiovascular;  Laterality: N/A;   LEFT HEART CATH AND CORONARY ANGIOGRAPHY N/A 12/30/2021   Procedure: LEFT HEART CATH AND CORONARY ANGIOGRAPHY;  Surgeon: Andrez Grime, MD;  Location: [place] [facility];  Service: Cardiovascular;  Laterality: N/A;   PELVIC FLOOR REPAIR     anterior colporrhaphy, posterior colporraphy and colpoperineorrhaphy    Home Medications:  Allergies as of 01/20/2022       Reactions   Fd&c Blue #1 (brilliant  Blue Fcf) Nausea Only   Isopto Atropine [atropine] Swelling   Dried me out "made my throat close up"   Miralax [polyethylene Glycol] Other (See Comments)   Severe stomach upset   Aldactone [spironolactone] Nausea Only, Other (See Comments)   Lip numbness Constipation   Banophen [diphenhydramine Hcl] Nausea Only, Palpitations   Compazine [prochlorperazine Edisylate] Anxiety   Gets  the shakes    Iodinated Contrast Media Palpitations   States she's not to have contrast because of kidneys GI distress   Norvasc [amlodipine] Palpitations        Medication List        Accurate as of Jan 20, 2022 12:54 PM. If you have any questions, ask your nurse or doctor.          acetaminophen 500 MG tablet Commonly known as: TYLENOL Take 500 mg by mouth every 6 (six) hours as needed for mild pain.   Aspirin Low Dose 81 MG tablet Generic drug: aspirin EC Take 1 tablet (81 mg total) by mouth daily. Swallow whole.   atorvastatin 80 MG tablet Commonly known as: LIPITOR Take 1 tablet (80 mg total) by mouth daily.   clopidogrel 75 MG tablet Commonly known as: PLAVIX Take 1 tablet (75 mg total) by mouth daily with breakfast.   cyanocobalamin 1000 MCG tablet Take 1,000 mcg by mouth every 30 (thirty) days.   fexofenadine 180 MG tablet Commonly known as: ALLEGRA Take 180 mg by mouth daily as needed for allergies.   furosemide 40 MG tablet Commonly known as: LASIX Take 1 tablet (40 mg total) by mouth daily.   isosorbide mononitrate 30 MG 24 hr tablet Commonly known as: IMDUR Take 1/2 tablet (15 mg total) by mouth daily.   levothyroxine 125 MCG tablet Commonly known as: SYNTHROID Take 125 mcg by mouth daily.   metoprolol tartrate 50 MG tablet Commonly known as: LOPRESSOR Take 1 tablet (50 mg total) by mouth 2 (two) times daily.   nitroGLYCERIN 0.4 MG SL tablet Commonly known as: NITROSTAT Place 1 tablet (0.4 mg total) under the tongue every 5 (five) minutes x 3 doses as needed for chest pain.   pantoprazole 40 MG tablet Commonly known as: PROTONIX Take 1 tablet (40 mg total) by mouth daily.   REFRESH OP Place 1 drop into both eyes 2 (two) times daily as needed (dry eyes).   Vitamin D3 25 MCG (1000 UT) Caps Take 1 capsule by mouth daily.        Allergies:  Allergies  Allergen Reactions   Fd&C Blue #1 (Brilliant Blue Fcf) Nausea Only   Isopto  Atropine [Atropine] Swelling    Dried me out "made my throat close up"   Miralax [Polyethylene Glycol] Other (See Comments)    Severe stomach upset   Aldactone [Spironolactone] Nausea Only and Other (See Comments)    Lip numbness Constipation   Banophen [Diphenhydramine Hcl] Nausea Only and Palpitations   Compazine [Prochlorperazine Edisylate] Anxiety    Gets the shakes    Iodinated Contrast Media Palpitations    States she's not to have contrast because of kidneys GI distress   Norvasc [Amlodipine] Palpitations    Family History: Family History  Problem Relation Age of Onset   Cervical cancer Mother    Lung disease Father     Social History:  reports that she has never smoked. She has never used smokeless tobacco. She reports that she does not drink alcohol and does not use drugs.   Physical Exam: BP 138/76  Pulse (!) 0   Ht 5\' 3"  (1.6 m)   Wt 165 lb (74.8 kg)   BMI 29.23 kg/m   Constitutional:  Alert, No acute distress. HEENT: [place] AT Respiratory: Normal respiratory effort, no increased work of breathing. Psychiatric: Normal mood and affect.   Pertinent Imaging: CT images were personally reviewed and interpreted   Assessment & Plan:    1.  Urinary retention Etiology undetermined Catheter removed for voiding trial Follow-up bladder scan this afternoon  2.  Hydronephrosis/hydroureter Secondary to above.  CT October 2022 showed no hydronephrosis or bladder distention  Her daughter lives in [place] and she desires to transition care to [facility] once she gets her mother moved from [place], [place] [address], [place], [place] [address] [phone]

## 2022-01-21 ENCOUNTER — Encounter: Payer: Self-pay | Admitting: Urology

## 2022-01-22 ENCOUNTER — Telehealth: Payer: Self-pay | Admitting: Cardiology

## 2022-01-22 NOTE — Telephone Encounter (Signed)
Pt c/o medication issue:  1. Name of Medication: pantoprazole (PROTONIX) 40 MG tablet  2. How are you currently taking this medication (dosage and times per day)? No longer taking  3. Are you having a reaction (difficulty breathing--STAT)? Yes   4. What is your medication issue? Patient states this medication did not work for her and caused nausea. States she stopped taking it a few days ago.

## 2022-01-24 NOTE — Telephone Encounter (Signed)
Pantoprazole was being used for GERD treatment.  Since she was on Plavix she would have been switched to pantoprazole from omeprazole/esomeprazole.  She is not having GERD, then is fine if she does not take it.  Bryan Lemma, MD

## 2022-01-26 NOTE — Telephone Encounter (Signed)
Reviewed recommendations from provider with patient and she verbalized understanding with no further questions at this time.

## 2022-01-26 NOTE — Telephone Encounter (Signed)
-  Pt called questioning how often should she have her kidney function checked while taking lasix.  -Will forward to NP for recommendations.

## 2022-01-26 NOTE — Telephone Encounter (Signed)
Kidney function was stable on lasix at recent check.  We will plan to repeat at follow-up in a month and can determine future need for checks at that point.

## 2022-01-28 ENCOUNTER — Other Ambulatory Visit (HOSPITAL_COMMUNITY): Payer: Self-pay

## 2022-02-13 ENCOUNTER — Encounter: Payer: Self-pay | Admitting: Cardiology

## 2022-02-16 ENCOUNTER — Ambulatory Visit: Payer: Federal, State, Local not specified - PPO | Admitting: Physician Assistant

## 2022-02-18 ENCOUNTER — Ambulatory Visit: Payer: Federal, State, Local not specified - PPO | Admitting: Physician Assistant

## 2022-02-18 ENCOUNTER — Encounter: Payer: Self-pay | Admitting: Cardiology

## 2022-02-18 ENCOUNTER — Telehealth: Payer: Self-pay | Admitting: Cardiology

## 2022-02-18 ENCOUNTER — Ambulatory Visit: Payer: Federal, State, Local not specified - PPO | Admitting: Cardiology

## 2022-02-18 VITALS — BP 120/72 | HR 77 | Ht 63.0 in | Wt 160.2 lb

## 2022-02-18 DIAGNOSIS — I10 Essential (primary) hypertension: Secondary | ICD-10-CM | POA: Diagnosis not present

## 2022-02-18 DIAGNOSIS — I2510 Atherosclerotic heart disease of native coronary artery without angina pectoris: Secondary | ICD-10-CM

## 2022-02-18 DIAGNOSIS — I5032 Chronic diastolic (congestive) heart failure: Secondary | ICD-10-CM

## 2022-02-18 DIAGNOSIS — I255 Ischemic cardiomyopathy: Secondary | ICD-10-CM | POA: Diagnosis not present

## 2022-02-18 DIAGNOSIS — E785 Hyperlipidemia, unspecified: Secondary | ICD-10-CM

## 2022-02-18 DIAGNOSIS — N1832 Chronic kidney disease, stage 3b: Secondary | ICD-10-CM

## 2022-02-18 DIAGNOSIS — R339 Retention of urine, unspecified: Secondary | ICD-10-CM | POA: Diagnosis not present

## 2022-02-18 DIAGNOSIS — Z955 Presence of coronary angioplasty implant and graft: Secondary | ICD-10-CM

## 2022-02-18 DIAGNOSIS — I2119 ST elevation (STEMI) myocardial infarction involving other coronary artery of inferior wall: Secondary | ICD-10-CM | POA: Diagnosis not present

## 2022-02-18 MED ORDER — FUROSEMIDE 40 MG PO TABS: 20.0000 mg | ORAL_TABLET | Freq: Every day | ORAL | 2 refills | Status: DC

## 2022-02-18 MED ORDER — ATORVASTATIN CALCIUM 40 MG PO TABS: 40.0000 mg | ORAL_TABLET | Freq: Every day | ORAL | 3 refills | Status: DC

## 2022-02-18 NOTE — Progress Notes (Unsigned)
Primary Care Provider: Leonel Ramsay, MD [facility] Cardiologist: Glenetta Hew, MD Electrophysiologist: None  Clinic Note: Chief Complaint  Patient presents with   1 month follow up     Patient c/o shortness of breath, constipation and dry mouth. Medications reviewed by the patient verbally.    ===================================  ASSESSMENT/PLAN   Problem List Items Addressed This Visit   None   ===================================  HPI:    Kristy Patterson is a 86 y.o. female with recent Inferior STEMI (complicated by shock-multivessel CAD, emergent PCI of LCx followed by staged LM-LAD PCI on balloon pump, plan for medical management of RCA), HFpEF, chronic urinary retention with hydroureteronephrosis, rectovaginal fistula, IBS who is being seen today for 1 month follow-up at the request of Leonel Ramsay, MD.  Kristy Patterson was last seen on Jan 13, 2022 by Murray Hodgkins, NP.  Doing well for cardiac standpoint.  No chest pain or dyspnea.  Was working with PT and OT but still limited activity.  Still has a Foley catheter in place due to urinary retention.  Was looking for to cardiac rehab but was not yet ready for it.  Tolerating meds.  No palpitations, PND, orthopnea, or dizziness.  Chronic mild lower extremity edema-end of day. Continued aspirin, statin, Plavix beta-blocker and nitrate.  Plan was for lipid panel follow-up. Low threshold to consider restarting ARB pending blood pressure. Continued 40 mg Lasix, leg elevation and support stockings.  Recent Hospitalizations:  Presented to ER at [facility] 12/30/2021-emergent cath with PCI LCx, transferred to [facility] a.m. on 12/31/2021 with IABP in place. = Staged PCI of LM-LAD with plan for medical management of RCA.  Discharge delayed due to urinary retention.  Reviewed  CV studies:    The following studies were reviewed today: (if available, images/films reviewed: From Epic Chart or Care Everywhere) Cardiac cath-PCI  12/31/2021: 2D echo 12/31/2021 Staged PCI 01/01/2022   Interval History:   Kristy Patterson   CV Review of Symptoms (Summary) Cardiovascular ROS: {roscv:[id number]}  REVIEWED OF SYSTEMS   ROS  Mild right greater than left ankle edema.  Still weak and requiring physical therapy.  Bothered by having a Foley.  Somewhat chronic constipation.  She denies chest pain, dyspnea, palpitations, PND, orthopnea, dizziness, syncope, or early satiety.  All other systems reviewed and are otherwise negative except as noted above.  I have reviewed and (if needed) personally updated the patient's problem list, medications, allergies, past medical and surgical history, social and family history.   PAST MEDICAL HISTORY   Past Medical History:  Diagnosis Date   Acquired hypothyroidism 10/06/2015   Anemia    per report -Pernicious anemia   CAD, multiple vessel 01/20/2022   a. 12/31/2021 Inf STEMI/PCI: LM 9m, LAD 95ost, 86m, LCX 99p/m (2.75x22 Onyx Frontier DES), RCA 70p; b. 01/01/2022 Staged PCI: Mid LM/Ost LAD (3.5x16 Synergy XD DES), Prox LAD (3.0x16 Synergy XD DES).   Chronic heart failure with preserved ejection fraction (HFpEF) ([place])    a. 12/2021 Echo: EF 50-55%, omd LVH, GrI DD, triv MR.   Diverticulitis    Diverticulosis    GERD (gastroesophageal reflux disease)    Hydroureteronephrosis    a. 12/2021 - post MI/PCI abd pain-->urinary retention.   Hypertension    Hypertensive urgency 02/22/2016   Irritable bowel syndrome (IBS)    Recurrent UTI 09/18/2015   Sleep apnea    Stage 3 chronic kidney disease ([place])    Thyroid disease     PAST SURGICAL HISTORY   Past  Surgical History:  Procedure Laterality Date   ABDOMINAL HYSTERECTOMY  1971   COLON SURGERY  2010   colorectal   CORONARY STENT INTERVENTION N/A 01/01/2022   Procedure: CORONARY STENT INTERVENTION;  Surgeon: Corky Crafts, MD;  Location: [facility] INVASIVE CV LAB;  Service: Cardiovascular;  Laterality: N/A;   CORONARY/GRAFT ACUTE MI  REVASCULARIZATION Right 12/30/2021   DES x1 to Cx, IABP placed   CORONARY/GRAFT ACUTE MI REVASCULARIZATION N/A 12/30/2021   Procedure: Coronary/Graft Acute MI Revascularization;  Surgeon: Armando Reichert, MD;  Location: [facility] INVASIVE CV LAB;  Service: Cardiovascular;  Laterality: N/A;   HEMORRHOID SURGERY  1967   IABP INSERTION N/A 12/30/2021   Procedure: IABP Insertion;  Surgeon: Armando Reichert, MD;  Location: [facility] INVASIVE CV LAB;  Service: Cardiovascular;  Laterality: N/A;   INTRAVASCULAR ULTRASOUND/IVUS N/A 01/01/2022   Procedure: Intravascular Ultrasound/IVUS;  Surgeon: Corky Crafts, MD;  Location: [facility] INVASIVE CV LAB;  Service: Cardiovascular;  Laterality: N/A;   LEFT HEART CATH N/A 01/01/2022   Procedure: Left Heart Cath;  Surgeon: Corky Crafts, MD;  Location: [facility] INVASIVE CV LAB;  Service: Cardiovascular;  Laterality: N/A;   LEFT HEART CATH AND CORONARY ANGIOGRAPHY N/A 12/30/2021   Procedure: LEFT HEART CATH AND CORONARY ANGIOGRAPHY;  Surgeon: Armando Reichert, MD;  Location: [facility] INVASIVE CV LAB;  Service: Cardiovascular;  Laterality: N/A;   PELVIC FLOOR REPAIR     anterior colporrhaphy, posterior colporraphy and colpoperineorrhaphy    Immunization History  Administered Date(s) Administered   Fluad Quad(high Dose 65+) 07/16/2020   Influenza Split 05/10/2008, 07/31/2009   Influenza, High Dose Seasonal PF 09/18/2015   Influenza, Seasonal, Injecte, Preservative Fre 07/09/2010, 07/02/2011, 07/06/2012   Influenza,inj,Quad PF,6+ Mos 06/27/2019   Influenza-Unspecified 06/23/2007, 07/19/2013, 06/28/2014   PFIZER(Purple Top)SARS-COV-2 Vaccination 04/11/2020, 05/09/2020   Pneumococcal Conjugate-13 06/27/2019   Pneumococcal Polysaccharide-23 10/02/2007, 09/17/2008    MEDICATIONS/ALLERGIES   Current Meds  Medication Sig   acetaminophen (TYLENOL) 500 MG tablet Take 500 mg by mouth every 6 (six) hours as needed for mild pain.   aspirin 81 MG EC tablet Take 1 tablet (81  mg total) by mouth daily. Swallow whole.   atorvastatin (LIPITOR) 80 MG tablet Take 1 tablet (80 mg total) by mouth daily.   Cholecalciferol (VITAMIN D3) 25 MCG (1000 UT) CAPS Take 1 capsule by mouth daily.   clonazePAM (KLONOPIN) 0.5 MG tablet Take 0.5 mg by mouth at bedtime as needed.   clopidogrel (PLAVIX) 75 MG tablet Take 1 tablet (75 mg total) by mouth daily with breakfast.   cyanocobalamin 1000 MCG tablet Take 1,000 mcg by mouth every 30 (thirty) days.   furosemide (LASIX) 40 MG tablet Take 1 tablet (40 mg total) by mouth daily.   isosorbide mononitrate (IMDUR) 30 MG 24 hr tablet Take 1/2 tablet (15 mg total) by mouth daily.   levothyroxine (SYNTHROID) 125 MCG tablet Take 125 mcg by mouth daily.   metoprolol tartrate (LOPRESSOR) 50 MG tablet Take 1 tablet (50 mg total) by mouth 2 (two) times daily.   nitroGLYCERIN (NITROSTAT) 0.4 MG SL tablet Place 1 tablet (0.4 mg total) under the tongue every 5 (five) minutes x 3 doses as needed for chest pain.   pantoprazole (PROTONIX) 40 MG tablet Take 1 tablet (40 mg total) by mouth daily.   Polyvinyl Alcohol-Povidone (REFRESH OP) Place 1 drop into both eyes 2 (two) times daily as needed (dry eyes).    Allergies  Allergen Reactions   Atropine Swelling and Anaphylaxis  Dried me out "made my throat close up" Dried me out "made my throat close up"   Fd&C Blue #1 (Brilliant Blue Fcf) Nausea Only   Miralax [Polyethylene Glycol] Other (See Comments)    Severe stomach upset   Banophen [Diphenhydramine Hcl] Nausea Only and Palpitations   Compazine [Prochlorperazine Edisylate] Anxiety    Gets the shakes    Iodinated Contrast Media Palpitations and Nausea And Vomiting    States she's not to have contrast because of kidneys GI distress Other reaction(s): gi distress States she's not to have contrast because of kidneys GI distress   Norvasc [Amlodipine] Palpitations   Spironolactone Nausea Only and Other (See Comments)    Lip  numbness Constipation Also caused lip numbness and constipation Lip numbness Constipation    SOCIAL HISTORY/FAMILY HISTORY   Reviewed in Epic:   Social History   Tobacco Use   Smoking status: Never   Smokeless tobacco: Never  Vaping Use   Vaping Use: Never used  Substance Use Topics   Alcohol use: Never   Drug use: Never   Social History   Social History Narrative   Lives with adult son Hinton Dyer, who doesn't drive.   Family History  Problem Relation Age of Onset   Cervical cancer Mother    Lung disease Father     OBJCTIVE -PE, EKG, labs   Wt Readings from Last 3 Encounters:  02/18/22 160 lb 4 oz (72.7 kg)  01/20/22 165 lb (74.8 kg)  01/13/22 167 lb 6 oz (75.9 kg)    Physical Exam: BP 120/72 (BP Location: Left Arm, Patient Position: Sitting, Cuff Size: Normal)   Pulse 77   Ht 5\' 3"  (1.6 m)   Wt 160 lb 4 oz (72.7 kg)   SpO2 96%   BMI 28.39 kg/m  Physical Exam  HEENT: normal. Neck: Supple, no JVD, carotid bruits, or masses. Cardiac: RRR, no murmurs, rubs, or gallops. No clubbing, cyanosis, trace right greater than left ankle edema.  Radials/PT 2+ and equal bilaterally.  Right radial catheterization site is ecchymotic without bleeding, bruit, or hematoma. Respiratory:  Respirations regular and unlabored, clear to auscultation bilaterally. GI: Soft, nontender, nondistended, BS + x 4. MS: no deformity or atrophy. Skin: warm and dry, no rash. Neuro:  Strength and sensation are intact. Psych: Normal affect.   Adult ECG Report  Rate: *** ;  Rhythm: {rhythm:17366};   Narrative Interpretation: ***  Recent Labs:  ***  Lab Results  Component Value Date   CHOL 241 (H) 12/31/2021   HDL 58 12/31/2021   LDLCALC 162 (H) 12/31/2021   TRIG 104 12/31/2021   CHOLHDL 4.2 12/31/2021   Lab Results  Component Value Date   CREATININE 1.44 (H) 01/13/2022   Patterson 21 01/13/2022   NA 135 01/13/2022   K 3.7 01/13/2022   CL 98 01/13/2022   CO2 28 01/13/2022      Latest  Ref Rng & Units 01/04/2022   12:31 AM 01/03/2022    1:07 AM 01/02/2022   12:57 AM  CBC  WBC 4.0 - 10.5 K/uL 10.0  14.0  14.5   Hemoglobin 12.0 - 15.0 g/dL 12.6  12.1  12.0   Hematocrit 36.0 - 46.0 % 39.4  37.2  36.0   Platelets 150 - 400 K/uL 215  142  147     Lab Results  Component Value Date   HGBA1C 5.6 12/31/2021   Lab Results  Component Value Date   TSH 4.11 08/21/2019    ==================================================  COVID-19  Education: The signs and symptoms of COVID-19 were discussed with the patient and how to seek care for testing (follow up with PCP or arrange E-visit).    I spent a total of *** minutes with the patient spent in direct patient consultation.  Additional time spent with chart review  / charting (studies, outside notes, etc): *** min Total Time: *** min  Current medicines are reviewed at length with the patient today.  (+/- concerns) ***  This visit occurred during the SARS-CoV-2 public health emergency.  Safety protocols were in place, including screening questions prior to the visit, additional usage of staff PPE, and extensive cleaning of exam room while observing appropriate contact time as indicated for disinfecting solutions.  Notice: This dictation was prepared with Dragon dictation along with smart phrase technology. Any transcriptional errors that result from this process are unintentional and may not be corrected upon review.   Studies Ordered:  No orders of the defined types were placed in this encounter.  No orders of the defined types were placed in this encounter.   Patient Instructions / Medication Changes & Studies & Tests Ordered   There are no Patient Instructions on file for this visit.    Bryan Lemma, M.D., M.S. Interventional Cardiologist   Pager # [phone] Phone # [phone] [address] [place], [place] [address]   Thank you for choosing [facility] in [place]!!

## 2022-02-18 NOTE — Progress Notes (Signed)
Cath Change/ Replacement  Patient is present today for a catheter change due to urinary retention.  65ml of water was removed from the balloon, a 16FR foley cath was removed without difficulty.  Patient was cleaned and prepped in a sterile fashion with betadine. A 16 FR foley cath was replaced into the bladder no complications were noted Urine return was noted 52ml and urine was clear in color. The balloon was filled with 24ml of sterile water. A night bag was attached for drainage.  Patient tolerated well.    Performed by: Carman Ching, PA-C   Additional notes: She reports she is increasingly mobile since her most recent hospitalization.  May consider repeat voiding trial.  We discussed urodynamics, however I explained that the outcome of this will likely not change management if she fails a subsequent voiding trial.  Patient planning to transfer care to [facility].  Timing unclear regarding her first appointment with them.  We discussed returning to clinic in 1 month for monthly catheter change if she has not been seen by [facility] by then. She expressed understanding.  Follow up: Return if symptoms worsen or fail to improve.

## 2022-02-18 NOTE — Telephone Encounter (Signed)
3-4 m fu per checkout 02-18-22 [facility]  Attempted to schedule.  Lmov

## 2022-02-18 NOTE — Patient Instructions (Addendum)
Medication Instructions:  - Your physician has recommended you make the following change in your medication:   1) DECREASE lasix (furosemide) 40 mg: - take 0.5 tablet (20 mg) once daily  (- or if it is easier, take 1 tablet (40 mg) once EVERY OTHER day)  2) DECREASE Lipitor (atorvastatin) to 40 mg: - take 1 tablet by mouth once daily   *If you need a refill on your cardiac medications before your next appointment, please call your pharmacy*   Lab Work: - none ordered (most recent lab work looked good)  If you have labs (blood work) drawn today and your tests are completely normal, you will receive your results only by: MyChart Message (if you have MyChart) OR A paper copy in the mail If you have any lab test that is abnormal or we need to change your treatment, we will call you to review the results.   Testing/Procedures: - none ordered   Follow-Up: At [facility], you and your health needs are our priority.  As part of our continuing mission to provide you with exceptional heart care, we have created designated Provider Care Teams.  These Care Teams include your primary Cardiologist (physician) and Advanced Practice Providers (APPs -  Physician Assistants and Nurse Practitioners) who all work together to provide you with the care you need, when you need it.  We recommend signing up for the patient portal called "MyChart".  Sign up information is provided on this After Visit Summary.  MyChart is used to connect with patients for Virtual Visits (Telemedicine).  Patients are able to view lab/test results, encounter notes, upcoming appointments, etc.  Non-urgent messages can be sent to your provider as well.   To learn more about what you can do with MyChart, go to [email or web address].    Your next appointment:   2-3 month(s)  The format for your next appointment:   In Person  Provider:   You may see Bryan Lemma, MD or one of the following Advanced Practice Providers on  your designated Care Team:   Nicolasa Ducking, NP    Other Instructions N/a  Important Information About Sugar

## 2022-02-21 ENCOUNTER — Encounter: Payer: Self-pay | Admitting: Cardiology

## 2022-02-21 DIAGNOSIS — I255 Ischemic cardiomyopathy: Secondary | ICD-10-CM | POA: Insufficient documentation

## 2022-02-21 NOTE — Assessment & Plan Note (Signed)
Creatinine 1.36 by recent check. We will hold off on ACE inhibitor/ARB for now.

## 2022-03-29 ENCOUNTER — Other Ambulatory Visit: Payer: Self-pay | Admitting: Student

## 2022-04-01 NOTE — Telephone Encounter (Signed)
Attempted to schedule.  No ans no vm  

## 2022-04-02 ENCOUNTER — Telehealth: Payer: Self-pay | Admitting: Cardiology

## 2022-04-02 NOTE — Telephone Encounter (Signed)
Noted  

## 2022-04-02 NOTE — Telephone Encounter (Signed)
Patient called stating she has moved to a new location and will need to check with her daughter before she schedules a future appointment.

## 2022-04-08 NOTE — Telephone Encounter (Signed)
Rolena Infante A  Sent: Thu April 08, 2022 10:45 AM  To: Jefferey Pica, RN          Message  Patient has moved and is now seeing Dr. Jon Billings, [facility].

## 2022-04-08 NOTE — Telephone Encounter (Signed)
Noted  

## 2022-05-18 ENCOUNTER — Other Ambulatory Visit: Payer: Self-pay | Admitting: Student

## 2022-05-18 ENCOUNTER — Other Ambulatory Visit: Payer: Self-pay | Admitting: Cardiology

## 2022-05-18 NOTE — Telephone Encounter (Signed)
Per 04/02/22 telephone encounter:         Patient declined to schedule F/U appt with Dr. Ellyn Hack. Patient has moved and is now seeing Dr. Kennith Center, [facility].   Thank you!

## 2022-07-05 ENCOUNTER — Other Ambulatory Visit: Payer: Self-pay | Admitting: Student

## 2022-07-05 NOTE — Telephone Encounter (Signed)
Please schedule overdue F/U appointment. Thank you! ?

## 2022-08-27 ENCOUNTER — Other Ambulatory Visit: Payer: Self-pay | Admitting: Student

## 2022-10-26 ENCOUNTER — Other Ambulatory Visit: Payer: Self-pay | Admitting: Student

## 2022-11-02 ENCOUNTER — Other Ambulatory Visit: Payer: Self-pay | Admitting: Cardiology

## 2022-11-29 ENCOUNTER — Other Ambulatory Visit: Payer: Self-pay | Admitting: Student

## 2022-11-29 ENCOUNTER — Other Ambulatory Visit: Payer: Self-pay | Admitting: Cardiology

## 2023-02-09 ENCOUNTER — Other Ambulatory Visit: Payer: Self-pay | Admitting: Cardiology

## 2023-04-10 ENCOUNTER — Other Ambulatory Visit: Payer: Self-pay | Admitting: Cardiology

## 2023-05-10 ENCOUNTER — Other Ambulatory Visit: Payer: Self-pay | Admitting: Cardiology

## 2023-06-02 ENCOUNTER — Other Ambulatory Visit: Payer: Self-pay | Admitting: Cardiology

## 2023-06-05 ENCOUNTER — Other Ambulatory Visit: Payer: Self-pay | Admitting: Cardiology

## 2023-07-01 ENCOUNTER — Other Ambulatory Visit: Payer: Self-pay | Admitting: Cardiology

## 2023-07-11 ENCOUNTER — Other Ambulatory Visit: Payer: Self-pay | Admitting: Cardiology

## 2023-09-10 IMAGING — CT CT ABD-PELV W/ CM
2 of 5 series · 16 of 46 positions shown, 18 images · IV contrast (APPLIED)
Comparison: 01/25/2020 and previous

CLINICAL DATA: Cough, chest tightness, left upper quadrant pain x1
week, hypertension

EXAM:
CT ABDOMEN AND PELVIS WITH CONTRAST
TECHNIQUE: Multidetector CT imaging of the abdomen and pelvis was performed
using the standard protocol following bolus administration of
intravenous contrast.
CONTRAST:  60mL OMNIPAQUE IOHEXOL 350 MG/ML SOLN

[Series 2: axial st · axial · 0.77mm/px · z∈[-655,-260]mm · 13 of 89 slices shown, 15 images]
[im 5/89  soft-tissue]
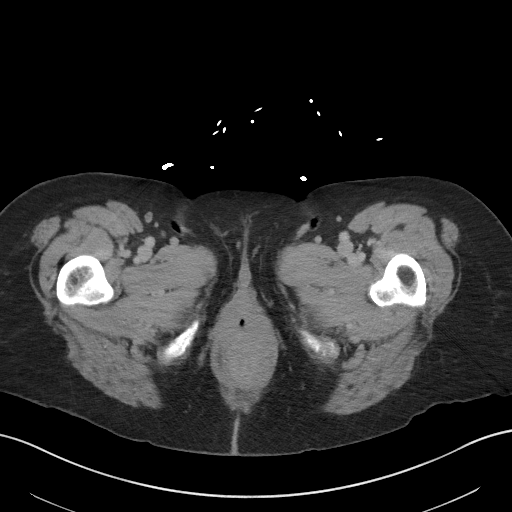
[im 5/89  bone]
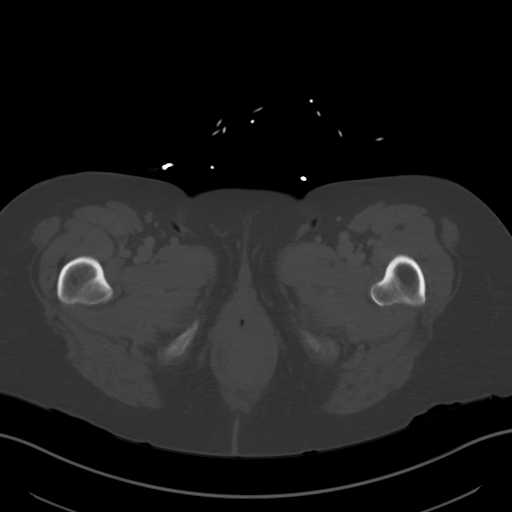
[im 14/89  soft-tissue]
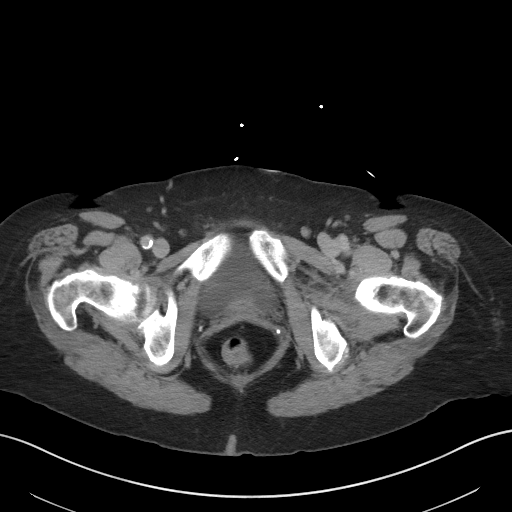
[im 18/89  soft-tissue]
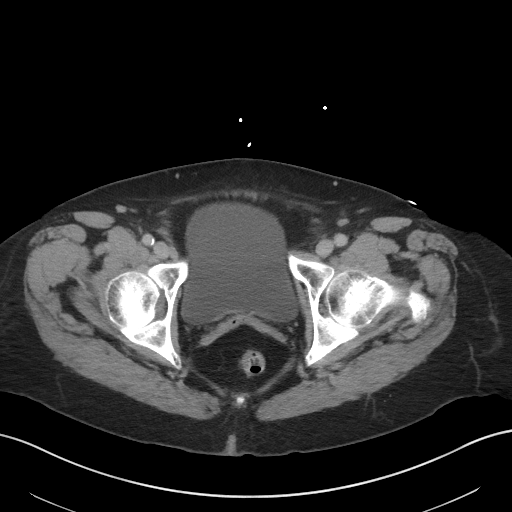
[im 27/89  soft-tissue]
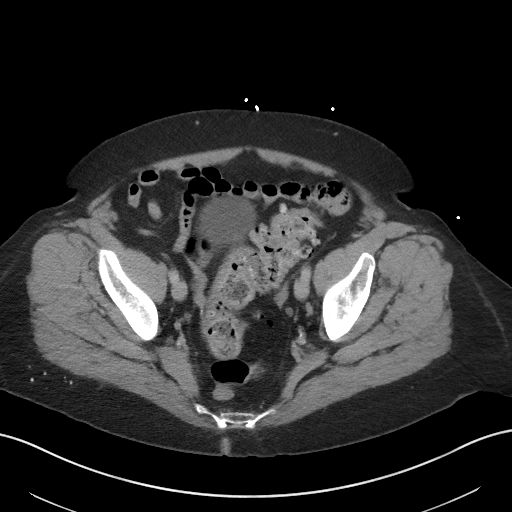
[im 31/89  soft-tissue]
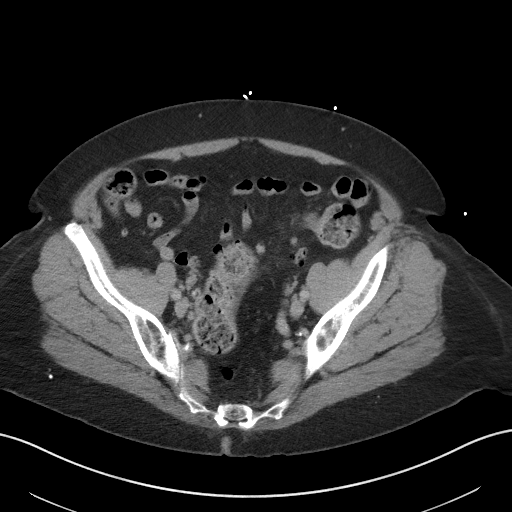
[im 40/89  soft-tissue]
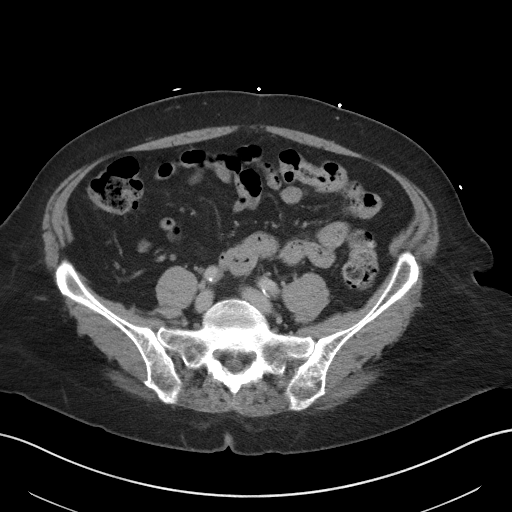
[im 45/89  soft-tissue]
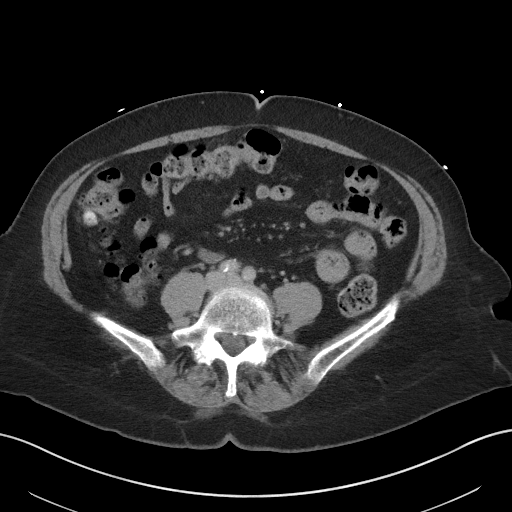
[im 49/89  soft-tissue]
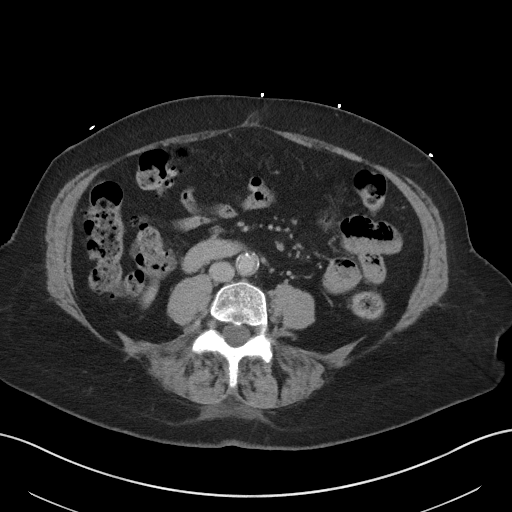
[im 58/89  soft-tissue]
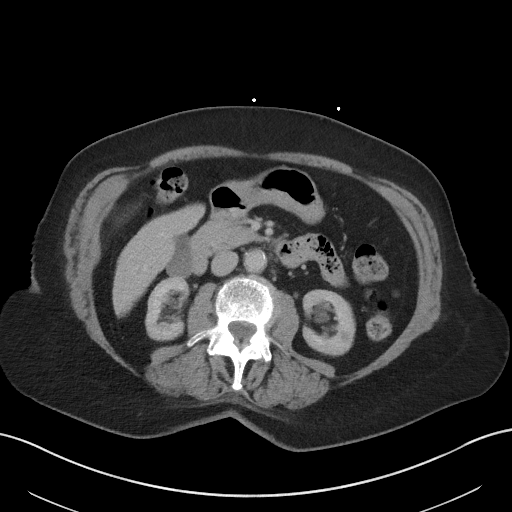
[im 58/89  bone]
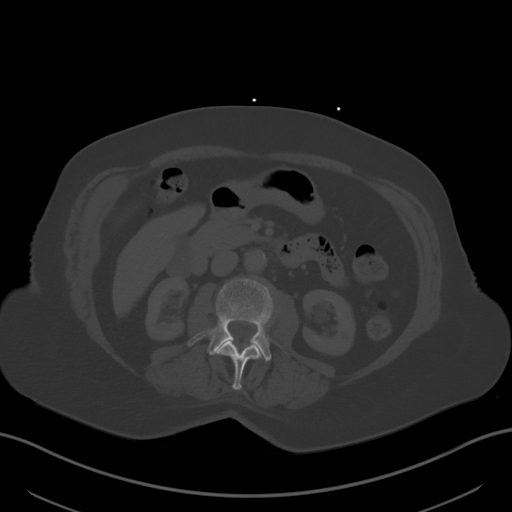
[im 62/89  soft-tissue]
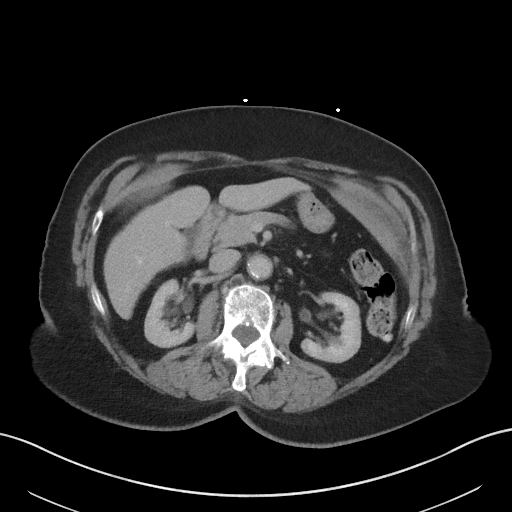
[im 71/89  soft-tissue]
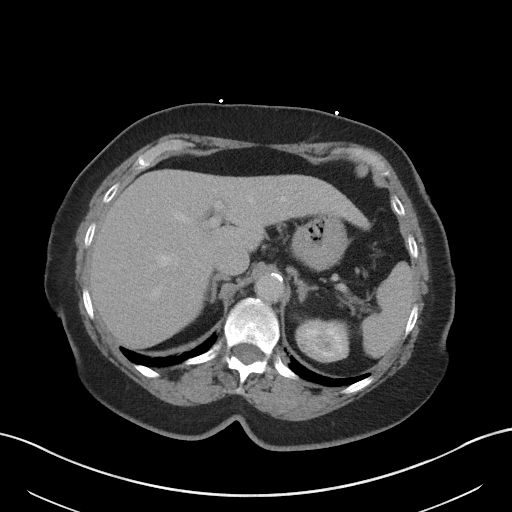
[im 75/89  soft-tissue]
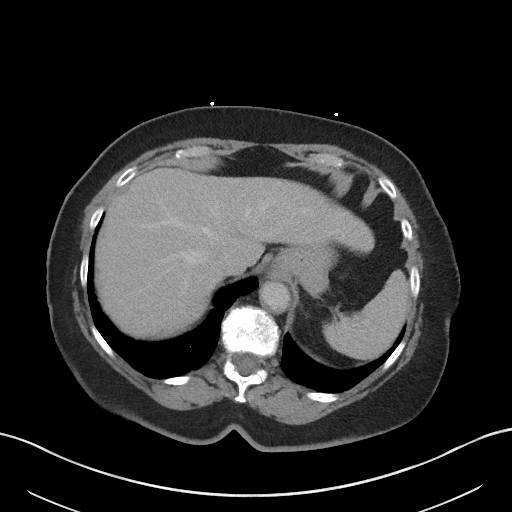
[im 84/89  soft-tissue]
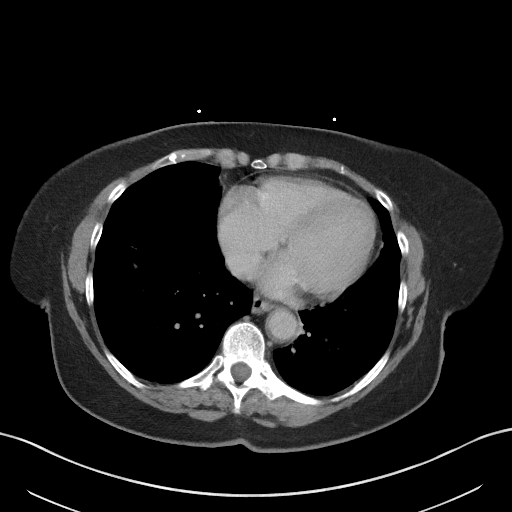

[Series 5: coronal st · coronal · 0.74mm/px · 3 of 104 slices shown]
[im 35/104  soft-tissue]
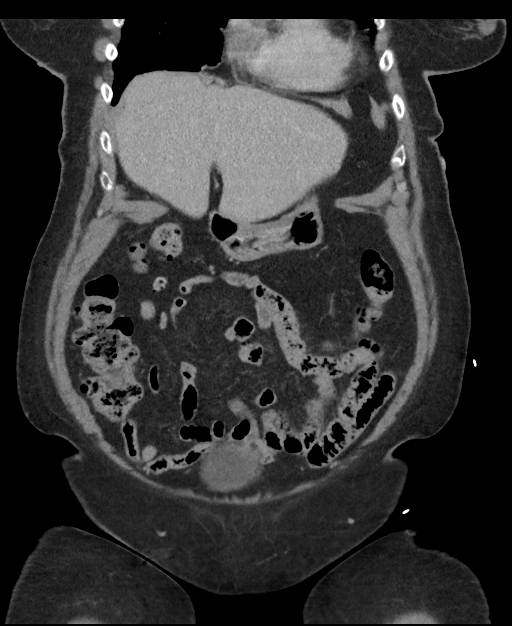
[im 46/104  soft-tissue]
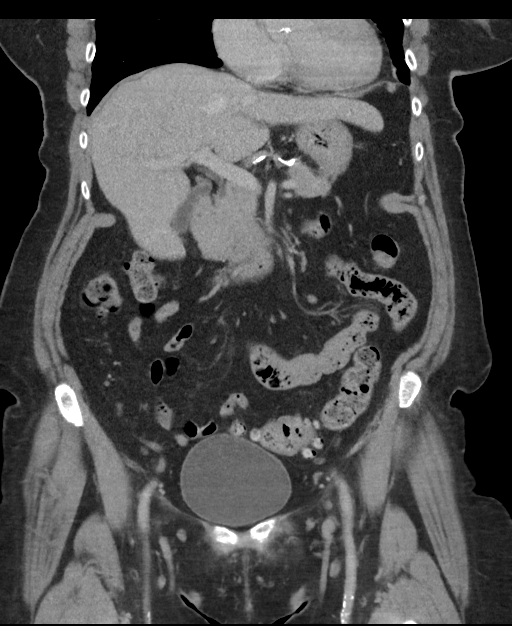
[im 58/104  soft-tissue]
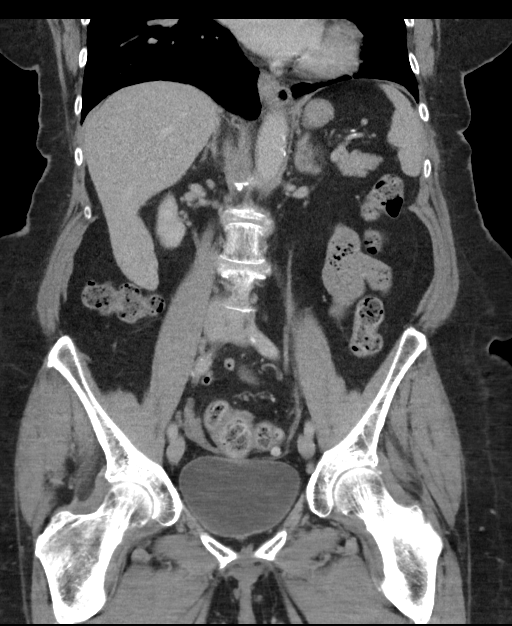

[16 of 46 positions shown; findings below may reference images not displayed]

FINDINGS: Lower chest: Aortic valve and coronary calcifications. No pleural or
pericardial effusion.

Hepatobiliary: No focal liver abnormality is seen. No gallstones,
gallbladder wall thickening, or biliary dilatation.

Pancreas: Unremarkable. No pancreatic ductal dilatation or
surrounding inflammatory changes.

Spleen: Normal in size without focal abnormality.

Adrenals/Urinary Tract: Adrenal glands unremarkable. 2.6 cm
exophytic cyst from the upper pole left kidney, stable. 1.2 cm
probable cyst from the lower pole left kidney. No hydronephrosis or
urolithiasis evident. Urinary bladder unremarkable.

Stomach/Bowel: Small hiatal hernia. Stomach is nondistended. Small
bowel decompressed. Normal appendix. Colon nondilated. Scattered
colonic diverticula most numerous in the proximal sigmoid segment
without significant adjacent inflammatory change.

Vascular/Lymphatic: Partially calcified moderate aortoiliac
atheromatous plaque without aneurysm. No abdominal or pelvic
adenopathy.

Reproductive: Status post hysterectomy. No adnexal masses.

Other: Bilateral pelvic phleboliths.  No ascites.  No free air.

Musculoskeletal: Multilevel lower thoracic and lumbar spondylitic
change. No fracture or worrisome bone lesion.
IMPRESSION: 1. No acute findings.
2. Sigmoid diverticulosis
3. Stable left renal cysts.
4. Coronary and aortoiliac atherosclerosis (SIGN0-170.0).
5. Small hiatal hernia.

## 2023-09-10 IMAGING — CT CT ANGIO CHEST
2 of 6 series · 18 of 46 positions shown · IV contrast (APPLIED)
Comparison: CT abdomen 01/25/2020 and previous

CLINICAL DATA: Hypertension, cough, chest tightness, left upper
abdominal pain, high prob PE suspected

EXAM:
CT ANGIOGRAPHY CHEST WITH CONTRAST
TECHNIQUE: Multidetector CT imaging of the chest was performed using the
standard protocol during bolus administration of intravenous
contrast. Multiplanar CT image reconstructions and MIPs were
obtained to evaluate the vascular anatomy.
CONTRAST:  60mL OMNIPAQUE IOHEXOL 350 MG/ML SOLN

[Series 5: thins · axial · 0.70mm/px · z∈[-335,-85]mm · 15 of 343 slices shown]
[im 15/343  lung]
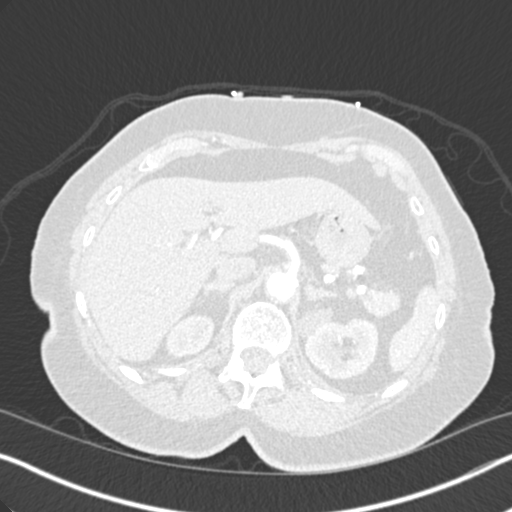
[im 43/343  soft-tissue]
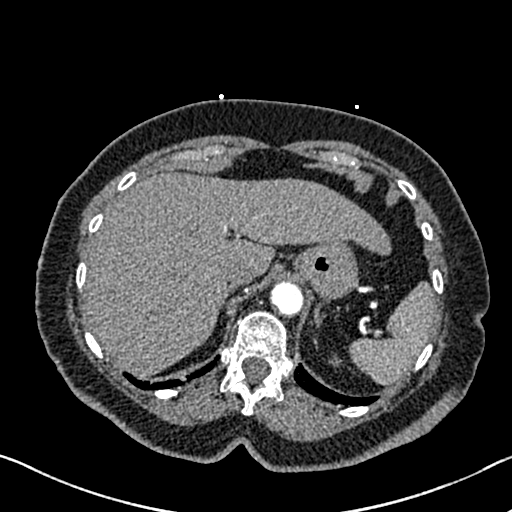
[im 58/343  lung]
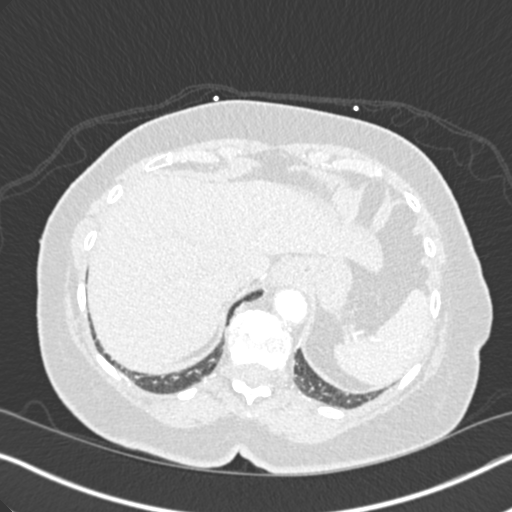
[im 86/343  soft-tissue]
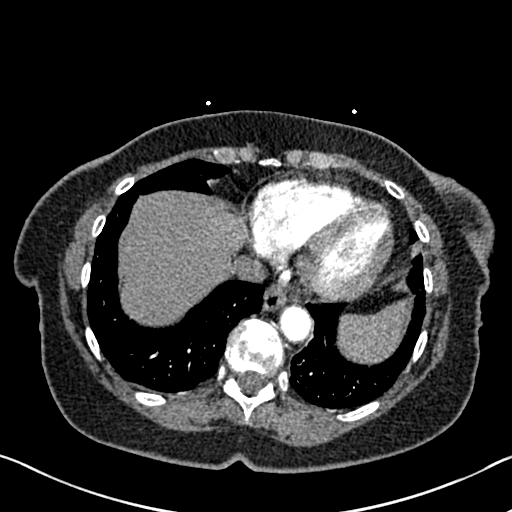
[im 100/343  lung]
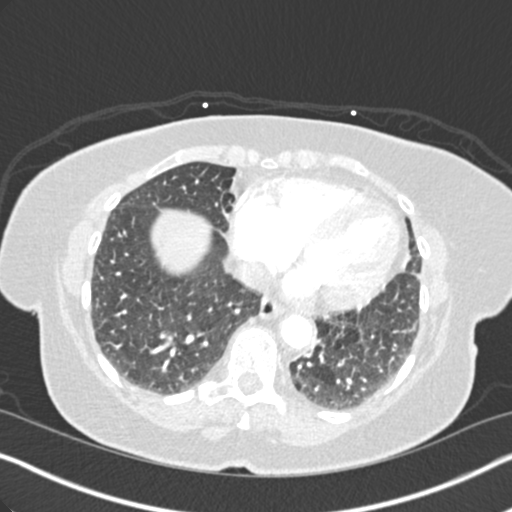
[im 129/343  soft-tissue]
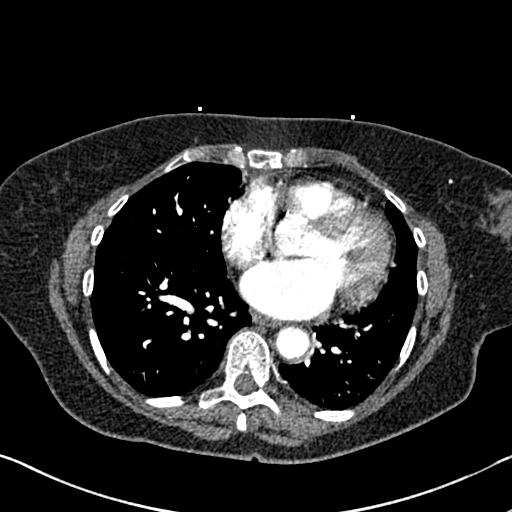
[im 143/343  lung]
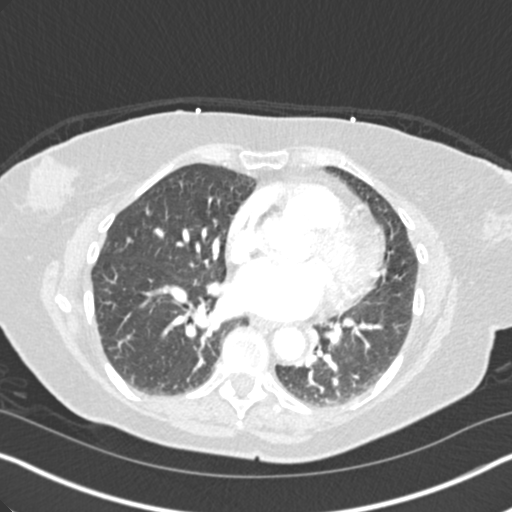
[im 172/343  soft-tissue]
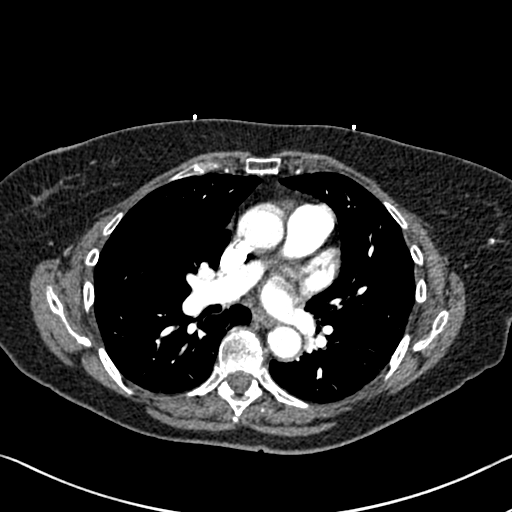
[im 200/343  lung]
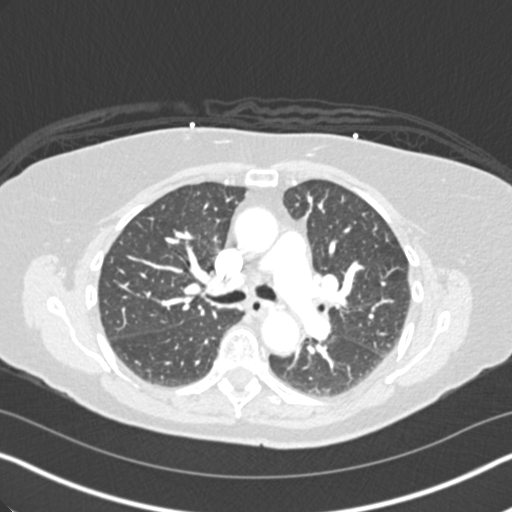
[im 214/343  soft-tissue]
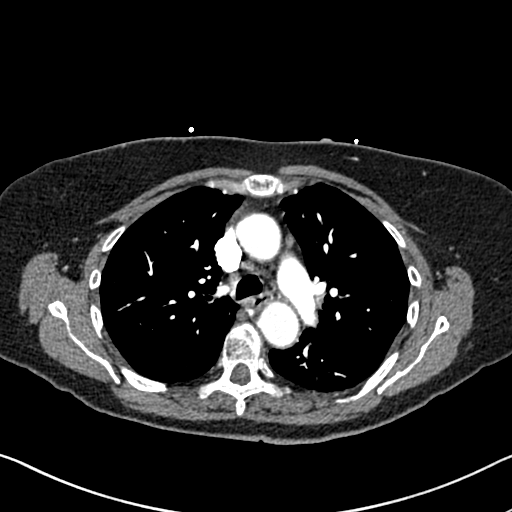
[im 243/343  lung]
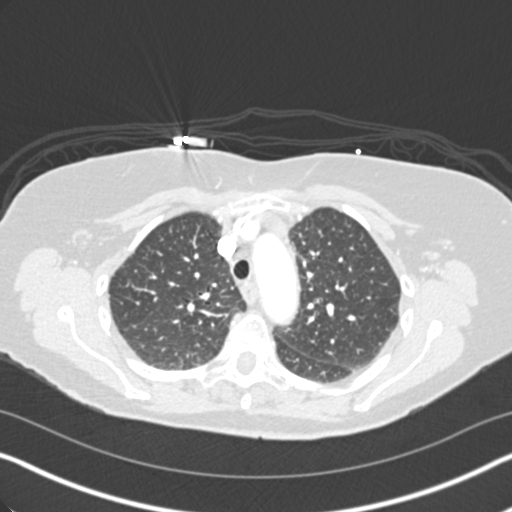
[im 257/343  soft-tissue]
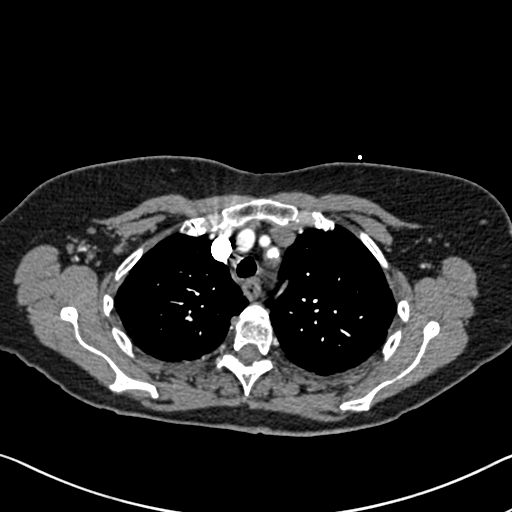
[im 286/343  lung]
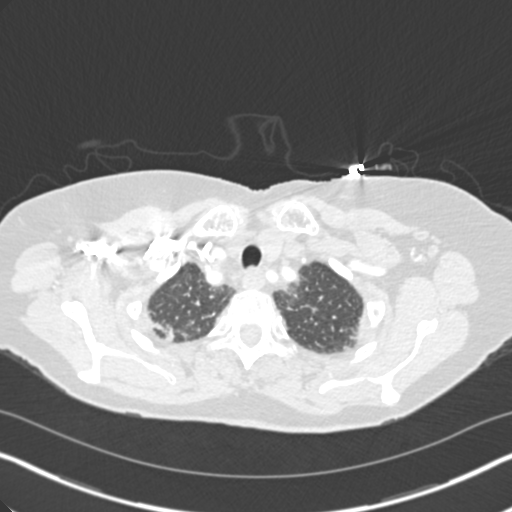
[im 300/343  soft-tissue]
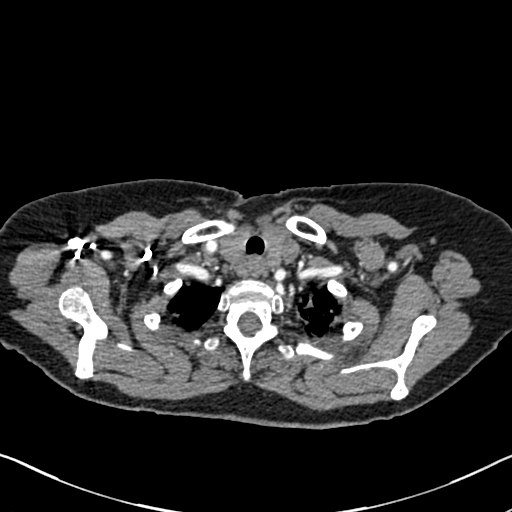
[im 328/343  lung]
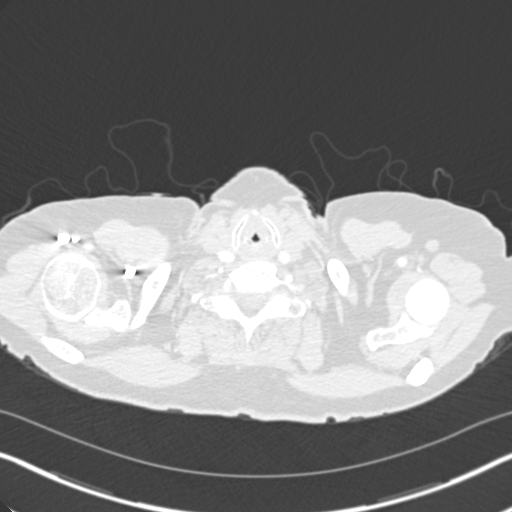

[Series 7: coronal mpr · coronal · 0.54mm/px · 3 of 135 slices shown]
[im 34/135  soft-tissue]
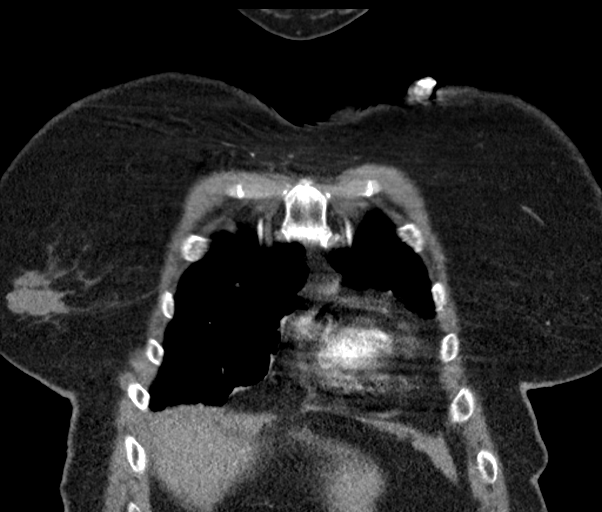
[im 68/135  soft-tissue]
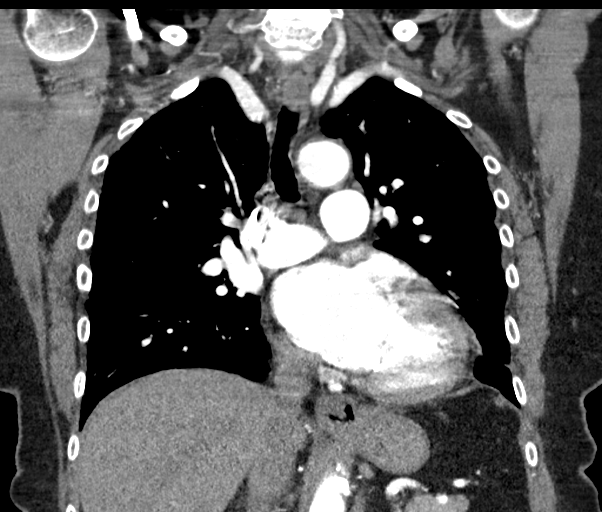
[im 101/135  soft-tissue]
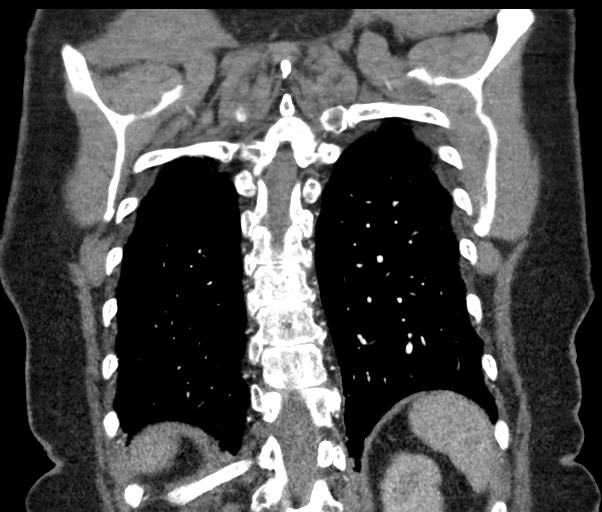

[18 of 46 positions shown; findings below may reference images not displayed]

FINDINGS: Cardiovascular: Heart size normal. No pericardial effusion.
Satisfactory opacification of pulmonary arteries noted, and there is
no evidence of pulmonary emboli. Aortic valve coarse calcifications.
Scattered coronary calcifications. Adequate contrast opacification
of the thoracic aorta with no evidence of dissection, aneurysm, or
stenosis. There is classic 3-vessel brachiocephalic arch anatomy
without proximal stenosis. Mild calcified plaque in the arch and
descending thoracic aorta as well as in the visualized proximal
abdominal aorta.

Mediastinum/Nodes: Small hiatal hernia. No mediastinal mass or
adenopathy.

Lungs/Pleura: No pleural effusion. No pneumothorax. 4 mm anterior
right upper lobe nodule adjacent to the minor fissure (Im51,Se6) .
lungs otherwise clear.

Upper Abdomen: 2.5 cm exophytic cyst from the upper pole left
kidney, previously 2.7 cm. No acute findings. Small hiatal hernia.

Musculoskeletal: Mild thoracic dextroscoliosis with spondylitic
changes in the lower thoracic spine. No fracture or worrisome bone
lesion.

Review of the MIP images confirms the above findings.
IMPRESSION: 1. Negative for acute PE or thoracic aortic dissection.
2. Coronary and Aortic Atherosclerosis (K9OOM-170.0).
3. 4 mm right solid pulmonary nodule within the upper lobe. If
patient is low risk for malignancy, no routine follow-up imaging is
recommended; if patient is high risk for malignancy, a non-contrast
Chest CT at 12 months is optional. If performed and the nodule is
stable at 12 months, no further follow-up is recommended. These
guidelines do not apply to immunocompromised patients and patients
with cancer. Follow up in patients with significant comorbidities as
clinically warranted. For lung cancer screening, adhere to Lung-RADS
guidelines. Reference: Radiology. 0604; 284(1):228-43.
